# Patient Record
Sex: Female | Born: 1965
Health system: Southern US, Community
[De-identification: ages and names within clinical notes are randomized; demographics above are authoritative.]

## PROBLEM LIST (undated history)

## (undated) DIAGNOSIS — K589 Irritable bowel syndrome without diarrhea: Secondary | ICD-10-CM

## (undated) DIAGNOSIS — Z8639 Personal history of other endocrine, nutritional and metabolic disease: Secondary | ICD-10-CM

## (undated) DIAGNOSIS — D649 Anemia, unspecified: Secondary | ICD-10-CM

## (undated) DIAGNOSIS — E669 Obesity, unspecified: Secondary | ICD-10-CM

## (undated) DIAGNOSIS — E042 Nontoxic multinodular goiter: Secondary | ICD-10-CM

## (undated) DIAGNOSIS — M1990 Unspecified osteoarthritis, unspecified site: Secondary | ICD-10-CM

## (undated) DIAGNOSIS — E039 Hypothyroidism, unspecified: Secondary | ICD-10-CM

## (undated) DIAGNOSIS — R442 Other hallucinations: Secondary | ICD-10-CM

## (undated) HISTORY — DX: Other hallucinations: R44.2

## (undated) HISTORY — DX: Unspecified osteoarthritis, unspecified site: M19.90

## (undated) HISTORY — PX: BREAST SURGERY: SHX581

## (undated) HISTORY — DX: Obesity, unspecified: E66.9

## (undated) HISTORY — DX: Personal history of other endocrine, nutritional and metabolic disease: Z86.39

## (undated) HISTORY — DX: Anemia, unspecified: D64.9

## (undated) HISTORY — PX: WISDOM TOOTH EXTRACTION: SHX21

## (undated) HISTORY — PX: COLONOSCOPY: SHX174

## (undated) HISTORY — DX: Hypothyroidism, unspecified: E03.9

## (undated) HISTORY — DX: Irritable bowel syndrome, unspecified: K58.9

## (undated) HISTORY — DX: Nontoxic multinodular goiter: E04.2

---

## 1998-01-08 ENCOUNTER — Emergency Department (HOSPITAL_COMMUNITY): Admission: EM | Admit: 1998-01-08 | Discharge: 1998-01-08 | Payer: Self-pay | Admitting: Emergency Medicine

## 1998-08-19 ENCOUNTER — Emergency Department (HOSPITAL_COMMUNITY): Admission: EM | Admit: 1998-08-19 | Discharge: 1998-08-19 | Payer: Self-pay | Admitting: Emergency Medicine

## 1998-12-23 ENCOUNTER — Other Ambulatory Visit: Admission: RE | Admit: 1998-12-23 | Discharge: 1998-12-23 | Payer: Self-pay | Admitting: Obstetrics and Gynecology

## 1999-06-14 ENCOUNTER — Emergency Department (HOSPITAL_COMMUNITY): Admission: EM | Admit: 1999-06-14 | Discharge: 1999-06-14 | Payer: Self-pay

## 2000-01-15 ENCOUNTER — Encounter (HOSPITAL_BASED_OUTPATIENT_CLINIC_OR_DEPARTMENT_OTHER): Payer: Self-pay | Admitting: General Surgery

## 2000-01-16 ENCOUNTER — Ambulatory Visit (HOSPITAL_COMMUNITY): Admission: RE | Admit: 2000-01-16 | Discharge: 2000-01-16 | Payer: Self-pay | Admitting: General Surgery

## 2000-01-16 ENCOUNTER — Encounter (INDEPENDENT_AMBULATORY_CARE_PROVIDER_SITE_OTHER): Payer: Self-pay

## 2000-02-19 ENCOUNTER — Other Ambulatory Visit: Admission: RE | Admit: 2000-02-19 | Discharge: 2000-02-19 | Payer: Self-pay | Admitting: Obstetrics and Gynecology

## 2001-07-28 ENCOUNTER — Encounter (HOSPITAL_BASED_OUTPATIENT_CLINIC_OR_DEPARTMENT_OTHER): Payer: Self-pay | Admitting: General Surgery

## 2001-07-28 ENCOUNTER — Ambulatory Visit (HOSPITAL_COMMUNITY): Admission: RE | Admit: 2001-07-28 | Discharge: 2001-07-28 | Payer: Self-pay | Admitting: General Surgery

## 2002-01-23 ENCOUNTER — Ambulatory Visit (HOSPITAL_COMMUNITY): Admission: RE | Admit: 2002-01-23 | Discharge: 2002-01-23 | Payer: Self-pay | Admitting: Internal Medicine

## 2002-01-23 ENCOUNTER — Encounter: Payer: Self-pay | Admitting: Internal Medicine

## 2002-12-21 ENCOUNTER — Emergency Department (HOSPITAL_COMMUNITY): Admission: EM | Admit: 2002-12-21 | Discharge: 2002-12-22 | Payer: Self-pay | Admitting: Emergency Medicine

## 2003-10-27 ENCOUNTER — Emergency Department (HOSPITAL_COMMUNITY): Admission: EM | Admit: 2003-10-27 | Discharge: 2003-10-27 | Payer: Self-pay | Admitting: Emergency Medicine

## 2004-07-17 ENCOUNTER — Emergency Department (HOSPITAL_COMMUNITY): Admission: EM | Admit: 2004-07-17 | Discharge: 2004-07-17 | Payer: Self-pay | Admitting: Emergency Medicine

## 2005-05-16 ENCOUNTER — Emergency Department (HOSPITAL_COMMUNITY): Admission: EM | Admit: 2005-05-16 | Discharge: 2005-05-16 | Payer: Self-pay | Admitting: Emergency Medicine

## 2005-07-28 ENCOUNTER — Ambulatory Visit: Payer: Self-pay | Admitting: Internal Medicine

## 2005-08-27 ENCOUNTER — Ambulatory Visit: Payer: Self-pay | Admitting: Internal Medicine

## 2005-12-08 ENCOUNTER — Ambulatory Visit: Payer: Self-pay | Admitting: Internal Medicine

## 2006-02-08 ENCOUNTER — Ambulatory Visit: Payer: Self-pay | Admitting: Internal Medicine

## 2006-09-23 ENCOUNTER — Ambulatory Visit: Payer: Self-pay | Admitting: Internal Medicine

## 2006-11-02 ENCOUNTER — Encounter: Payer: Self-pay | Admitting: *Deleted

## 2006-11-02 DIAGNOSIS — Z9189 Other specified personal risk factors, not elsewhere classified: Secondary | ICD-10-CM | POA: Insufficient documentation

## 2006-11-02 DIAGNOSIS — E032 Hypothyroidism due to medicaments and other exogenous substances: Secondary | ICD-10-CM | POA: Insufficient documentation

## 2006-11-02 DIAGNOSIS — K589 Irritable bowel syndrome without diarrhea: Secondary | ICD-10-CM | POA: Insufficient documentation

## 2006-11-02 DIAGNOSIS — J45909 Unspecified asthma, uncomplicated: Secondary | ICD-10-CM | POA: Insufficient documentation

## 2006-12-09 ENCOUNTER — Ambulatory Visit: Payer: Self-pay | Admitting: Internal Medicine

## 2007-04-07 ENCOUNTER — Encounter: Payer: Self-pay | Admitting: Internal Medicine

## 2007-12-05 ENCOUNTER — Ambulatory Visit: Payer: Self-pay | Admitting: Internal Medicine

## 2007-12-07 ENCOUNTER — Ambulatory Visit (HOSPITAL_BASED_OUTPATIENT_CLINIC_OR_DEPARTMENT_OTHER): Admission: RE | Admit: 2007-12-07 | Discharge: 2007-12-07 | Payer: Self-pay | Admitting: Internal Medicine

## 2007-12-07 ENCOUNTER — Ambulatory Visit: Payer: Self-pay | Admitting: Internal Medicine

## 2008-03-13 ENCOUNTER — Ambulatory Visit: Payer: Self-pay | Admitting: Internal Medicine

## 2008-03-13 DIAGNOSIS — M546 Pain in thoracic spine: Secondary | ICD-10-CM | POA: Insufficient documentation

## 2008-03-13 DIAGNOSIS — M12549 Traumatic arthropathy, unspecified hand: Secondary | ICD-10-CM | POA: Insufficient documentation

## 2008-03-13 LAB — CONVERTED CEMR LAB
BUN: 11 mg/dL (ref 6–23)
Chloride: 110 meq/L (ref 96–112)
Creatinine, Ser: 0.8 mg/dL (ref 0.4–1.2)
Free T4: 0.7 ng/dL (ref 0.6–1.6)
GFR calc Af Amer: 102 mL/min
GFR calc non Af Amer: 84 mL/min
Potassium: 3.3 meq/L — ABNORMAL LOW (ref 3.5–5.1)
Sodium: 142 meq/L (ref 135–145)
TSH: 30.92 microintl units/mL — ABNORMAL HIGH (ref 0.35–5.50)

## 2008-03-14 ENCOUNTER — Telehealth: Payer: Self-pay | Admitting: Internal Medicine

## 2008-10-29 ENCOUNTER — Ambulatory Visit: Payer: Self-pay | Admitting: Internal Medicine

## 2008-10-29 LAB — CONVERTED CEMR LAB
BUN: 9 mg/dL (ref 6–23)
Calcium: 8.8 mg/dL (ref 8.4–10.5)
Glucose, Bld: 84 mg/dL (ref 70–99)

## 2008-10-30 ENCOUNTER — Telehealth: Payer: Self-pay | Admitting: Internal Medicine

## 2008-11-30 ENCOUNTER — Ambulatory Visit: Payer: Self-pay | Admitting: Internal Medicine

## 2008-12-01 ENCOUNTER — Telehealth: Payer: Self-pay | Admitting: Internal Medicine

## 2008-12-25 ENCOUNTER — Ambulatory Visit (HOSPITAL_BASED_OUTPATIENT_CLINIC_OR_DEPARTMENT_OTHER): Admission: RE | Admit: 2008-12-25 | Discharge: 2008-12-25 | Payer: Self-pay | Admitting: Internal Medicine

## 2008-12-25 ENCOUNTER — Ambulatory Visit: Payer: Self-pay | Admitting: Diagnostic Radiology

## 2009-03-28 ENCOUNTER — Ambulatory Visit: Payer: Self-pay | Admitting: Internal Medicine

## 2009-04-05 ENCOUNTER — Telehealth: Payer: Self-pay | Admitting: Internal Medicine

## 2009-05-06 ENCOUNTER — Telehealth: Payer: Self-pay | Admitting: Internal Medicine

## 2009-05-14 ENCOUNTER — Ambulatory Visit: Payer: Self-pay | Admitting: Internal Medicine

## 2009-05-14 LAB — CONVERTED CEMR LAB: TSH: 0.999 microintl units/mL (ref 0.350–4.500)

## 2009-05-17 ENCOUNTER — Telehealth: Payer: Self-pay | Admitting: Internal Medicine

## 2009-07-05 ENCOUNTER — Ambulatory Visit: Payer: Self-pay | Admitting: Internal Medicine

## 2009-12-09 ENCOUNTER — Encounter: Payer: Self-pay | Admitting: Internal Medicine

## 2009-12-09 ENCOUNTER — Ambulatory Visit: Payer: Self-pay | Admitting: Family

## 2009-12-10 ENCOUNTER — Encounter: Payer: Self-pay | Admitting: Internal Medicine

## 2009-12-10 ENCOUNTER — Telehealth: Payer: Self-pay | Admitting: Internal Medicine

## 2009-12-10 LAB — CONVERTED CEMR LAB: Gardnerella vaginalis: NEGATIVE

## 2009-12-17 ENCOUNTER — Ambulatory Visit: Payer: Self-pay | Admitting: Internal Medicine

## 2010-01-29 ENCOUNTER — Ambulatory Visit
Admission: RE | Admit: 2010-01-29 | Discharge: 2010-01-29 | Payer: Self-pay | Source: Home / Self Care | Attending: Family | Admitting: Family

## 2010-01-29 ENCOUNTER — Telehealth: Payer: Self-pay | Admitting: Family

## 2010-01-29 ENCOUNTER — Ambulatory Visit (HOSPITAL_BASED_OUTPATIENT_CLINIC_OR_DEPARTMENT_OTHER)
Admission: RE | Admit: 2010-01-29 | Discharge: 2010-01-29 | Payer: Self-pay | Source: Home / Self Care | Attending: Internal Medicine | Admitting: Internal Medicine

## 2010-02-02 ENCOUNTER — Emergency Department (HOSPITAL_COMMUNITY)
Admission: EM | Admit: 2010-02-02 | Discharge: 2010-02-03 | Payer: Self-pay | Source: Home / Self Care | Admitting: Emergency Medicine

## 2010-02-02 HISTORY — PX: BREAST CYST EXCISION: SHX579

## 2010-02-23 ENCOUNTER — Encounter: Payer: Self-pay | Admitting: Internal Medicine

## 2010-02-25 ENCOUNTER — Ambulatory Visit (INDEPENDENT_AMBULATORY_CARE_PROVIDER_SITE_OTHER)
Admission: RE | Admit: 2010-02-25 | Discharge: 2010-02-25 | Payer: 59 | Source: Home / Self Care | Attending: Internal Medicine | Admitting: Internal Medicine

## 2010-02-25 DIAGNOSIS — Z1231 Encounter for screening mammogram for malignant neoplasm of breast: Secondary | ICD-10-CM

## 2010-03-06 NOTE — Progress Notes (Signed)
Summary: lab results  Phone Note Outgoing Call   Summary of Call: call pt - thyroid function is normal.  continue same dose of thyroid medication.  return in 6 months for repeat TSH Initial call taken by: D. Thomos Lemons DO,  May 17, 2009 5:11 PM  Follow-up for Phone Call        [facility] Follow-up by: Lannette Donath,  May 20, 2009 11:58 AM  Additional Follow-up for Phone Call Additional follow up Details #1::        [facility] Diane Tomerlin  May 21, 2009 12:47 PM    Additional Follow-up for Phone Call Additional follow up Details #2::    spoke with patient and advised result Follow-up by: Roselle Locus,  May 22, 2009 8:08 AM  New/Updated Medications: SYNTHROID 125 MCG TABS (LEVOTHYROXINE SODIUM) 2 tabs by mouth once daily [BMN] Prescriptions: SYNTHROID 125 MCG TABS (LEVOTHYROXINE SODIUM) 2 tabs by mouth once daily Brand medically necessary #60 x 5   Entered and Authorized by:   D. Thomos Lemons DO   Signed by:   D. Thomos Lemons DO on 05/17/2009   Method used:   Electronically to        [facility] [phone]* (retail)       [address]       [place], [place]  [address]       Ph: [phone]       Fax: [phone]   RxID:   [id number]

## 2010-03-06 NOTE — Progress Notes (Signed)
Summary: xray result  Phone Note Outgoing Call   Summary of Call: Please call patient and let her know that her back x-ray shows mild degenerative disc in her thoracic spine.  This is what I expected.  No changes in medical plan at this point.   Initial call taken by: Lemont Fillers FNP,  January 29, 2010 4:46 PM  Follow-up for Phone Call        Left message on machine to return my call. Nicki Guadalajara Fergerson CMA Duncan Dull)  January 29, 2010 5:00 PM   Additional Follow-up for Phone Call Additional follow up Details #1::        Pt notified. Nicki Guadalajara Fergerson CMA Duncan Dull)  January 30, 2010 8:41 AM

## 2010-03-06 NOTE — Progress Notes (Signed)
Summary: Medication Problems   Phone Note Call from Patient Call back at [phone]   Caller: Patient Call For: D. Thomos Lemons DO Reason for Call: Talk to Nurse Summary of Call: Pls call  [facility] IS AT [phone] Initial call taken by: Lannette Donath,  May 06, 2009 11:23 AM  Follow-up for Phone Call        call returned to patient at [phone], no answer, voice message left informing patient call was being returned Follow-up by: Glendell Docker CMA,  May 06, 2009 1:53 PM  Additional Follow-up for Phone Call Additional follow up Details #1::        returned your call pls call [phone]  Roselle Locus  May 07, 2009 11:02 AM     Additional Follow-up for Phone Call Additional follow up Details #2::    call return to patient she states that she is currently taking 2 thyroids pills at night and she has started to feel very sluggish and would like to know if there is anything she should do different Follow-up by: Glendell Docker CMA,  May 08, 2009 9:18 AM  Additional Follow-up for Phone Call Additional follow up Details #3:: Details for Additional Follow-up Action Taken: I suggest OV Additional Follow-up by: D. Thomos Lemons DO,  May 08, 2009 11:06 AM  patient was informed per Dr Artist Pais instructions. She states she already had a follow up with Dr Artist Pais and she wanted to know if there were any changes in her medication. She was informed no changes were made, Dr Artist Pais you advises office visit. Patient verbalized understanding and agrees Glendell Docker CMA  May 08, 2009 1:41 PM

## 2010-03-06 NOTE — Assessment & Plan Note (Signed)
Summary: 6 month follow up/mhf rsc with pt/mhf   Vital Signs:  Patient profile:   45 year old female Height:      67 inches Weight:      272 pounds BMI:     42.76 O2 Sat:      99 % on Room air Temp:     98.2 degrees F oral Pulse rate:   108 / minute Pulse rhythm:   irregular Resp:     18 per minute BP sitting:   130 / 90  (right arm) Cuff size:   regular  Vitals Entered By: Glendell Docker CMA (May 14, 2009 3:24 PM)  O2 Flow:  Room air CC: Rm 3- 6 Month follow up disease  management   Primary Care Provider:  DThomos Lemons DO  CC:  Rm 3- 6 Month follow up disease  management.  History of Present Illness:  45 y/o AA female for hypothyroidism for f/u prev TSH elevated pt notes good med compliance she denies missing doses currently taking 125 micrograms 2 in the am  ,  150 two times a day made her fell sluggish, so she stopped taking them and went back to 2 tablest of 125 in the am    Allergies: 1)  ! Sulfa  Past History:  Past Medical History: History of Multinodular Goiter S/P radioactive I131 Ablation Iatogenic HYPOTHYROIDISM (ICD-244.9) IRRITABLE BOWEL SYNDROME (ICD-564.1) Childhood ASTHMA (ICD-493.90)   BREAST BIOPSY, HX OF (ICD-V15.9)      Family History: Twin brother has sarcoidosis Family History Diabetes 1st degree relative        Social History: Single Never Smoked Alcohol use-no   Occupation: Works @ [employer]  General:  alert, well-developed, and well-nourished.   Neck:  supple and no masses.   Lungs:  normal respiratory effort and normal breath sounds.   Heart:  normal rate, regular rhythm, and no gallop.   Neurologic:  cranial nerves II-XII intact and gait normal.   Psych:  normally interactive and good eye contact.     Impression & Recommendations:  Problem # 1:  HYPOTHYROIDISM (ICD-244.9) Unclear whether poor absortion or compliance issue causing elevated TSH.   I stressed compliance. take at bedtime on empty  stomach repeat TFTs in 2 months  Complete Medication List: 1)  Synthroid 125 Mcg Tabs (Levothyroxine sodium) .... 2 tabs by mouth once daily 2)  Cyclobenzaprine Hcl 5 Mg Tabs (Cyclobenzaprine hcl) .... One by mouth at bedtime prn  Other Orders: T-TSH ([id number]) T-T4, Free [phone])  Patient Instructions: 1)  Please schedule a follow-up appointment in 6 months.  Current Allergies (reviewed today): ! SULFA

## 2010-03-06 NOTE — Assessment & Plan Note (Signed)
Summary: pap/mhf rsch per pt/dt--Rm 5   Vital Signs:  Patient profile:   45 year old female Menstrual status:  regular LMP:     12/08/2009 Height:      67 inches Weight:      272.75 pounds BMI:     42.87 Temp:     99.1 degrees F oral Pulse rate:   106 / minute Pulse rhythm:   regular Resp:     18 per minute BP sitting:   130 / 90  (right arm) Cuff size:   large  Vitals Entered By: Mervin Kung CMA Duncan Dull) (December 09, 2009 10:03 AM) CC: Rm 5  Pt here for pap smear and thyroid  check. Is Patient Diabetic? No Pain Assessment Patient in pain? no      Comments Pt states she had more menstrual cramping than usual last week. Nicki Guadalajara Fergerson CMA Duncan Dull)  December 09, 2009 10:09 AM  LMP (date): 12/08/2009     Menstrual Status regular Enter LMP: 12/08/2009   Primary Care Winta Barcelo:  D. Thomos Lemons DO  CC:  Rm 5  Pt here for pap smear and thyroid  check..  History of Present Illness: Ms.  Ploch is a 45 year old female patient of Dr. Olegario Messier who presents today for her pap smear.   1.  GYN-  Last cycle lasted 3 days, more cramping than usual.  Passed a "large" clot which is unusual for her.  Has never been sexually active.  Last pap was 4 years ago.  She does c/o vaginal odor, some brownish discharge noted last week prior to her period.  She is also due for a mammogram.    2. Hypothyroid-  Last lab draw TSH was within normal limits.  (4/11)  Allergies: 1)  ! Sulfa  Past History:  Past Medical History: Last updated: 05/14/2009 History of Multinodular Goiter S/P radioactive I131 Ablation Iatogenic HYPOTHYROIDISM (ICD-244.9) IRRITABLE BOWEL SYNDROME (ICD-564.1) Childhood ASTHMA (ICD-493.90)   BREAST BIOPSY, HX OF (ICD-V15.9)      Past Surgical History: Last updated: 10/29/2008 Right Breast Biopsy History of surgery for Uterine Fibroids    Review of Systems       see HPI  Physical Exam  General:  Obese AA female, awake, alert and in NAD Breasts:  No mass,  nodules, thickening, tenderness, bulging, retraction, inflamation, nipple discharge or skin changes noted.  + scar on right breast at 10 oclock Lungs:  Normal respiratory effort, chest expands symmetrically. Lungs are clear to auscultation, no crackles or wheezes. Heart:  Normal rate and regular rhythm. S1 and S2 normal without gallop, murmur, click, rub or other extra sounds. Abdomen:  Bowel sounds positive,abdomen soft and non-tender without masses, organomegaly or hernias noted. Genitalia:  Pelvic Exam:        External: normal female genitalia without lesions or masses        Vagina:narrow introitus without lesions or masses, blood tinged mucous noted        Cervix: not visualized        Adnexa: normal bimanual exam without masses or fullness        Uterus: normal by palpation        Pap smear: not performed   Impression & Recommendations:  Problem # 1:  ROUTINE GYNECOLOGICAL EXAMINATION (ICD-V72.31) Assessment Comment Only  Attempted Pap smear on this virginal female without success due to extreme patient discomfort- speculum was passed, however unable to open speculum enough to bring cervix into view as patient was  very tense and was extremely uncomfomfortable during exam.  Discussed with patient.  Will refer to GYN- they may be able to perform successfully with an extra small speculum.  A wet prep was performed today to evaluate patients complaint of vaginal odor.  Patient was given order for Mammogram to complete downstairs.  Orders: Specimen Handling ([id number]) T-Wet Prep by [facility] [phone]) Gynecologic Referral (Gyn)  Problem # 2:  HYPOTHYROIDISM (ICD-244.9) Assessment: Comment Only Will send for TSH. Her updated medication list for this problem includes:    Synthroid 125 Mcg Tabs (Levothyroxine sodium) .Marland Kitchen... 2 tabs by mouth once daily  Complete Medication List: 1)  Synthroid 125 Mcg Tabs (Levothyroxine sodium) .... 2 tabs by mouth once daily 2)  Cyclobenzaprine  Hcl 5 Mg Tabs (Cyclobenzaprine hcl) .... One by mouth at bedtime prn  Other Orders: Mammogram (Screening) (Mammo) T- * Misc. Laboratory test [phone])  Patient Instructions: 1)  You will be contacted about your apt with GYN. 2)  Please complete schedule your mammogram downstairs. 3)  Please complete your lab work downstairs today. 4)  Follow up with Dr. Artist Pais in 3 months.   Orders Added: 1)  Mammogram (Screening) [Mammo] 2)  Specimen Handling [99000] 3)  T-Wet Prep by [facility] [[id number]] 4)  T- * Misc. Laboratory test [99999] 5)  Est. Patient Level III [29518] 6)  Gynecologic Referral [Gyn]    Current Allergies (reviewed today): ! SULFA

## 2010-03-06 NOTE — Assessment & Plan Note (Signed)
Summary: BACK PAIN/MHF--Rm 5   Vital Signs:  Patient profile:   45 year old female Menstrual status:  regular Height:      67 inches Weight:      274.75 pounds BMI:     43.19 O2 Sat:      100 % on Room air Temp:     97.9 degrees F oral Pulse rate:   84 / minute Pulse rhythm:   regular Resp:     18 per minute BP sitting:   130 / 80  (right arm) Cuff size:   large  Vitals Entered By: Mervin Kung CMA Duncan Dull) (January 29, 2010 2:31 PM)  O2 Flow:  Room air CC: Pt states she has had mid back pain x 1 week. Some relief  with otc Ibuprofen. Pain resumes when medication wears off., Back Pain Is Patient Diabetic? No Pain Assessment Patient in pain? yes     Location: mid back pain Intensity: 9 Type: cramping Onset of pain  1 week Comments Pt has been taking Ibuprofen 200mg  1 four times a day. Nicki Guadalajara Fergerson CMA Duncan Dull)  January 29, 2010 2:40 PM    CC:  Pt states she has had mid back pain x 1 week. Some relief  with otc Ibuprofen. Pain resumes when medication wears off. and Back Pain.  Back Pain History:      The patient's back pain started approximately 01/22/2010.  The pain is located in the lower back region and does not radiate below the knees.  On a scale of 1-10, she describes the pain as a 4.  She states that she has had a prior history of back pain.  The patient has not had any recent physical therapy for her back pain.  The following makes the back pain better: Ibuprofen.  The following makes the back pain worse: Twisting movement.        Description of injury in patient's own words:  Denies know injury.  Works in lab- always moves.   .    Critical Exclusionary Diagnosis Criteria (CEDC) for Back Pain:      The patient denies a history of previous trauma.     Allergies: 1)  ! Sulfa  Review of Systems       see history of present illness  Physical Exam  General:  overweight African American female pleasant, awake, alert, and in no acute distress Neck:  No  deformities, masses, or tenderness noted. Lungs:  Normal respiratory effort, chest expands symmetrically. Lungs are clear to auscultation, no crackles or wheezes. Heart:  Normal rate and regular rhythm. S1 and S2 normal without gallop, murmur, click, rub or other extra sounds.   Detailed Back/Spine Exam  Thoracic Exam:  Inspection-deformity:    Normal Palpation-spinal tenderness:  Abnormal    Location:  T10-T11   Impression & Recommendations:  Problem # 1:  BACK PAIN, THORACIC REGION, RIGHT (ICD-724.1) Assessment Deteriorated Will order plain film to further evaluate. And give patient a trial of Mobic. Patient to followup with Dr. Artist Pais in one month. Her updated medication list for this problem includes:    Meloxicam 7.5 Mg Tabs (Meloxicam) ..... One tablet by mouth once daily as needed for back pain  Orders: T-DG Thoracic Spine (86578)  Complete Medication List: 1)  Levothyroxine Sodium 100 Mcg Tabs (Levothyroxine sodium) .Marland Kitchen.. 1 tab as directed 2)  Meloxicam 7.5 Mg Tabs (Meloxicam) .... One tablet by mouth once daily as needed for back pain  Other Orders: Urine Pregnancy Test  [phone])  Patient Instructions: 1)  Please complete your x-ray downstairs. 2)  Stop motrin/ibuprofen. Start Mobic. 3)  Most patients (90%) with low back pain will improve with time (2-6 weeks). Keep active but avoid activities that are painful. Apply moist heat and/or ice to lower back several times a day. 4)  Follow up with Dr. Artist Pais in 1 month. Prescriptions: MELOXICAM 7.5 MG TABS (MELOXICAM) one tablet by mouth once daily as needed for back pain  #15 x 0   Entered and Authorized by:   Lemont Fillers FNP   Signed by:   Lemont Fillers FNP on 01/29/2010   Method used:   Electronically to        [facility] [phone]* (retail)       [address]       [place], [place]  [address]       Ph: [phone]       Fax: [phone]   RxID:   [id number]    Orders Added: 1)  T-DG  Thoracic Spine [72070] 2)  Urine Pregnancy Test  [81025] 3)  Est. Patient Level III [24401]    Current Allergies (reviewed today): ! SULFA   Laboratory Results   Urine Tests   Date/Time Reported: Mervin Kung CMA Duncan Dull)  January 29, 2010 3:21 PM     Urine HCG: negative

## 2010-03-06 NOTE — Assessment & Plan Note (Signed)
Summary: THYROID CHECK/DK   Vital Signs:  Patient profile:   45 year old female Menstrual status:  regular Height:      67 inches Weight:      275 pounds BMI:     43.23 O2 Sat:      97 % on Room air Temp:     98.1 degrees F oral Pulse rate:   91 / minute Resp:     18 per minute BP sitting:   130 / 80  (right arm) Cuff size:   large  Vitals Entered By: Glendell Docker CMA (December 17, 2009 2:32 PM)  O2 Flow:  Room air CC: follow-up visit Is Patient Diabetic? No Pain Assessment Patient in pain? no      Comments discuss thyroid condition   Primary Care Provider:  Dondra Spry DO  CC:  follow-up visit.  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 Sulfa  Past History:  Past Medical History: History of Multinodular Goiter S/P radioactive I131 Ablation  Iatogenic HYPOTHYROIDISM (ICD-244.9) IRRITABLE BOWEL SYNDROME (ICD-564.1) Childhood ASTHMA (ICD-493.90)   BREAST BIOPSY, HX OF (ICD-V15.9)       Past Surgical History: Right Breast Biopsy  History of surgery for Uterine Fibroids     Family History: Twin brother has sarcoidosis Family History Diabetes 1st degree relative         Social History: Single Never Smoked  Alcohol use-no   Occupation: Works @ [employer]  General:  alert, well-developed, and well-nourished.   Lungs:  normal respiratory effort and normal breath sounds.   Heart:  normal rate, regular rhythm, and no gallop.   Extremities:  No lower extremity edema   Impression & Recommendations:  Problem # 1:  HYPOTHYROIDISM (ICD-244.9) most recent TSH elevated >50 pt with sporadic compliance medication cost is an issue change to generic levothyroxine arrange TSH in 2 months  Her  updated medication list for this problem includes:    Levothyroxine Sodium 100 Mcg Tabs (Levothyroxine sodium) .Marland Kitchen... 1 tab as directed  Labs Reviewed: TSH: 0.999 (05/14/2009)     Complete Medication List: 1)  Levothyroxine Sodium 100 Mcg Tabs (Levothyroxine sodium) .Marland Kitchen.. 1 tab as directed  Patient Instructions: 1)  Please schedule a follow-up appointment in 4 months. 2)  TSH prior to visit, ICD-9:  244.90 3)  Please return for lab work in 2 months Prescriptions: LEVOTHYROXINE SODIUM 100 MCG TABS (LEVOTHYROXINE SODIUM) 1 tab as directed  #90 x 1   Entered and Authorized by:   D. Thomos Lemons DO   Signed by:   D. Thomos Lemons DO on 12/17/2009   Method used:   Electronically to        [facility] [phone]* (retail)       [address]       [place], [place]  [address]       Ph: [phone]       Fax: [phone]   RxID:   [id number]    Orders Added: 1)  Est. Patient Level II [56387]    Current Allergies (reviewed today): ! SULFA     Appended Document: Orders Update    Clinical Lists Changes  Orders: Added new Service order of Flu Vaccine 2yrs + (56433) -  Signed Added new Service order of Admin 1st Vaccine (60454) - Signed Observations: Added new observation of FLU VAX#1VIS: 08/27/09 version given December 17, 2009. (12/17/2009 14:58) Added new observation of FLU VAXLOT: [id number] (12/17/2009 14:58) Added new observation of FLU VAX EXP: 08/02/2010 (12/17/2009 14:58) Added new observation of FLU VAXBY: Darlene Knight CMA (12/17/2009 14:58) Added new observation of FLU VAXRTE: IM (12/17/2009 14:58) Added new observation of FLU VAX DSE: 0.5 ml (12/17/2009 14:58) Added new observation of FLU VAXMFR: GlaxoSmithKline (12/17/2009 14:58) Added new observation of FLU VAX SITE: left deltoid (12/17/2009 14:58) Added new observation of FLU VAX: Fluvax 3+ (12/17/2009 14:58)       Immunizations Administered:  Influenza Vaccine # 1:    Vaccine Type: Fluvax 3+     Site: left deltoid    Mfr: GlaxoSmithKline    Dose: 0.5 ml    Route: IM    Given by: Glendell Docker CMA    Exp. Date: 08/02/2010    Lot #: [id number]    VIS given: 08/27/09 version given December 17, 2009.  Flu Vaccine Consent Questions:    Do you have a history of severe allergic reactions to this vaccine? no    Any prior history of allergic reactions to egg and/or gelatin? no    Do you have a sensitivity to the preservative Thimersol? no    Do you have a past history of Guillan-Barre Syndrome? no    Do you currently have an acute febrile illness? no    Have you ever had a severe reaction to latex? no    Vaccine information given and explained to patient? yes    Are you currently pregnant? no

## 2010-03-06 NOTE — Progress Notes (Signed)
Summary: Thyroid Status pt states she is taking her meds as directed   Phone Note Outgoing Call   Call placed by: Glendell Docker CMA,  April 05, 2009 5:15 PM Call placed to: Patient Summary of Call: attempted to contact patient at [phone], patients daughter stated patient was not home. Message left for patient to return phone call first thing Monday morning.  Patients TSH is elevated  42.200 and we need to find out if she has been taking her medication on a regular basis. Initial call taken by: Glendell Docker CMA,  April 05, 2009 5:18 PM  Follow-up for Phone Call        patient called she states she is taking her meds as directed. [phone] is a number you can reach her today Roselle Locus  April 08, 2009 8:23 AM  Additional Follow-up for Phone Call Additional follow up Details #1::        see rx for higher dose.  take thyroid medication at night.  she needs TSH in 1 month  244.9 Additional Follow-up by: D. Thomos Lemons DO,  April 08, 2009 1:48 PM    Additional Follow-up for Phone Call Additional follow up Details #2::    attempted to contact patient at [phone], no answer, detailed voice message left informing patient oer Dr Artist Pais instructions. Lab order has been enetered for 4/4/ 2011 at the [facility]. Follow-up by: Glendell Docker CMA,  April 08, 2009 2:01 PM  New/Updated Medications: LEVOTHROID 150 MCG TABS (LEVOTHYROXINE SODIUM) 2 tabs by mouth qpm Prescriptions: LEVOTHROID 150 MCG TABS (LEVOTHYROXINE SODIUM) 2 tabs by mouth qpm  #60 x 2   Entered and Authorized by:   D. Thomos Lemons DO   Signed by:   D. Thomos Lemons DO on 04/08/2009   Method used:   Electronically to        [facility] [phone]* (retail)       [address]       [place], [place]  [address]       Ph: [phone]       Fax: [phone]   RxID:   [id number]

## 2010-03-06 NOTE — Progress Notes (Signed)
Summary: Thyroid  ---- Converted from flag ---- ---- 12/10/2009 4:39 PM, D. Thomos Lemons DO wrote: what other pharm is she using?  ---- 12/10/2009 4:00 PM, Glendell Docker CMA wrote: Sherron Monday with the pharmacy tech at [facility], she states last refill was in July and patient transferred the medication in September.  ---- 12/10/2009 2:41 PM, D. Thomos Lemons DO wrote: please call her pharm and find out whether pt has been refilling her thyroid medication regularly ------------------------------  Phone Note Outgoing Call   Call placed by: Glendell Docker CMA,  December 10, 2009 5:03 PM Call placed to: Patient Summary of Call: call placed to she states that she has been taking her medication on a regular basis. She was informed that Dr Artist Pais would like to see her back in the office to discuss her thyroid and review medication with her. Patient has scheduled a follow up appointment for Tuesday 11/15 @ 2:30pm with Dr Artist Pais Initial call taken by: Glendell Docker CMA,  December 10, 2009 5:04 PM

## 2010-03-13 ENCOUNTER — Ambulatory Visit (INDEPENDENT_AMBULATORY_CARE_PROVIDER_SITE_OTHER): Payer: 59 | Admitting: Internal Medicine

## 2010-03-13 ENCOUNTER — Encounter: Payer: Self-pay | Admitting: Internal Medicine

## 2010-03-13 ENCOUNTER — Ambulatory Visit (INDEPENDENT_AMBULATORY_CARE_PROVIDER_SITE_OTHER)
Admission: RE | Admit: 2010-03-13 | Discharge: 2010-03-13 | Disposition: A | Discharge status: Home / Self Care | Payer: 59 | Source: Ambulatory Visit | Attending: Internal Medicine | Admitting: Internal Medicine

## 2010-03-13 ENCOUNTER — Other Ambulatory Visit: Payer: Self-pay | Admitting: Internal Medicine

## 2010-03-13 ENCOUNTER — Ambulatory Visit (HOSPITAL_BASED_OUTPATIENT_CLINIC_OR_DEPARTMENT_OTHER)
Admission: RE | Admit: 2010-03-13 | Discharge: 2010-03-13 | Disposition: A | Discharge status: Home / Self Care | Payer: 59 | Source: Ambulatory Visit | Attending: Internal Medicine | Admitting: Internal Medicine

## 2010-03-13 DIAGNOSIS — M79609 Pain in unspecified limb: Secondary | ICD-10-CM | POA: Insufficient documentation

## 2010-03-13 DIAGNOSIS — IMO0002 Reserved for concepts with insufficient information to code with codable children: Secondary | ICD-10-CM | POA: Insufficient documentation

## 2010-03-13 DIAGNOSIS — M25469 Effusion, unspecified knee: Secondary | ICD-10-CM | POA: Insufficient documentation

## 2010-03-13 DIAGNOSIS — M79604 Pain in right leg: Secondary | ICD-10-CM

## 2010-03-13 DIAGNOSIS — M1710 Unilateral primary osteoarthritis, unspecified knee: Secondary | ICD-10-CM | POA: Insufficient documentation

## 2010-03-13 DIAGNOSIS — E039 Hypothyroidism, unspecified: Secondary | ICD-10-CM

## 2010-03-14 ENCOUNTER — Telehealth: Payer: Self-pay | Admitting: Internal Medicine

## 2010-03-14 ENCOUNTER — Encounter: Payer: Self-pay | Admitting: Internal Medicine

## 2010-03-17 ENCOUNTER — Encounter (INDEPENDENT_AMBULATORY_CARE_PROVIDER_SITE_OTHER): Payer: Self-pay | Admitting: *Deleted

## 2010-03-20 ENCOUNTER — Telehealth: Payer: Self-pay | Admitting: Internal Medicine

## 2010-03-22 ENCOUNTER — Encounter: Payer: Self-pay | Admitting: Internal Medicine

## 2010-03-26 NOTE — Letter (Signed)
Summary: Primary Care Consult Scheduled Letter  [place] [facility]  [address]   [place], [place] [address]   Phone: [phone]  Fax: [phone]      03/17/2010 MRN: [id number]  [facility] [address] [place], [place]  [address]  [place]    Dear Ms. Murdaugh,      We have scheduled an appointment for you.  At the recommendation of Dr.YOO, we have scheduled you a consult with DR GIOFFRE,[facility]  on SATURDAY FEBRUARY 18,2012 at 10:30AM.  Their address is_[address], [place] . The office phone number is [phone].  If this appointment day and time is not convenient for you, please feel free to call the office of the doctor you are being referred to at the number listed above and reschedule the appointment.     It is important for you to keep your scheduled appointments. We are here to make sure you are given good patient care.    Thank you,  Darral Dash Patient Care Coordinator [place] at [facility]

## 2010-03-26 NOTE — Progress Notes (Signed)
Summary: Test Results  Phone Note Outgoing Call   Summary of Call: call pt - leg u/s negative for blood clot.  it showed bakers cyst.  (benign synovial cyst) knee xray showed arthritis. If pt having persistent leg pain, I suggest referral to ortho  I doubt symptoms from restless leg.  ok to stop gabapentin Initial call taken by: D. Thomos Lemons DO,  March 14, 2010 1:36 PM  Follow-up for Phone Call        call placed to patient at [phone], no answer. A detailed voice message was left informing patient per Dr Artist Pais instructions. Message was left for patient to return call if any questions Follow-up by: Glendell Docker CMA,  March 14, 2010 2:04 PM

## 2010-03-26 NOTE — Progress Notes (Signed)
Summary: Lab Results  Phone Note Outgoing Call   Call placed by: Glendell Docker CMA,  March 20, 2010 8:21 AM Call placed to: Patient Summary of Call: call placed to patient at [phone], no answer, A detailed voice message was left for patient to return call regarding lab results. Need to verify if patient is taking her Thyroid medication on a regaular basis. TSH is elevated at 67.520 Initial call taken by: Glendell Docker CMA,  March 20, 2010 8:23 AM  Follow-up for Phone Call        call placed to patient at [phone], no answer. A detailed voice message was left for patient informing patient TSH was elevated. Message left for patient to return call to inform if she is taking her thyroid medication, and if so; is she taking it on a regular basis. Follow-up by: Glendell Docker CMA,  March 21, 2010 9:12 AM  Additional Follow-up for Phone Call Additional follow up Details #1::        patient returned phone call and stated that she has been taking her medication faithfully once in the morning with the Glucasamine. She denies missing any doses. Additional Follow-up by: Glendell Docker CMA,  March 21, 2010 4:04 PM    Additional Follow-up for Phone Call Additional follow up Details #2::    I suggest pt take thyroid medication on empty stomach with water only wait 2 hrs before eating breakfast If this is not possible,  take thyroid medication at bedtime as long as she does not eat within 2 hrs of bedtime. see higher dose of thyroid medication plz schedule repeat TSH in 2 months  Follow-up by: D. Thomos Lemons DO,  March 21, 2010 4:28 PM  Additional Follow-up for Phone Call Additional follow up Details #3:: Details for Additional Follow-up Action Taken: call placed to patient at [phone], no answer. A detailed voice message was left informing patient per Dr Artist Pais instructions. She was advised to return in April for repeat TSH. Message was left for patient to return phone call if any  questions. Additional Follow-up by: Glendell Docker CMA,  March 21, 2010 4:41 PM  New/Updated Medications: LEVOTHYROXINE SODIUM 150 MCG TABS (LEVOTHYROXINE SODIUM) one by mouth once daily as directed Prescriptions: LEVOTHYROXINE SODIUM 150 MCG TABS (LEVOTHYROXINE SODIUM) one by mouth once daily as directed  #30 x 3   Entered and Authorized by:   D. Thomos Lemons DO   Signed by:   D. Thomos Lemons DO on 03/21/2010   Method used:   Electronically to        [facility] [phone]* (retail)       [address]       [place], [place]  [address]       Ph: [phone]       Fax: [phone]   RxID:   [id number]

## 2010-04-01 NOTE — Assessment & Plan Note (Signed)
Summary: 2 month follow up/mhf   Vital Signs:  Patient profile:   45 year old female Menstrual status:  regular Height:      67 inches Weight:      272.50 pounds BMI:     42.83 O2 Sat:      100 % on Room air Temp:     99.1 degrees F oral Pulse rate:   85 / minute Resp:     18 per minute BP sitting:   122 / 80  (right arm) Cuff size:   large  Vitals Entered By: Glendell Docker CMA (March 13, 2010 3:38 PM)  O2 Flow:  Room air CC: 2 Monrh follow up  Is Patient Diabetic? No Pain Assessment Patient in pain? no        Primary Care Provider:  Dondra Spry DO  CC:  2 Monrh follow up .  History of Present Illness: 46 y/o female c/o bilateral leg pain, throbbing sensation worse at night when she is sleeping, has started wearing support hose with some relief, also has a popping sensation in her knees.  hypothyroidism - pt denies poor compliance  Preventive Screening-Counseling & Management  Alcohol-Tobacco     Smoking Status: never  Allergies: 1)  ! Sulfa  Past History:  Past Medical History: History of Multinodular Goiter S/P radioactive I131 Ablation  Iatogenic HYPOTHYROIDISM (ICD-244.9) IRRITABLE BOWEL SYNDROME (ICD-564.1)  Childhood ASTHMA (ICD-493.90)   BREAST BIOPSY, HX OF (ICD-V15.9)       Past Surgical History: Right Breast Biopsy  History of surgery for Uterine Fibroids       Physical Exam  General:  alert and overweight-appearing.   Lungs:  Normal respiratory effort, chest expands symmetrically. Lungs are clear to auscultation, no crackles or wheezes. Heart:  Normal rate and regular rhythm. S1 and S2 normal without gallop, murmur, click, rub or other extra sounds. Msk:  prominent popliteal fossa Extremities:  trace left pedal edema and trace right pedal edema.       Impression & Recommendations:  Problem # 1:  LEG PAIN, RIGHT (ICD-729.5) rule out DVT consider RLS  -  trial of gabapentin  Orders: LE Venous Duplex (DVT) (DVT) T-CBC No  Diff (04540-98119) T-Iron Binding Capacity (TIBC) ([address]) Augusto Gamble ([address]86-57846) T-Ferritin (96295-28413) T-Knee Right 2 view (73560TC)  Problem # 2:  HYPOTHYROIDISM (ICD-244.9)  Her updated medication list for this problem includes:    Levothyroxine Sodium 150 Mcg Tabs (Levothyroxine sodium) ..... One by mouth once daily as directed  Orders: T-TSH (24401-02725)  Complete Medication List: 1)  Levothyroxine Sodium 150 Mcg Tabs (Levothyroxine sodium) .... One by mouth once daily as directed  Patient Instructions: 1)  Please schedule a follow-up appointment in 1 month. Prescriptions: GABAPENTIN 100 MG CAPS (GABAPENTIN) one to two caps at bedtime for leg pains  #60 x 0   Entered and Authorized by:   D. Thomos Lemons DO   Signed by:   D. Thomos Lemons DO on 03/13/2010   Method used:   Electronically to        [facility] [phone]* (retail)       [address]       [place], [place]  [address]       Ph: [phone]       Fax: [phone]   RxID:   [id number]    Orders Added: 1)  LE Venous Duplex (DVT) [DVT] 2)  T-CBC No Diff [[address]27[address]10000] 3)  T-Iron Binding Capacity (TIBC) [[address]] 4)  Augusto Gamble [54270-62376]  5)  T-Ferritin [82728-23350] 6)  T-TSH [16109-60454] 7)  T-Knee Right 2 view [73560TC] 8)  Est. Patient Level III [[address]11]    Current Allergies (reviewed today): ! SULFA

## 2010-04-10 NOTE — Consult Note (Signed)
Summary: [facility]   Imported By: Maryln Gottron 04/02/2010 15:41:43  _____________________________________________________________________  External Attachment:    Type:   Image     Comment:   External Document

## 2010-04-14 ENCOUNTER — Ambulatory Visit: Payer: 59 | Admitting: Internal Medicine

## 2010-04-17 ENCOUNTER — Ambulatory Visit: Payer: Self-pay | Admitting: Internal Medicine

## 2010-05-06 ENCOUNTER — Encounter: Payer: Self-pay | Admitting: Family

## 2010-05-07 ENCOUNTER — Ambulatory Visit (INDEPENDENT_AMBULATORY_CARE_PROVIDER_SITE_OTHER): Payer: 59 | Admitting: Family

## 2010-05-07 ENCOUNTER — Encounter: Payer: Self-pay | Admitting: Family

## 2010-05-07 DIAGNOSIS — K5900 Constipation, unspecified: Secondary | ICD-10-CM | POA: Insufficient documentation

## 2010-05-07 NOTE — Progress Notes (Signed)
  Subjective:    Patient ID: [name], female    DOB: 1965-07-20, 45 y.o.   MRN: [id number]  HPI  [name] is a 45 yr old female who presents today with chief complaint of rectal fullness.  Symptoms started on Friday 3/30 after working out on an exercise bike.  Denies pmhx of hemorroids.  Notes + rectal tenderness with squeezing.  Has used preparation H on Sunday and Monday without improvement in her symptoms.    Review of Systems  Gastrointestinal: Positive for abdominal distention and rectal pain. Negative for blood in stool.       Objective:   Physical Exam  Constitutional: She appears well-developed and well-nourished.  Cardiovascular: Normal rate and regular rhythm.   Pulmonary/Chest: Effort normal and breath sounds normal.  Abdominal: Soft. Bowel sounds are normal.       Normal rectal exam- no visible external hemorrhoids.  No palpable internal hemorrhoids.  + hard stool noted in rectal vault.           Assessment & Plan:

## 2010-05-07 NOTE — Patient Instructions (Signed)
Eat plenty of fresh fruits and veggies and make sure that you are drinking 6-8 glasses of water a day. Use miralax once daily as needed to achieve one soft BM a day. Call if symptoms worsen or do not improve.

## 2010-05-07 NOTE — Assessment & Plan Note (Signed)
Symptoms most consistent with constipation.  Recommended that she start Miralax once daily and then taper to an as needed regimen to maintain one soft BM/day.  Increase fiber and H20 intake.  Pt verbalizes understanding.

## 2010-05-29 ENCOUNTER — Encounter: Payer: Self-pay | Admitting: Internal Medicine

## 2010-05-29 ENCOUNTER — Ambulatory Visit (INDEPENDENT_AMBULATORY_CARE_PROVIDER_SITE_OTHER): Payer: 59 | Admitting: Internal Medicine

## 2010-05-29 DIAGNOSIS — E039 Hypothyroidism, unspecified: Secondary | ICD-10-CM

## 2010-05-29 DIAGNOSIS — R209 Unspecified disturbances of skin sensation: Secondary | ICD-10-CM

## 2010-05-29 DIAGNOSIS — R202 Paresthesia of skin: Secondary | ICD-10-CM

## 2010-05-29 LAB — T4, FREE: Free T4: 0.93 ng/dL (ref 0.80–1.80)

## 2010-05-29 LAB — TSH: TSH: 23.279 u[IU]/mL — ABNORMAL HIGH (ref 0.350–4.500)

## 2010-05-30 ENCOUNTER — Telehealth: Payer: Self-pay | Admitting: Internal Medicine

## 2010-05-30 LAB — HEMOGLOBIN A1C: Mean Plasma Glucose: 117 mg/dL — ABNORMAL HIGH (ref <117)

## 2010-05-30 LAB — RPR

## 2010-05-30 MED ORDER — LEVOTHYROXINE SODIUM 200 MCG PO TABS: 200.0000 ug | ORAL_TABLET | Freq: Every day | ORAL | Status: DC

## 2010-05-30 NOTE — Telephone Encounter (Signed)
Call placed to patient at [phone], no answer.  A detailed voice message was left informing patient per Dr Artist Pais instructions

## 2010-05-30 NOTE — Telephone Encounter (Signed)
Call pt - TSH improving.  I suggest we increase dose of thyroid medication.  See new rx Other blood tests - b12 , A1c and RPR are normal

## 2010-06-20 NOTE — Op Note (Signed)
[place]. [facility]  Patient:    [name], [name]                     MRN: [id number] Proc. Date: 01/16/00 Adm. Date:  [id number] Attending:  Sonda Primes                           Operative Report  PREOPERATIVE DIAGNOSIS:  Fibroadenoma, right breast.  POSTOPERATIVE DIAGNOSIS:  Fibroadenoma, right breast.  OPERATION PERFORMED:  Excisional biopsy, giant fibroadenoma of right breast.  SURGEON:  Mardene Celeste. Lurene Shadow, M.D.  ASSISTANT:  Nurse.  ANESTHESIA:  General.  INDICATIONS FOR PROCEDURE:  The patient is a 45 year old female presenting with a right-sided breast mass which on mammogram and ultrasound showed a solid multilobulated tumor which was suspicious.  On physical evaluation, this mass is large but freely mobile.  She was brought to the operating room now for excision.  DESCRIPTION OF PROCEDURE:  Following induction of anesthesia, the patient was positioned supinely and the right breast prepped and draped to be included in a sterile operative field.  A curvilinear incision in the upper outer quadrant was deepened through the skin and subcutaneous tissues, taken down to the capsule of the mass.  The mass was very large and multilobulated, dissected free on all sides, removed in its entirety and forwarded for pathologic evaluation.  Hemostasis was obtained with electrocautery.  Sponge, instrument and sharp counts were verified.  Subcutaneous tissues were reapproximated with 3-0 Vicryl and the skin closed with Dermabond.  The patient was removed from the operating room to the recovery room in stable condition having tolerated the procedure well. DD:  01/16/00 TD:  01/16/00 Job: [id number]

## 2010-06-23 DIAGNOSIS — R202 Paresthesia of skin: Secondary | ICD-10-CM | POA: Insufficient documentation

## 2010-06-23 NOTE — Assessment & Plan Note (Signed)
Pt c/o numbness of bottoms of her feet.  Question early peripheral neuropathy.   It may be consequence of hypothyroidism. Rule out other causes  Check a1c and B12

## 2010-06-23 NOTE — Assessment & Plan Note (Signed)
TSH slowly improving.  Question poor absorption of thyroid medication. Pt advised to not cook AM meal in iron cast skillet.  Increase levothyroxine dose to 200 mcg  Lab Results  Component Value Date   TSH 23.279* 05/29/2010

## 2010-06-23 NOTE — Progress Notes (Signed)
  Subjective:    Patient ID: [name], female    DOB: [birth year], 45 y.o.   MRN: [id number]  HPI  45 y/o AA female with hx of hypothyroidism for f/u.  Pt reports taking her meds regularly but TSH has been persistently elevated.  She denies taking thyroid med with antiacids or vitamins  She also c/o numb sensation bottoms of her feet.   Review of Systems No weight change.  She denies fatigue    Past Medical History  Diagnosis Date  . Multinodular goiter     s/p radioactive I131 ablation, Iatogenic Hypothyroidism  . IBS (irritable bowel syndrome)   . Asthma     childhood    History   Social History  . Marital Status: Single    Spouse Name: N/A    Number of Children: N/A  . Years of Education: N/A   Occupational History  . Not on file.   Social History Main Topics  . Smoking status: Never Smoker   . Smokeless tobacco: Never Used  . Alcohol Use: No  . Drug Use: Not on file  . Sexually Active: Not on file   Other Topics Concern  . Not on file   Social History Narrative  . No narrative on file    Past Surgical History  Procedure Date  . Breast surgery     biopsy, right  . Uterine fibroid surgery     Family History  Problem Relation Age of Onset  . Sarcoidosis Brother     Allergies  Allergen Reactions  . Sulfonamide Derivatives     Current Outpatient Prescriptions on File Prior to Visit  Medication Sig Dispense Refill  . meloxicam (MOBIC) 7.5 MG tablet Take 15 mg by mouth daily as needed.      . polyethylene glycol powder (GLYCOLAX/MIRALAX) powder Take 17 g by mouth daily as needed.          BP 110/80  Pulse 68  Temp(Src) 98.9 F (37.2 C) (Oral)  Resp 18  Ht 5\' 7"  (1.702 m)  Wt 270 lb (122.471 kg)  BMI 42.29 kg/m2    Objective:   Physical Exam     Constitutional: Appears well-developed and well-nourished. No distress.  Eyes: Conjunctivae are normal. Pupils are equal, round, and reactive to light.  Cardiovascular: Normal rate,  regular rhythm and normal heart sounds.  Exam reveals no gallop and no friction rub.   No murmur heard. Pulmonary/Chest: Effort normal and breath sounds normal.  No wheezes. No rales.  Neurological: Alert. No cranial nerve deficit.  Skin: Skin is warm and dry. mild lower ext edema Psychiatric: Normal mood and affect. Behavior is normal.    Assessment & Plan:

## 2010-08-29 ENCOUNTER — Ambulatory Visit: Payer: 59 | Admitting: Internal Medicine

## 2010-09-22 ENCOUNTER — Ambulatory Visit (INDEPENDENT_AMBULATORY_CARE_PROVIDER_SITE_OTHER): Payer: 59 | Admitting: Internal Medicine

## 2010-09-22 ENCOUNTER — Encounter: Payer: Self-pay | Admitting: Internal Medicine

## 2010-09-22 ENCOUNTER — Ambulatory Visit: Payer: 59 | Admitting: Internal Medicine

## 2010-09-22 VITALS — BP 124/80 | HR 76 | Temp 98.2°F | Resp 18 | Ht 67.0 in | Wt 265.0 lb

## 2010-09-22 DIAGNOSIS — E039 Hypothyroidism, unspecified: Secondary | ICD-10-CM

## 2010-09-22 LAB — T4, FREE: Free T4: 0.93 ng/dL (ref 0.80–1.80)

## 2010-09-22 NOTE — Patient Instructions (Signed)
Please schedule cbc, chem7 (v58.69) and tsh/free t4 (hypothyroidism) prior to next visit

## 2010-09-22 NOTE — Assessment & Plan Note (Signed)
Stable and asx s/p dose increase. Obtain tsh and free t4.

## 2010-09-22 NOTE — Progress Notes (Signed)
  Subjective:    Patient ID: [name], female    DOB: 04/30/1965, 45 y.o.   MRN: [id number]  HPI Pt presents to clinic for followup of multiple medical problems. H/o hypothyroidism after radioactive ablation. Compliant with medication with only rare missed doses. Dose increased 4/12 with elevated tsh. Tolerating without sx's of hypo or hyperthyroidism. Wt down 5lbs since last visit. No active complaints.  Past Medical History  Diagnosis Date  . Multinodular goiter     s/p radioactive I131 ablation, Iatogenic Hypothyroidism  . IBS (irritable bowel syndrome)   . Asthma     childhood   Past Surgical History  Procedure Date  . Breast surgery     biopsy, right  . Uterine fibroid surgery     reports that she has never smoked. She has never used smokeless tobacco. She reports that she does not drink alcohol. Her drug history not on file. family history includes Sarcoidosis in her brother. Allergies  Allergen Reactions  . Sulfonamide Derivatives      Review of Systems see hpi     Objective:   Physical Exam  Physical Exam  Nursing note and vitals reviewed. Constitutional: Appears well-developed and well-nourished. No distress.  HENT:  Head: Normocephalic and atraumatic.  Right Ear: External ear normal.  Left Ear: External ear normal.  Eyes: Conjunctivae are normal. No scleral icterus.  Neck: Neck supple. Carotid bruit is not present. no thyroid tenderness, nodularity or enlargment noted. Cardiovascular: Normal rate, regular rhythm and normal heart sounds.  Exam reveals no gallop and no friction rub.   No murmur heard. Pulmonary/Chest: Effort normal and breath sounds normal. No respiratory distress. He has no wheezes. no rales.  Lymphadenopathy:    He has no cervical adenopathy.  Neurological:Alert.  Skin: Skin is warm and dry. Not diaphoretic.  Psychiatric: Has a normal mood and affect.        Assessment & Plan:

## 2010-10-03 ENCOUNTER — Other Ambulatory Visit: Payer: Self-pay | Admitting: Internal Medicine

## 2010-10-03 DIAGNOSIS — Z79899 Other long term (current) drug therapy: Secondary | ICD-10-CM

## 2010-10-03 DIAGNOSIS — E039 Hypothyroidism, unspecified: Secondary | ICD-10-CM

## 2010-12-22 ENCOUNTER — Telehealth: Payer: Self-pay | Admitting: Internal Medicine

## 2010-12-22 MED ORDER — LEVOTHYROXINE SODIUM 200 MCG PO TABS: 200.0000 ug | ORAL_TABLET | Freq: Every day | ORAL | Status: DC

## 2010-12-22 NOTE — Telephone Encounter (Signed)
rx sent in electronically 

## 2010-12-22 NOTE — Telephone Encounter (Signed)
Refill-levothyroxin tab. Take one tablet by mouth every day. Qty 30. Last fill 10.18.12

## 2011-03-12 ENCOUNTER — Other Ambulatory Visit: Payer: Self-pay | Admitting: Internal Medicine

## 2011-03-12 DIAGNOSIS — Z1231 Encounter for screening mammogram for malignant neoplasm of breast: Secondary | ICD-10-CM

## 2011-03-18 ENCOUNTER — Ambulatory Visit (HOSPITAL_BASED_OUTPATIENT_CLINIC_OR_DEPARTMENT_OTHER)
Admission: RE | Admit: 2011-03-18 | Discharge: 2011-03-18 | Disposition: A | Discharge status: Home / Self Care | Payer: 59 | Source: Ambulatory Visit | Attending: Internal Medicine | Admitting: Internal Medicine

## 2011-03-18 DIAGNOSIS — Z1231 Encounter for screening mammogram for malignant neoplasm of breast: Secondary | ICD-10-CM

## 2011-03-25 ENCOUNTER — Ambulatory Visit: Payer: 59 | Admitting: Internal Medicine

## 2011-03-25 DIAGNOSIS — Z0289 Encounter for other administrative examinations: Secondary | ICD-10-CM

## 2011-05-20 ENCOUNTER — Encounter: Payer: Self-pay | Admitting: Internal Medicine

## 2011-05-20 ENCOUNTER — Ambulatory Visit (INDEPENDENT_AMBULATORY_CARE_PROVIDER_SITE_OTHER): Payer: 59 | Admitting: Internal Medicine

## 2011-05-20 VITALS — BP 142/98 | Temp 98.7°F | Wt 265.0 lb

## 2011-05-20 DIAGNOSIS — E039 Hypothyroidism, unspecified: Secondary | ICD-10-CM

## 2011-05-20 DIAGNOSIS — R5383 Other fatigue: Secondary | ICD-10-CM

## 2011-05-20 DIAGNOSIS — R5381 Other malaise: Secondary | ICD-10-CM

## 2011-05-20 DIAGNOSIS — R531 Weakness: Secondary | ICD-10-CM

## 2011-05-20 DIAGNOSIS — H6990 Unspecified Eustachian tube disorder, unspecified ear: Secondary | ICD-10-CM

## 2011-05-20 DIAGNOSIS — H6980 Other specified disorders of Eustachian tube, unspecified ear: Secondary | ICD-10-CM

## 2011-05-20 MED ORDER — FLUTICASONE PROPIONATE 50 MCG/ACT NA SUSP: NASAL | Status: DC

## 2011-05-20 NOTE — Assessment & Plan Note (Signed)
Patient complains of weakness. She has associated heavy menstrual cycles. I suspect iron deficiency anemia. Check CBC. Patient advised to start over-the-counter iron supplement.

## 2011-05-20 NOTE — Patient Instructions (Addendum)
Use Allegra 180 mg OTC once daily Our office will contact you re: lab results

## 2011-05-20 NOTE — Progress Notes (Signed)
  Subjective:    Patient ID: [name], female    DOB: 1966/01/14, 46 y.o.   MRN: [id number]  HPI  46 year old [place] American female with history of hypothyroidism for routine follow up. Patient reports over the last several weeks she has been craving ice and has noticed weakness. She has associated heavy menstrual periods.  Patient also complains of clogged sensation in both her ears. She has mild dizziness. She denies hearing loss. No fever or chills.   Review of Systems Weakness and fatigue,  No ear pain  Past Medical History  Diagnosis Date  . Multinodular goiter     s/p radioactive I131 ablation, Iatogenic Hypothyroidism  . IBS (irritable bowel syndrome)   . Asthma     childhood    History   Social History  . Marital Status: Single    Spouse Name: N/A    Number of Children: N/A  . Years of Education: N/A   Occupational History  . Not on file.   Social History Main Topics  . Smoking status: Never Smoker   . Smokeless tobacco: Never Used  . Alcohol Use: No  . Drug Use: Not on file  . Sexually Active: Not on file   Other Topics Concern  . Not on file   Social History Narrative  . No narrative on file    Past Surgical History  Procedure Date  . Breast surgery     biopsy, right  . Uterine fibroid surgery     Family History  Problem Relation Age of Onset  . Sarcoidosis Brother     Allergies  Allergen Reactions  . Sulfonamide Derivatives     Current Outpatient Prescriptions on File Prior to Visit  Medication Sig Dispense Refill  . levothyroxine (SYNTHROID, LEVOTHROID) 200 MCG tablet Take 1 tablet (200 mcg total) by mouth daily.  30 tablet  3  . meloxicam (MOBIC) 7.5 MG tablet Take 15 mg by mouth daily as needed.      . fexofenadine (ALLEGRA) 180 MG tablet Take 1 tablet (180 mg total) by mouth daily.      . fluticasone (FLONASE) 50 MCG/ACT nasal spray 2 sprays into each nostril once daily  16 g  6    BP 142/98  Temp(Src) 98.7 F (37.1 C)  (Oral)  Wt 265 lb (120.203 kg)       Objective:   Physical Exam  Constitutional: She is oriented to person, place, and time. She appears well-developed and well-nourished.  HENT:  Head: Normocephalic and atraumatic.       Bilateral tympanic membranes are retracted but otherwise normal in appearance  Eyes: Pupils are equal, round, and reactive to light.       Slightly pale conjunctiva  Cardiovascular: Normal rate, regular rhythm and normal heart sounds.   Pulmonary/Chest: Effort normal and breath sounds normal. She has no wheezes. She has no rales.  Musculoskeletal: She exhibits no edema.  Neurological: She is alert and oriented to person, place, and time.  Skin: Skin is warm and dry.  Psychiatric: She has a normal mood and affect. Her behavior is normal.          Assessment & Plan:

## 2011-05-20 NOTE — Assessment & Plan Note (Signed)
Monitor thyroid function. Adjust levothyroxine dose accordingly.

## 2011-05-20 NOTE — Assessment & Plan Note (Signed)
Treat with intranasal steroids and Allegra.  Patient advised to call office if symptoms persist or worsen.

## 2011-05-21 LAB — CBC WITH DIFFERENTIAL/PLATELET
Basophils Absolute: 0 10*3/uL (ref 0.0–0.1)
Basophils Relative: 0.8 % (ref 0.0–3.0)
Hemoglobin: 11.1 g/dL — ABNORMAL LOW (ref 12.0–15.0)
MCHC: 32.8 g/dL (ref 30.0–36.0)
Monocytes Absolute: 0.3 10*3/uL (ref 0.1–1.0)
Monocytes Relative: 7.5 % (ref 3.0–12.0)
Platelets: 264 10*3/uL (ref 150.0–400.0)
RDW: 14.7 % — ABNORMAL HIGH (ref 11.5–14.6)

## 2011-05-21 LAB — BASIC METABOLIC PANEL
BUN: 7 mg/dL (ref 6–23)
Calcium: 8.9 mg/dL (ref 8.4–10.5)
Creatinine, Ser: 0.8 mg/dL (ref 0.4–1.2)
GFR: 101.44 mL/min (ref >60.00)
Glucose, Bld: 82 mg/dL (ref 70–99)
Sodium: 136 mEq/L (ref 135–145)

## 2011-05-26 ENCOUNTER — Other Ambulatory Visit: Payer: Self-pay | Admitting: *Deleted

## 2011-05-26 MED ORDER — FERROUS SULFATE 325 (65 FE) MG PO TABS: 325.0000 mg | ORAL_TABLET | Freq: Every day | ORAL | Status: DC

## 2011-05-26 MED ORDER — LEVOTHYROXINE SODIUM 200 MCG PO TABS: 200.0000 ug | ORAL_TABLET | Freq: Every day | ORAL | Status: DC

## 2011-06-04 ENCOUNTER — Telehealth: Payer: Self-pay | Admitting: Internal Medicine

## 2011-06-04 NOTE — Telephone Encounter (Signed)
Pt still having clogged ears. [facility] [address]

## 2011-06-04 NOTE — Telephone Encounter (Signed)
Ear is still blocked no matter what pt does and meds she takes.  Will see Dr. Artist Pais next week to look at it again.

## 2011-06-08 ENCOUNTER — Ambulatory Visit (INDEPENDENT_AMBULATORY_CARE_PROVIDER_SITE_OTHER): Payer: 59 | Admitting: Internal Medicine

## 2011-06-08 ENCOUNTER — Encounter: Payer: Self-pay | Admitting: Internal Medicine

## 2011-06-08 VITALS — BP 124/90 | Temp 98.8°F | Wt 266.0 lb

## 2011-06-08 DIAGNOSIS — H6980 Other specified disorders of Eustachian tube, unspecified ear: Secondary | ICD-10-CM

## 2011-06-08 MED ORDER — CEFUROXIME AXETIL 500 MG PO TABS: 500.0000 mg | ORAL_TABLET | Freq: Two times a day (BID) | ORAL | Status: AC

## 2011-06-08 NOTE — Progress Notes (Signed)
  Subjective:    Patient ID: [name], female    DOB: [birth year], 46 y.o.   MRN: [id number]  HPI  46 year old [place] American female previously seen for hypothyroidism and possible eustachian tube dysfunction for followup. Despite taking intranasal steroids and antihistamine patient having persistent clogged sensation in her left ear. She denies fever or chills. She denies ear pain.  No hearing loss.  Review of Systems Negative for fever or chills    Past Medical History  Diagnosis Date  . Multinodular goiter     s/p radioactive I131 ablation, Iatogenic Hypothyroidism  . IBS (irritable bowel syndrome)   . Asthma     childhood    History   Social History  . Marital Status: Single    Spouse Name: N/A    Number of Children: N/A  . Years of Education: N/A   Occupational History  . Not on file.   Social History Main Topics  . Smoking status: Never Smoker   . Smokeless tobacco: Never Used  . Alcohol Use: No  . Drug Use: Not on file  . Sexually Active: Not on file   Other Topics Concern  . Not on file   Social History Narrative  . No narrative on file    Past Surgical History  Procedure Date  . Breast surgery     biopsy, right  . Uterine fibroid surgery     Family History  Problem Relation Age of Onset  . Sarcoidosis Brother     Allergies  Allergen Reactions  . Sulfonamide Derivatives     Current Outpatient Prescriptions on File Prior to Visit  Medication Sig Dispense Refill  . ferrous sulfate 325 (65 FE) MG tablet Take 1 tablet (325 mg total) by mouth daily with breakfast.  30 tablet  3  . levothyroxine (SYNTHROID, LEVOTHROID) 200 MCG tablet Take 1 tablet (200 mcg total) by mouth daily.  30 tablet  3  . meloxicam (MOBIC) 7.5 MG tablet Take 15 mg by mouth daily as needed.        BP 124/90  Temp(Src) 98.8 F (37.1 C) (Oral)  Wt 266 lb (120.657 kg)    Objective:   Physical Exam  Constitutional: She appears well-developed and well-nourished.   HENT:  Head: Normocephalic and atraumatic.  Right Ear: External ear normal.       Left TM retracted,  no erythema, question air-fluid level  Cardiovascular: Normal rate, regular rhythm and normal heart sounds.   Pulmonary/Chest: Effort normal and breath sounds normal. She has no wheezes.       Assessment & Plan:

## 2011-06-08 NOTE — Assessment & Plan Note (Signed)
Patient having persistent left ear symptoms. She has tried using Flonase as directed. Question serous otitis media. Trial of cefuroxime 500 mg twice a day x10 days. Use decongestant for one week. If no improvement, we discussed referral to ENT.

## 2011-06-08 NOTE — Patient Instructions (Signed)
Please call our office if your symptoms do not improve or gets worse. Use allegra D 12 hr over the counter once daily for 1 week

## 2011-12-07 ENCOUNTER — Other Ambulatory Visit: Payer: Self-pay | Admitting: Internal Medicine

## 2011-12-08 ENCOUNTER — Telehealth: Payer: Self-pay | Admitting: Internal Medicine

## 2011-12-08 NOTE — Telephone Encounter (Signed)
Samples upfront, pt aware 

## 2011-12-08 NOTE — Telephone Encounter (Signed)
Pt would like samples of Synthroid to get her through til she pick up her RX from pharmacy. Pt is at work, [phone]

## 2012-01-19 ENCOUNTER — Telehealth: Payer: Self-pay | Admitting: Internal Medicine

## 2012-01-19 NOTE — Telephone Encounter (Signed)
She can come in at 1045

## 2012-01-19 NOTE — Telephone Encounter (Signed)
Done. Thanks.

## 2012-01-19 NOTE — Telephone Encounter (Signed)
Pt would like to come in sooner tomorrow (in the am) for that flu shot. Could you work in sooner?

## 2012-01-20 ENCOUNTER — Ambulatory Visit (INDEPENDENT_AMBULATORY_CARE_PROVIDER_SITE_OTHER): Payer: 59 | Admitting: Internal Medicine

## 2012-01-20 DIAGNOSIS — Z23 Encounter for immunization: Secondary | ICD-10-CM

## 2012-02-01 ENCOUNTER — Encounter (HOSPITAL_COMMUNITY): Payer: Self-pay | Admitting: *Deleted

## 2012-02-01 ENCOUNTER — Emergency Department (HOSPITAL_COMMUNITY)
Admission: EM | Admit: 2012-02-01 | Discharge: 2012-02-02 | Disposition: A | Discharge status: Home / Self Care | Payer: 59 | Attending: Emergency Medicine | Admitting: Emergency Medicine

## 2012-02-01 DIAGNOSIS — E032 Hypothyroidism due to medicaments and other exogenous substances: Secondary | ICD-10-CM | POA: Insufficient documentation

## 2012-02-01 DIAGNOSIS — K5900 Constipation, unspecified: Secondary | ICD-10-CM | POA: Insufficient documentation

## 2012-02-01 DIAGNOSIS — E042 Nontoxic multinodular goiter: Secondary | ICD-10-CM | POA: Insufficient documentation

## 2012-02-01 DIAGNOSIS — Z8709 Personal history of other diseases of the respiratory system: Secondary | ICD-10-CM | POA: Insufficient documentation

## 2012-02-01 DIAGNOSIS — Z8719 Personal history of other diseases of the digestive system: Secondary | ICD-10-CM | POA: Insufficient documentation

## 2012-02-01 DIAGNOSIS — R6889 Other general symptoms and signs: Secondary | ICD-10-CM | POA: Insufficient documentation

## 2012-02-01 DIAGNOSIS — R209 Unspecified disturbances of skin sensation: Secondary | ICD-10-CM

## 2012-02-01 NOTE — ED Notes (Signed)
Pt states after being constipated and having large frequent bowel movements this weekend she has had a heaviness feeling in her rectum that is gradually getting better; Sun her feet started feeling heavy like she was walking around with bricks

## 2012-02-02 MED ORDER — POLYETHYLENE GLYCOL 3350 17 GM/SCOOP PO POWD: 17.0000 g | Freq: Two times a day (BID) | ORAL | Status: DC

## 2012-02-02 NOTE — ED Provider Notes (Signed)
History     CSN: [id number]  Arrival date & time 02/01/12  2246   First MD Initiated Contact with Patient 02/01/12 2350      No chief complaint on file.   (Consider location/radiation/quality/duration/timing/severity/associated sxs/prior treatment) HPI Comments: This is a 46 year old female, who presents emergency department with subjective coldness to bilateral feet. Patient states that she first noticed this today, and states that she feels like her feet are heavy. She denies weakness, numbness or tingling of the feet. Additionally she states that she has been constipated and feels like she has a full bowel. She denies rectal bleeding, any blood in stools, denies nausea, vomiting, diarrhea. She denies chest pain, shortness of breath. Denies any recent travel or recent surgery. She denies pain in the calves. She states that her symptoms are mild, but that she wanted to have them evaluated. She recently changed her Synthroid.  The history is provided by the patient. No language interpreter was used.    Past Medical History  Diagnosis Date  . Multinodular goiter     s/p radioactive I131 ablation, Iatogenic Hypothyroidism  . IBS (irritable bowel syndrome)   . Asthma     childhood    Past Surgical History  Procedure Date  . Breast surgery     biopsy, right  . Uterine fibroid surgery     Family History  Problem Relation Age of Onset  . Sarcoidosis Brother     History  Substance Use Topics  . Smoking status: Never Smoker   . Smokeless tobacco: Never Used  . Alcohol Use: No    OB History    Grav Para Term Preterm Abortions TAB SAB Ect Mult Living                  Review of Systems  All other systems reviewed and are negative.    Allergies  Sulfonamide derivatives  Home Medications   Current Outpatient Rx  Name  Route  Sig  Dispense  Refill  . FERROUS SULFATE 325 (65 FE) MG PO TABS   Oral   Take 1 tablet (325 mg total) by mouth daily with breakfast.   30  tablet   3   . LEVOTHYROXINE SODIUM 200 MCG PO TABS      TAKE ONE TABLET BY MOUTH EVERY DAY   30 tablet   0   . MELOXICAM 7.5 MG PO TABS   Oral   Take 15 mg by mouth daily as needed.           Prescribed by Dr Ranee Gosselin (ortho)     BP 162/87  Pulse 91  Temp 98.1 F (36.7 C) (Oral)  Resp 20  Ht 5\' 7"  (1.702 m)  Wt 260 lb (117.935 kg)  BMI 40.72 kg/m2  SpO2 99%  LMP 01/22/2012  Physical Exam  Nursing note and vitals reviewed. Constitutional: She is oriented to person, place, and time. She appears well-developed and well-nourished.  HENT:  Head: Normocephalic and atraumatic.  Eyes: Conjunctivae normal and EOM are normal. Pupils are equal, round, and reactive to light.  Neck: Normal range of motion. Neck supple.  Cardiovascular: Normal rate and regular rhythm.  Exam reveals no gallop and no friction rub.   No murmur heard.      Distal pulses intact, with strong capillary refill.  Pulmonary/Chest: Effort normal and breath sounds normal. No respiratory distress. She has no wheezes. She has no rales. She exhibits no tenderness.  Abdominal: Soft. Bowel sounds are normal. She  exhibits no distension and no mass. There is no tenderness. There is no rebound and no guarding.  Musculoskeletal: Normal range of motion. She exhibits no edema and no tenderness.       Full range of motion and strength in bilateral lower extremities.  Neurological: She is alert and oriented to person, place, and time.       Sensation and strength are intact in bilateral lower extremities.  Skin: Skin is warm and dry.       Feet feel slightly cool to the touch but no more than expected for being barefoot in a cold room.  Psychiatric: She has a normal mood and affect. Her behavior is normal. Judgment and thought content normal.    ED Course  Procedures (including critical care time)  Labs Reviewed - No data to display No results found.   1. Cold feet   2. Constipation       MDM    46 year old female with constipation and subjective bilateral cold feet. Patient is neurovascularly intact, do not believe any acute process is at play in the patient's cold feet. She has no recent travel, pain in her legs, shortness of breath, non-tachycardic. Doubt DVT. Will discharge the patient with instructions to followup with her primary care provider for thyroid evaluation. Will also discharge with MiraLax for constipation. Give the patient specific return precautions. Patient understands and agrees with the plan. She is stable and ready for discharge.        Roxy Horseman, PA-C 02/02/12 [phone]

## 2012-02-02 NOTE — ED Provider Notes (Signed)
Medical screening examination/treatment/procedure(s) were performed by non-physician practitioner and as supervising physician I was immediately available for consultation/collaboration.  Gerhard Munch, MD 02/02/12 [phone]

## 2012-02-03 ENCOUNTER — Emergency Department (HOSPITAL_COMMUNITY)
Admission: EM | Admit: 2012-02-03 | Discharge: 2012-02-03 | Disposition: A | Discharge status: Home / Self Care | Payer: 59 | Attending: Emergency Medicine | Admitting: Emergency Medicine

## 2012-02-03 ENCOUNTER — Encounter (HOSPITAL_COMMUNITY): Payer: Self-pay | Admitting: Emergency Medicine

## 2012-02-03 DIAGNOSIS — Z862 Personal history of diseases of the blood and blood-forming organs and certain disorders involving the immune mechanism: Secondary | ICD-10-CM | POA: Insufficient documentation

## 2012-02-03 DIAGNOSIS — R209 Unspecified disturbances of skin sensation: Secondary | ICD-10-CM | POA: Insufficient documentation

## 2012-02-03 DIAGNOSIS — R202 Paresthesia of skin: Secondary | ICD-10-CM

## 2012-02-03 DIAGNOSIS — J45909 Unspecified asthma, uncomplicated: Secondary | ICD-10-CM | POA: Insufficient documentation

## 2012-02-03 DIAGNOSIS — Z8719 Personal history of other diseases of the digestive system: Secondary | ICD-10-CM | POA: Insufficient documentation

## 2012-02-03 DIAGNOSIS — Z79899 Other long term (current) drug therapy: Secondary | ICD-10-CM | POA: Insufficient documentation

## 2012-02-03 DIAGNOSIS — Z8639 Personal history of other endocrine, nutritional and metabolic disease: Secondary | ICD-10-CM | POA: Insufficient documentation

## 2012-02-03 LAB — BASIC METABOLIC PANEL
BUN: 10 mg/dL (ref 6–23)
CO2: 29 mEq/L (ref 19–32)
Chloride: 103 mEq/L (ref 96–112)
Creatinine, Ser: 0.76 mg/dL (ref 0.50–1.10)

## 2012-02-03 LAB — CK: Total CK: 125 U/L (ref 7–177)

## 2012-02-03 MED ORDER — OXYCODONE-ACETAMINOPHEN 5-325 MG PO TABS: 1.0000 | ORAL_TABLET | ORAL | Status: DC | PRN

## 2012-02-03 NOTE — ED Notes (Addendum)
Pt reports numbness and tingling in tingling in feet since Sunday. States "my feet feel heavy when I walk. Like I have shoes on even when I don't". Denies any other areas of numbness. Pt spends several hours a day on her feet at work. Denies N/V. Denies SOB. Denies vision changes. Denies any other pain. Denies any previous episode.

## 2012-02-03 NOTE — ED Notes (Signed)
Pt for discharge.Vital signs stable and GCS 15 

## 2012-02-03 NOTE — ED Provider Notes (Signed)
History     CSN: [id number]  Arrival date & time 02/03/12  0111   First MD Initiated Contact with Patient 02/03/12 [phone]      Chief Complaint  Patient presents with  . Numbness     Patient is a 47 y.o. female presenting with neurologic complaint. The history is provided by the patient.  Neurologic Problem The primary symptoms include paresthesias. Primary symptoms do not include headaches, syncope, loss of consciousness, seizures, dizziness, focal weakness, fever, nausea or vomiting. The symptoms began 3 to 5 days ago. The symptoms are unchanged. The neurological symptoms are diffuse.  Additional symptoms do not include weakness.  pt reports that both of her feet feel "heavy" and numb.  No weakness is reported.  No back pain.  No neck pain.  No focal weakness.  No injuries to her feet.  No cp/sob.  No abd pain.  No fever.  She can ambulate.  She reports that it feels like her feet "are being squeezed" She reports she has had this before and has been seen by podiatry before and received "injections"  Past Medical History  Diagnosis Date  . Multinodular goiter     s/p radioactive I131 ablation, Iatogenic Hypothyroidism  . IBS (irritable bowel syndrome)   . Asthma     childhood    Past Surgical History  Procedure Date  . Breast surgery     biopsy, right  . Uterine fibroid surgery     Family History  Problem Relation Age of Onset  . Sarcoidosis Brother     History  Substance Use Topics  . Smoking status: Never Smoker   . Smokeless tobacco: Never Used  . Alcohol Use: No    OB History    Grav Para Term Preterm Abortions TAB SAB Ect Mult Living                  Review of Systems  Constitutional: Negative for fever and fatigue.  Respiratory: Negative for shortness of breath.   Cardiovascular: Negative for chest pain and syncope.  Gastrointestinal: Negative for nausea and vomiting.  Musculoskeletal: Negative for back pain.  Skin: Negative for color change.    Neurological: Positive for numbness and paresthesias. Negative for dizziness, focal weakness, seizures, loss of consciousness, weakness and headaches.  All other systems reviewed and are negative.    Allergies  Sulfonamide derivatives  Home Medications   Current Outpatient Rx  Name  Route  Sig  Dispense  Refill  . FERROUS SULFATE 325 (65 FE) MG PO TABS   Oral   Take 1 tablet (325 mg total) by mouth daily with breakfast.   30 tablet   3   . LEVOTHYROXINE SODIUM 200 MCG PO TABS      TAKE ONE TABLET BY MOUTH EVERY DAY   30 tablet   0   . MELOXICAM 7.5 MG PO TABS   Oral   Take 15 mg by mouth daily as needed.           Prescribed by Dr Ranee Gosselin (ortho)   . POLYETHYLENE GLYCOL 3350 PO POWD   Oral   Take 17 g by mouth 2 (two) times daily. Until daily soft stools  OTC   255 g   0   . OXYCODONE-ACETAMINOPHEN 5-325 MG PO TABS   Oral   Take 1 tablet by mouth every 4 (four) hours as needed for pain.   5 tablet   0     BP 131/78  Pulse 78  Temp 98.7 F (37.1 C) (Oral)  Resp 18  SpO2 98%  LMP 01/22/2012  Physical Exam CONSTITUTIONAL: Well developed/well nourished HEAD AND FACE: Normocephalic/atraumatic EYES: EOMI/PERRL ENMT: Mucous membranes moist NECK: supple no meningeal signs SPINE:entire spine nontender CV: S1/S2 noted, no murmurs/rubs/gallops noted LUNGS: Lungs are clear to auscultation bilaterally, no apparent distress ABDOMEN: soft, nontender, no rebound or guarding GU:no cva tenderness NEURO: Pt is awake/alert, moves all extremitiesx4. equal distal motor: hip flexion/knee flexion/extension, ankle dorsi/plantar flexion, great toe extension intact bilaterally, no clonus bilaterally.  Equal patellar/achilles reflex noted.  Pt is able to ambulate. No ataxia.  She reports numbness to both of her feet on the dorsal surface of each foot She does not appear to have lost proprioception in either foot EXTREMITIES: pulses normal, full ROM. No bruising, no  edema noted.  No discoloration or cyanosis noted SKIN: warm, color normal PSYCH: no abnormalities of mood noted  ED Course  Procedures   Labs Reviewed  BASIC METABOLIC PANEL  CK     1. Paresthesias    Pt well appearing.  I don't see any focal motor deficits.  Labs reassuring.  She does report recent flu shot, but has no significant findings at this time to suggest guillain barre syndrome.  I discussed at length signs/symptoms of when to return including worsened numbness/pain or difficulty walking or any new weakness in her legs.  She already has followup arranged   MDM  Nursing notes including past medical history and social history reviewed and considered in documentation Labs/vital reviewed and considered Previous records reviewed and considered - previous ED visit reviewed         Joya Gaskins, MD 02/03/12 [phone]

## 2012-02-03 NOTE — ED Notes (Signed)
PT. REPORTS FEET NUMBNESS ONSET LAST Sunday SEEN AT [place] THE SAME DAY DISCHARGED HOME , PT. STATES " FEELS HEAVY/TIGHT" , DENIES INURY /AMBULATORY.

## 2012-02-21 ENCOUNTER — Other Ambulatory Visit: Payer: Self-pay | Admitting: Internal Medicine

## 2012-02-23 ENCOUNTER — Ambulatory Visit (INDEPENDENT_AMBULATORY_CARE_PROVIDER_SITE_OTHER): Payer: 59 | Admitting: Internal Medicine

## 2012-02-23 ENCOUNTER — Encounter: Payer: Self-pay | Admitting: Internal Medicine

## 2012-02-23 VITALS — BP 124/80 | Temp 97.8°F | Wt 270.0 lb

## 2012-02-23 DIAGNOSIS — G629 Polyneuropathy, unspecified: Secondary | ICD-10-CM | POA: Insufficient documentation

## 2012-02-23 DIAGNOSIS — G589 Mononeuropathy, unspecified: Secondary | ICD-10-CM

## 2012-02-23 DIAGNOSIS — E039 Hypothyroidism, unspecified: Secondary | ICD-10-CM

## 2012-02-23 DIAGNOSIS — R2681 Unsteadiness on feet: Secondary | ICD-10-CM

## 2012-02-23 DIAGNOSIS — G609 Hereditary and idiopathic neuropathy, unspecified: Secondary | ICD-10-CM

## 2012-02-23 DIAGNOSIS — D649 Anemia, unspecified: Secondary | ICD-10-CM | POA: Insufficient documentation

## 2012-02-23 DIAGNOSIS — R269 Unspecified abnormalities of gait and mobility: Secondary | ICD-10-CM

## 2012-02-23 NOTE — Assessment & Plan Note (Signed)
Patient complains of abnormal sensation in both feet. She has decreased sensation to temperature and vibration. It is somewhat greater on the right than left. I suspect symptoms secondary to polyneuropathy. Check A1c, TSH, RPR and B12. Refer to neurology for further testing. Defer nerve conduction testing to neurology.  I suspect her unsteady gait may be related to her peripheral neuropathy. She is nonfocal on exam.  Defer further work up to neurology.

## 2012-02-23 NOTE — Assessment & Plan Note (Signed)
Monitor TFTs

## 2012-02-23 NOTE — Assessment & Plan Note (Addendum)
She has history of anemia. Monitor CBC

## 2012-02-23 NOTE — Progress Notes (Signed)
Subjective:    Patient ID: [name], female    DOB: October 16, 1965, 47 y.o.   MRN: [id number]  HPI  47 year old African American female with history of hypothyroidism and anemia complains of feeling off balance for last several weeks. Patient was seen in ER in the last several weeks secondary to abnormal sensation in both of her feet. Patient reports her feet feel tight and abnormal. She feels like she is walking on heels. Patient was seen by podiatrists. X-ray of the feet were performed. Patient reports it showed arthritis and she was treated with cortisone injections bilaterally. Patient reports no change in symptoms.  Patient also feels unbalanced. She is staggering at work. She denies any vertigo symptoms. She denies any changes in vision or double vision.  Her father passed away in 04-Nov-2011. He died of complications of end-stage renal disease.  Review of Systems Negative for orthostatic symptoms, negative for nausea  Past Medical History  Diagnosis Date  . Multinodular goiter     s/p radioactive I131 ablation, Iatogenic Hypothyroidism  . IBS (irritable bowel syndrome)   . Asthma     childhood    History   Social History  . Marital Status: Single    Spouse Name: N/A    Number of Children: N/A  . Years of Education: N/A   Occupational History  . Not on file.   Social History Main Topics  . Smoking status: Never Smoker   . Smokeless tobacco: Never Used  . Alcohol Use: No  . Drug Use: Not on file  . Sexually Active: Not on file   Other Topics Concern  . Not on file   Social History Narrative  . No narrative on file    Past Surgical History  Procedure Date  . Breast surgery     biopsy, right  . Uterine fibroid surgery     Family History  Problem Relation Age of Onset  . Sarcoidosis Brother   . Kidney failure Father     Allergies  Allergen Reactions  . Sulfonamide Derivatives Hives    Current Outpatient Prescriptions on File Prior to  Visit  Medication Sig Dispense Refill  . ferrous sulfate 325 (65 FE) MG tablet Take 1 tablet (325 mg total) by mouth daily with breakfast.  30 tablet  3  . levothyroxine (SYNTHROID, LEVOTHROID) 200 MCG tablet TAKE ONE TABLET BY MOUTH EVERY DAY  30 tablet  2  . meloxicam (MOBIC) 7.5 MG tablet Take 15 mg by mouth daily as needed.      Marland Kitchen oxyCODONE-acetaminophen (PERCOCET/ROXICET) 5-325 MG per tablet Take 1 tablet by mouth every 4 (four) hours as needed for pain.  5 tablet  0  . polyethylene glycol powder (GLYCOLAX/MIRALAX) powder Take 17 g by mouth 2 (two) times daily. Until daily soft stools  OTC  255 g  0    BP 124/80  Temp 97.8 F (36.6 C) (Oral)  Wt 270 lb (122.471 kg)  LMP 01/22/2012       Objective:   Physical Exam  Constitutional: She is oriented to person, place, and time. She appears well-developed and well-nourished.  HENT:  Head: Normocephalic and atraumatic.  Right Ear: External ear normal.  Left Ear: External ear normal.  Mouth/Throat: Oropharynx is clear and moist.  Eyes: Conjunctivae normal and EOM are normal. Pupils are equal, round, and reactive to light.       No defect in peripheral vision  Neck: Neck supple.  Cardiovascular: Normal rate, regular rhythm and normal  heart sounds.   No murmur heard. Pulmonary/Chest: Effort normal and breath sounds normal. She has no wheezes.  Abdominal: Soft. Bowel sounds are normal. There is no tenderness.  Lymphadenopathy:    She has no cervical adenopathy.  Neurological: She is alert and oriented to person, place, and time. She has normal strength. She displays no tremor. No cranial nerve deficit. She exhibits normal muscle tone. She displays a negative Romberg sign.  Reflex Scores:      Bicep reflexes are 1+ on the right side and 1+ on the left side.      Patellar reflexes are 2+ on the right side and 2+ on the left side.      Achilles reflexes are 2+ on the right side and 2+ on the left side.      Decreased sensation to  temperature and vibration bilaterally (slightly more on the right foot versus left) Negative cerebellar signs Slight unsteady gait  Skin: Skin is warm and dry.  Psychiatric: She has a normal mood and affect. Her behavior is normal.          Assessment & Plan:

## 2012-02-24 LAB — VITAMIN B12: Vitamin B-12: 272 pg/mL (ref 211–911)

## 2012-02-24 LAB — BASIC METABOLIC PANEL
BUN: 11 mg/dL (ref 6–23)
CO2: 19 mEq/L (ref 19–32)
Calcium: 8.9 mg/dL (ref 8.4–10.5)
Creatinine, Ser: 0.9 mg/dL (ref 0.4–1.2)
Glucose, Bld: 80 mg/dL (ref 70–99)

## 2012-02-24 LAB — CBC WITH DIFFERENTIAL/PLATELET
Basophils Absolute: 0.1 10*3/uL (ref 0.0–0.1)
Eosinophils Absolute: 0.1 10*3/uL (ref 0.0–0.7)
Hemoglobin: 11.1 g/dL — ABNORMAL LOW (ref 12.0–15.0)
Lymphocytes Relative: 23.2 % (ref 12.0–46.0)
MCHC: 32.5 g/dL (ref 30.0–36.0)
Neutro Abs: 4.2 10*3/uL (ref 1.4–7.7)
Neutrophils Relative %: 68.1 % (ref 43.0–77.0)
Platelets: 242 10*3/uL (ref 150.0–400.0)
RDW: 15.3 % — ABNORMAL HIGH (ref 11.5–14.6)

## 2012-02-24 LAB — HEMOGLOBIN A1C: Hgb A1c MFr Bld: 5.8 % (ref 4.6–6.5)

## 2012-03-01 ENCOUNTER — Other Ambulatory Visit: Payer: Self-pay | Admitting: *Deleted

## 2012-03-01 MED ORDER — LEVOTHYROXINE SODIUM 112 MCG PO TABS: 224.0000 ug | ORAL_TABLET | Freq: Every day | ORAL | Status: DC

## 2012-03-09 ENCOUNTER — Other Ambulatory Visit: Payer: Self-pay | Admitting: Internal Medicine

## 2012-03-09 DIAGNOSIS — Z1231 Encounter for screening mammogram for malignant neoplasm of breast: Secondary | ICD-10-CM

## 2012-03-23 DIAGNOSIS — R209 Unspecified disturbances of skin sensation: Secondary | ICD-10-CM | POA: Insufficient documentation

## 2012-03-30 ENCOUNTER — Ambulatory Visit (HOSPITAL_BASED_OUTPATIENT_CLINIC_OR_DEPARTMENT_OTHER): Payer: 59

## 2012-05-10 ENCOUNTER — Inpatient Hospital Stay (HOSPITAL_BASED_OUTPATIENT_CLINIC_OR_DEPARTMENT_OTHER): Admission: RE | Admit: 2012-05-10 | Payer: 59 | Source: Ambulatory Visit

## 2012-05-11 ENCOUNTER — Ambulatory Visit (HOSPITAL_BASED_OUTPATIENT_CLINIC_OR_DEPARTMENT_OTHER)
Admission: RE | Admit: 2012-05-11 | Discharge: 2012-05-11 | Disposition: A | Discharge status: Home / Self Care | Payer: 59 | Source: Ambulatory Visit | Attending: Internal Medicine | Admitting: Internal Medicine

## 2012-05-11 DIAGNOSIS — Z1231 Encounter for screening mammogram for malignant neoplasm of breast: Secondary | ICD-10-CM

## 2012-07-13 ENCOUNTER — Other Ambulatory Visit: Payer: Self-pay | Admitting: *Deleted

## 2012-07-13 MED ORDER — MELOXICAM 7.5 MG PO TABS: 15.0000 mg | ORAL_TABLET | Freq: Every day | ORAL | Status: DC | PRN

## 2012-08-03 ENCOUNTER — Encounter: Payer: Self-pay | Admitting: Neurology

## 2012-08-03 ENCOUNTER — Ambulatory Visit: Payer: Self-pay | Admitting: Neurology

## 2012-08-03 DIAGNOSIS — R209 Unspecified disturbances of skin sensation: Secondary | ICD-10-CM

## 2013-04-05 ENCOUNTER — Telehealth: Payer: Self-pay | Admitting: Internal Medicine

## 2013-04-05 NOTE — Telephone Encounter (Signed)
Pt request refill of levothyroxine (SYNTHROID, LEVOTHROID) 112 MCG tablet [facility]/ [place] Pt aware she needs appt. Will get her schedule and can make April appt.

## 2013-04-06 MED ORDER — LEVOTHYROXINE SODIUM 112 MCG PO TABS: 224.0000 ug | ORAL_TABLET | Freq: Every day | ORAL | Status: DC

## 2013-04-06 NOTE — Telephone Encounter (Signed)
rx sent in for 1 month supply, no more refills till she has an appt

## 2013-04-08 ENCOUNTER — Other Ambulatory Visit: Payer: Self-pay | Admitting: Internal Medicine

## 2013-04-14 ENCOUNTER — Other Ambulatory Visit: Payer: Self-pay | Admitting: Internal Medicine

## 2013-04-14 MED ORDER — LEVOTHYROXINE SODIUM 112 MCG PO TABS: 224.0000 ug | ORAL_TABLET | Freq: Every day | ORAL | Status: DC

## 2013-04-14 NOTE — Telephone Encounter (Signed)
Pt said pharm did not receive rx for levothyroxine

## 2013-04-14 NOTE — Telephone Encounter (Signed)
rx resent to pharmacy

## 2013-05-08 ENCOUNTER — Telehealth: Payer: Self-pay | Admitting: Internal Medicine

## 2013-05-08 MED ORDER — LEVOTHYROXINE SODIUM 112 MCG PO TABS: ORAL_TABLET | ORAL | Status: DC

## 2013-05-08 NOTE — Telephone Encounter (Signed)
Pt has 2 pills left. Pt has appt sch for 06-02-13. Pt needs refill on levothyroxine 112 mcg #60

## 2013-05-08 NOTE — Telephone Encounter (Signed)
Rx sent to pharmacy   

## 2013-06-02 ENCOUNTER — Ambulatory Visit (INDEPENDENT_AMBULATORY_CARE_PROVIDER_SITE_OTHER): Payer: 59 | Admitting: Internal Medicine

## 2013-06-02 ENCOUNTER — Encounter: Payer: Self-pay | Admitting: Internal Medicine

## 2013-06-02 VITALS — BP 140/84 | HR 84 | Temp 99.2°F | Ht 67.25 in | Wt 272.0 lb

## 2013-06-02 DIAGNOSIS — D649 Anemia, unspecified: Secondary | ICD-10-CM

## 2013-06-02 DIAGNOSIS — R0609 Other forms of dyspnea: Secondary | ICD-10-CM

## 2013-06-02 DIAGNOSIS — R400 Somnolence: Secondary | ICD-10-CM | POA: Insufficient documentation

## 2013-06-02 DIAGNOSIS — G629 Polyneuropathy, unspecified: Secondary | ICD-10-CM

## 2013-06-02 DIAGNOSIS — G609 Hereditary and idiopathic neuropathy, unspecified: Secondary | ICD-10-CM

## 2013-06-02 DIAGNOSIS — R0989 Other specified symptoms and signs involving the circulatory and respiratory systems: Secondary | ICD-10-CM

## 2013-06-02 DIAGNOSIS — R404 Transient alteration of awareness: Secondary | ICD-10-CM

## 2013-06-02 DIAGNOSIS — R0683 Snoring: Secondary | ICD-10-CM

## 2013-06-02 DIAGNOSIS — E039 Hypothyroidism, unspecified: Secondary | ICD-10-CM

## 2013-06-02 LAB — CBC AND DIFFERENTIAL
HEMATOCRIT: 33 % — AB (ref 36–46)
HEMOGLOBIN: 10.6 g/dL — AB (ref 12.0–16.0)
Neutrophils Absolute: 3 /uL
WBC: 4.4 10^3/mL

## 2013-06-02 LAB — BASIC METABOLIC PANEL
BUN: 11 mg/dL (ref 4–21)
Creatinine: 0.8 mg/dL (ref <=1.1)
Glucose: 93 mg/dL
Potassium: 3.6 mmol/L (ref 3.4–5.3)
SODIUM: 140 mmol/L (ref 137–147)

## 2013-06-02 MED ORDER — MELOXICAM 15 MG PO TABS: 15.0000 mg | ORAL_TABLET | Freq: Every day | ORAL | Status: DC | PRN

## 2013-06-02 NOTE — Progress Notes (Signed)
Subjective:    Patient ID: [name], female    DOB: Jan 28, 1966, 48 y.o.   MRN: [id number]  HPI  48 year old [place] with history of iatrogenic hypothyroidism, morbid obesity and anemia for routine followup. It has been over a year since her previous visit. Patient previously seen for bilateral leg pain (question neuropathy). Patient reports she was seen by neurologist-Dr. Anne HahnWillis. Her workup was unremarkable which included EMG/nerve conduction study. He recommended MRI of lumbar spine but she never completed. Patient reports her symptoms resolved on its own.  Hypothyroidism-patient currently taking levothyroxine 112 mcg 2 tablets once daily. Patient reports good medication compliance. Her weight is fairly stable. She is following a fairly healthy diet.  Patient complains of daytime somnolence. She gets at least 8-9 hours of sleep per night but still feels sleepy during the day.   Her father passed away last year.  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   Review of Systems Fatigue, no significant weight change, negative for chest pain     Past Medical History  Diagnosis Date  . Multinodular goiter     s/p radioactive I131 ablation, Iatogenic Hypothyroidism  . IBS (irritable bowel syndrome)   . Asthma     childhood  . Obesity   . Hypothyroidism   . Personal history of goiter     with radioactive iodine ablation  . 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 Marital Status: Single    Spouse Name: N/A    Number of Children: 0  . Years of Education: 16   Occupational History  . 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 Smoking status: Never Smoker   . Smokeless tobacco: Never Used  . Alcohol Use: No  . Drug Use: No  . Sexual Activity: Not on file   Other Topics Concern  .  Not on file   Social History Narrative  . No narrative on file    Past Surgical History  Procedure Laterality Date  . Breast surgery      biopsy, right  . Uterine fibroid surgery      Family History  Problem Relation Age of Onset  . Sarcoidosis Brother   . Kidney failure Father   . Hypertension Mother   . Diabetes Mother   . Heart disease Mother     Allergies  Allergen Reactions  . Sulfonamide Derivatives Hives    Current Outpatient Prescriptions on File Prior to Visit  Medication Sig Dispense Refill  . ferrous sulfate 325 (65 FE) MG tablet Take 1 tablet (325 mg total) by mouth daily with breakfast.  30 tablet  3  . levothyroxine (SYNTHROID, LEVOTHROID) 112 MCG tablet TAKE TWO TABLETS BY MOUTH EVERY DAY  60 tablet  0   No current facility-administered medications on file prior to visit.    BP 140/84  Pulse 84  Temp(Src) 99.2 F (37.3 C) (Oral)  Ht 5' 7.25" (1.708 m)  Wt 272 lb (123.378 kg)  BMI 42.29 kg/m2    Objective:   Physical Exam  Constitutional: She is oriented to person, place, and time. She appears well-developed and well-nourished. No distress.  HENT:  Head: Normocephalic and atraumatic.  Right Ear: External ear normal.  Left Ear: External ear normal.  Cardiovascular: Normal rate, regular rhythm and normal  heart sounds.   No murmur heard. Pulmonary/Chest: Effort normal and breath sounds normal. She has no wheezes.  Musculoskeletal: She exhibits no edema.  Neurological: She is alert and oriented to person, place, and time. No cranial nerve deficit.  Skin: Skin is warm and dry.  Psychiatric: She has a normal mood and affect. Her behavior is normal.       Assessment & Plan:

## 2013-06-02 NOTE — Assessment & Plan Note (Signed)
Patient has history of iron deficiency anemia. She complains of daytime somnolence. Repeat CBC.

## 2013-06-02 NOTE — Progress Notes (Signed)
Pre visit review using our clinic review tool, if applicable. No additional management support is needed unless otherwise documented below in the visit note. 

## 2013-06-02 NOTE — Assessment & Plan Note (Signed)
Patient complains of daytime somnolence despite 8-9 hours of sleep. Patient multiple risk factors for obstructive sleep apnea. Arrange screening in-home sleep study.

## 2013-06-02 NOTE — Assessment & Plan Note (Signed)
Patient reports workup by neurologist-Dr. Anne HahnWillis was negative. Patient's symptoms resolved on its own. He discussed possibly obtaining MRI of lumbar spine if symptoms recur.

## 2013-06-02 NOTE — Assessment & Plan Note (Signed)
Monitor thyroid function studies. Adjust levothyroxine dose accordingly.

## 2013-06-02 NOTE — Assessment & Plan Note (Signed)
Patient encouraged to join [employer] and exercise a regular basis.

## 2013-06-05 LAB — TSH: TSH: 10.04

## 2013-06-12 ENCOUNTER — Other Ambulatory Visit: Payer: Self-pay | Admitting: Internal Medicine

## 2013-06-12 MED ORDER — LEVOTHYROXINE SODIUM 125 MCG PO TABS: 250.0000 ug | ORAL_TABLET | Freq: Every day | ORAL | Status: DC

## 2013-06-23 ENCOUNTER — Encounter: Payer: Self-pay | Admitting: Internal Medicine

## 2013-07-24 ENCOUNTER — Other Ambulatory Visit: Payer: Self-pay | Admitting: Internal Medicine

## 2013-07-24 DIAGNOSIS — Z1231 Encounter for screening mammogram for malignant neoplasm of breast: Secondary | ICD-10-CM

## 2013-08-02 ENCOUNTER — Ambulatory Visit (HOSPITAL_BASED_OUTPATIENT_CLINIC_OR_DEPARTMENT_OTHER)
Admission: RE | Admit: 2013-08-02 | Discharge: 2013-08-02 | Disposition: A | Discharge status: Home / Self Care | Payer: 59 | Source: Ambulatory Visit | Attending: Internal Medicine | Admitting: Internal Medicine

## 2013-08-02 DIAGNOSIS — R928 Other abnormal and inconclusive findings on diagnostic imaging of breast: Secondary | ICD-10-CM | POA: Insufficient documentation

## 2013-08-02 DIAGNOSIS — Z1231 Encounter for screening mammogram for malignant neoplasm of breast: Secondary | ICD-10-CM | POA: Insufficient documentation

## 2013-08-07 ENCOUNTER — Other Ambulatory Visit: Payer: Self-pay | Admitting: Internal Medicine

## 2013-08-07 DIAGNOSIS — R928 Other abnormal and inconclusive findings on diagnostic imaging of breast: Secondary | ICD-10-CM

## 2013-08-08 ENCOUNTER — Other Ambulatory Visit: Payer: 59

## 2013-08-15 ENCOUNTER — Other Ambulatory Visit: Payer: 59

## 2013-08-18 ENCOUNTER — Ambulatory Visit
Admission: RE | Admit: 2013-08-18 | Discharge: 2013-08-18 | Disposition: A | Discharge status: Home / Self Care | Payer: 59 | Source: Ambulatory Visit | Attending: Internal Medicine | Admitting: Internal Medicine

## 2013-08-18 ENCOUNTER — Other Ambulatory Visit: Payer: 59

## 2013-08-18 ENCOUNTER — Telehealth: Payer: Self-pay | Admitting: Internal Medicine

## 2013-08-18 ENCOUNTER — Encounter (INDEPENDENT_AMBULATORY_CARE_PROVIDER_SITE_OTHER): Payer: Self-pay

## 2013-08-18 ENCOUNTER — Other Ambulatory Visit (INDEPENDENT_AMBULATORY_CARE_PROVIDER_SITE_OTHER): Payer: 59

## 2013-08-18 DIAGNOSIS — E039 Hypothyroidism, unspecified: Secondary | ICD-10-CM

## 2013-08-18 DIAGNOSIS — R928 Other abnormal and inconclusive findings on diagnostic imaging of breast: Secondary | ICD-10-CM

## 2013-08-18 NOTE — Telephone Encounter (Signed)
[facility]

## 2013-08-23 LAB — LIPID PANEL
CHOLESTEROL: 169 mg/dL (ref 0–200)
HDL: 58 mg/dL (ref 35–70)
LDL Cholesterol: 100 mg/dL
LDl/HDL Ratio: 2.9
Triglycerides: 53 mg/dL (ref 40–160)

## 2013-08-23 LAB — BASIC METABOLIC PANEL
CREATININE: 0.9 mg/dL (ref <=1.1)
Glucose: 85 mg/dL

## 2013-08-23 LAB — HEMOGLOBIN A1C: Hgb A1c MFr Bld: 5.7 % (ref 4.0–6.0)

## 2013-08-23 LAB — TSH: TSH: 0.44 u[IU]/mL (ref 0.41–5.90)

## 2013-08-25 NOTE — Telephone Encounter (Signed)
Labs were sent will call and get results

## 2013-08-29 ENCOUNTER — Encounter: Payer: Self-pay | Admitting: Internal Medicine

## 2013-08-29 LAB — TSH
A1c: 5.7
Cholesterol, Total: 169
Creat: 0.87
GLUCOSE: 85
HDL: 58 mg/dL (ref 35–70)
LDL CALC: 100
TSH: 0.443
Triglycerides: 53
VLDL: 11 mg/dL

## 2013-09-06 ENCOUNTER — Telehealth: Payer: Self-pay | Admitting: *Deleted

## 2013-09-06 NOTE — Telephone Encounter (Signed)
Left message for pt to call back  °

## 2013-09-06 NOTE — Telephone Encounter (Signed)
Message copied by Jacqualyn PoseyWYRICK, CINDY D on Wed Sep 06, 2013  9:37 AM ------      Message from: Meda CoffeeYOO, DOE-HYUN R      Created: Tue Aug 29, 2013  9:40 AM       Call pt - recent blood work shows TSH essentially normal (TSH slightly suppressed at 0.443).  I recommend she continue same dose of thyroid medication.  Call in 3 month supply with 1 RF.  She will need repeat TSH in 6 months.  Other labs are normal.  I am assuming she has a copy from outside lab - [facility]. ------

## 2013-09-07 ENCOUNTER — Encounter: Payer: Self-pay | Admitting: *Deleted

## 2013-09-07 NOTE — Telephone Encounter (Signed)
Mailed letter °

## 2014-04-09 ENCOUNTER — Ambulatory Visit (INDEPENDENT_AMBULATORY_CARE_PROVIDER_SITE_OTHER): Payer: 59 | Admitting: Internal Medicine

## 2014-04-09 ENCOUNTER — Encounter: Payer: Self-pay | Admitting: Internal Medicine

## 2014-04-09 VITALS — BP 132/90 | HR 77 | Temp 98.1°F | Ht 67.25 in | Wt 261.0 lb

## 2014-04-09 DIAGNOSIS — D509 Iron deficiency anemia, unspecified: Secondary | ICD-10-CM

## 2014-04-09 DIAGNOSIS — R400 Somnolence: Secondary | ICD-10-CM

## 2014-04-09 DIAGNOSIS — E038 Other specified hypothyroidism: Secondary | ICD-10-CM

## 2014-04-09 DIAGNOSIS — Z8249 Family history of ischemic heart disease and other diseases of the circulatory system: Secondary | ICD-10-CM

## 2014-04-09 DIAGNOSIS — E032 Hypothyroidism due to medicaments and other exogenous substances: Secondary | ICD-10-CM

## 2014-04-09 NOTE — Progress Notes (Signed)
Pre visit review using our clinic review tool, if applicable. No additional management support is needed unless otherwise documented below in the visit note. 

## 2014-04-09 NOTE — Assessment & Plan Note (Signed)
Monitor TFTs Lab Results  Component Value Date   TSH 0.44 08/23/2013

## 2014-04-09 NOTE — Progress Notes (Signed)
Subjective:    Patient ID: [name], female    DOB: March 23, 1965, 49 y.o.   MRN: [id number]  HPI  49 year old African-American female with a genetic hypothyroidism  for routine follow-up.  Patient reports dealing with significant life stressors since previous visit.She has become primary caregiver for mother with multiple health issues. Her mother suffered from heart attack in November 2015. She also has poorly controlled diabetes and required vascular surgery for peripheral artery disease threatening lower extremity.    Her brother also had stroke.  Both mother and brother are smokers.  She has managed to lose some weight.  She is coping and helping her family members.  She attributes her strength to her faith.  She never complete home sleep study.  Review of Systems  negative for chest pain.    Past Medical History  Diagnosis Date  . Multinodular goiter     s/p radioactive I131 ablation, Iatogenic Hypothyroidism  . IBS (irritable bowel syndrome)   . Asthma     childhood  . Obesity   . Hypothyroidism   . Personal history of goiter     with radioactive iodine ablation  . 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 Marital Status: Single    Spouse Name: N/A  . Number of Children: 0  . Years of Education: 16   Occupational History  . 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 Smoking status: Never Smoker   . Smokeless tobacco: Never Used  . Alcohol Use: No  . Drug Use: No  . Sexual Activity: Not on file   Other Topics Concern  . Not on file   Social History Narrative    Past Surgical History  Procedure Laterality Date  . Breast surgery      biopsy, right  . Uterine fibroid surgery      Family History  Problem Relation Age of Onset  . Sarcoidosis Brother   . Kidney failure Father   . Hypertension Mother   . Diabetes Mother   . Heart disease Mother      Allergies  Allergen Reactions  . Sulfonamide Derivatives Hives    Current Outpatient Prescriptions on File Prior to Visit  Medication Sig Dispense Refill  . ferrous sulfate 325 (65 FE) MG tablet Take 1 tablet (325 mg total) by mouth daily with breakfast. 30 tablet 3  . levothyroxine (SYNTHROID, LEVOTHROID) 125 MCG tablet Take 2 tablets (250 mcg total) by mouth daily before breakfast. TAKE TWO TABLETS BY MOUTH EVERY DAY 60 tablet 5  . meloxicam (MOBIC) 15 MG tablet Take 1 tablet (15 mg total) by mouth daily as needed. 30 tablet 2   No current facility-administered medications on file prior to visit.    BP 132/90 mmHg  Pulse 77  Temp(Src) 98.1 F (36.7 C) (Oral)  Ht 5' 7.25" (1.708 m)  Wt 261 lb (118.389 kg)  BMI 40.58 kg/m2    Objective:   Physical Exam  Constitutional: She is oriented to person, place, and time. She appears well-developed and well-nourished.  Cardiovascular: Normal rate, regular rhythm and normal heart sounds.   No murmur heard. Pulmonary/Chest: Effort normal and breath sounds normal. She has no wheezes.  Neurological: She is alert and oriented to person, place, and time.  Psychiatric: She has a normal mood and affect. Her behavior is normal.  Assessment & Plan:

## 2014-04-09 NOTE — Assessment & Plan Note (Signed)
Repeat FLP and check CRP

## 2014-04-09 NOTE — Assessment & Plan Note (Signed)
Monitor CBCD. 

## 2014-04-11 ENCOUNTER — Telehealth: Payer: Self-pay | Admitting: Internal Medicine

## 2014-04-11 MED ORDER — LEVOTHYROXINE SODIUM 137 MCG PO TABS: 274.0000 ug | ORAL_TABLET | Freq: Every day | ORAL | Status: DC

## 2014-04-11 NOTE — Telephone Encounter (Signed)
Please call pt - blood work pt needs higher dose of thyroid replacement.  Confirm she has not missed any doses.  I called in higher dose 137 mcg 2 tabs once daily to her pharmacy.  Repeat TSH in 2 months.  She gets her blood work done at [facility].  She will needs paper script

## 2014-04-25 ENCOUNTER — Encounter: Payer: Self-pay | Admitting: Internal Medicine

## 2014-04-30 ENCOUNTER — Other Ambulatory Visit: Payer: Self-pay | Admitting: Internal Medicine

## 2014-06-06 ENCOUNTER — Other Ambulatory Visit: Payer: Self-pay | Admitting: Internal Medicine

## 2014-07-22 ENCOUNTER — Other Ambulatory Visit: Payer: Self-pay | Admitting: Internal Medicine

## 2014-09-14 ENCOUNTER — Other Ambulatory Visit: Payer: Self-pay | Admitting: Internal Medicine

## 2014-09-17 ENCOUNTER — Other Ambulatory Visit: Payer: Self-pay | Admitting: Internal Medicine

## 2014-09-17 DIAGNOSIS — Z1231 Encounter for screening mammogram for malignant neoplasm of breast: Secondary | ICD-10-CM

## 2014-09-21 ENCOUNTER — Ambulatory Visit (HOSPITAL_BASED_OUTPATIENT_CLINIC_OR_DEPARTMENT_OTHER)
Admission: RE | Admit: 2014-09-21 | Discharge: 2014-09-21 | Disposition: A | Discharge status: Home / Self Care | Payer: 59 | Source: Ambulatory Visit | Attending: Internal Medicine | Admitting: Internal Medicine

## 2014-09-21 DIAGNOSIS — Z1231 Encounter for screening mammogram for malignant neoplasm of breast: Secondary | ICD-10-CM | POA: Diagnosis present

## 2014-11-08 ENCOUNTER — Other Ambulatory Visit: Payer: Self-pay | Admitting: Internal Medicine

## 2014-11-09 ENCOUNTER — Other Ambulatory Visit: Payer: Self-pay | Admitting: Internal Medicine

## 2015-05-07 ENCOUNTER — Encounter: Payer: Self-pay | Admitting: Podiatry

## 2015-05-07 ENCOUNTER — Ambulatory Visit (INDEPENDENT_AMBULATORY_CARE_PROVIDER_SITE_OTHER): Payer: 59 | Admitting: Podiatry

## 2015-05-07 ENCOUNTER — Ambulatory Visit (INDEPENDENT_AMBULATORY_CARE_PROVIDER_SITE_OTHER): Payer: 59

## 2015-05-07 ENCOUNTER — Other Ambulatory Visit: Payer: Self-pay | Admitting: Podiatry

## 2015-05-07 VITALS — BP 160/97 | HR 80 | Resp 18

## 2015-05-07 DIAGNOSIS — M7662 Achilles tendinitis, left leg: Secondary | ICD-10-CM | POA: Insufficient documentation

## 2015-05-07 DIAGNOSIS — R52 Pain, unspecified: Secondary | ICD-10-CM

## 2015-05-07 DIAGNOSIS — M7732 Calcaneal spur, left foot: Secondary | ICD-10-CM | POA: Diagnosis not present

## 2015-05-07 DIAGNOSIS — M7730 Calcaneal spur, unspecified foot: Secondary | ICD-10-CM | POA: Insufficient documentation

## 2015-05-07 NOTE — Progress Notes (Signed)
   Subjective:    Patient ID: [name], female    DOB: 1965/04/07, 50 y.o.   MRN: [id number]  HPI I HAVE SOME HEEL PAIN ON THE BACK OF MY LEFT FOOT AND IT HAS BEEN GOING ON FOR ABOUT A MONTH AND HURTS WHEN I GET UP AND IF I AM ON IT TOO LONG    Review of Systems  All other systems reviewed and are negative.      Objective:   Physical Exam        Assessment & Plan:

## 2015-05-07 NOTE — Patient Instructions (Signed)

## 2015-05-07 NOTE — Progress Notes (Signed)
Subjective:     Patient ID: Liberty HandyChristina Chiaramonte, female   DOB: December 10, 1965, 50 y.o.   MRN: [id number]  HPI 50 year old female presents to the office of heel pin to the left foot which has been ongoing for about 1 month. She believes this started after buying "cheap shoes". She states it is worse in the morning when she first gets up but after she gets moving, it does get better. No history of injury or trauma. No swelling or redness. No tingling or numbness. Describes it as a throbbing pain. She has purchased new balance shoes but did not help. She did wear an ankle brace which helped some. She wears this during the day as she works at [employer] and is up and down all day.   Review of Systems  All other systems reviewed and are negative.      Objective:   Physical Exam General: AAO x3, NAD  Dermatological: Skin is warm, dry and supple bilateral. Nails x 10 are well manicured; remaining integument appears unremarkable at this time. There are no open sores, no preulcerative lesions, no rash or signs of infection present.  Vascular: Dorsalis Pedis artery and Posterior Tibial artery pedal pulses are 2/4 bilateral with immedate capillary fill time. Pedal hair growth present. No varicosities and no lower extremity edema present bilateral. There is no pain with calf compression, swelling, warmth, erythema.   Neruologic: Grossly intact via light touch bilateral. Vibratory intact via tuning fork bilateral. Protective threshold with Semmes Wienstein monofilament intact to all pedal sites bilateral. Patellar and Achilles deep tendon reflexes 2+ bilateral. No Babinski or clonus noted bilateral.   Musculoskeletal: There is tenderness palpation of the posterior aspect of left calcaneus on a prominent retrocalcaneal exostosis. This tenderness on the distal portion the Achilles tendon on the insertion of the calcaneus. Thompson test is negative. There is no defect noted within the Achilles tendon. No tenderness of  plantar aspect of the calcaneus. No pain with medial to lateral compression of calcaneus. No pain vibratory sensation. No other areas of tenderness. Equinus is present. MMT 5/5.  Gait: Unassisted, Nonantalgic.      Assessment:     Achilles tendinitis, retrocalcaneal exostosis left     Plan:     -Treatment options discussed including all alternatives, risks, and complications -Etiology of symptoms were discussed -X-rays were obtained and reviewed with the patient. Posterior and inferior Calcaneal spurring is present. There is a sclerotic area in the tubercle on the calcaneal axial view however there is no overlying swelling, pain vibratory sensation pain with lateral compression of the calcaneus. Stress fracture unlikely. -Discussed stretching exercises. -Night splint -Continue meloxicam for which she has her taking -Order compound cream -Shoe gear changes -Follow-up in 4 weeks or sooner if any problems arise. In the meantime, encouraged to call the office with any questions, concerns, change in symptoms.   Ovid CurdMatthew Wagoner, DPM

## 2015-05-09 DIAGNOSIS — E039 Hypothyroidism, unspecified: Secondary | ICD-10-CM | POA: Insufficient documentation

## 2015-06-04 ENCOUNTER — Ambulatory Visit (INDEPENDENT_AMBULATORY_CARE_PROVIDER_SITE_OTHER): Payer: 59 | Admitting: Podiatry

## 2015-06-04 DIAGNOSIS — M7662 Achilles tendinitis, left leg: Secondary | ICD-10-CM

## 2015-06-04 DIAGNOSIS — M722 Plantar fascial fibromatosis: Secondary | ICD-10-CM | POA: Diagnosis not present

## 2015-06-04 MED ORDER — METHYLPREDNISOLONE 4 MG PO TBPK: ORAL_TABLET | ORAL | Status: DC

## 2015-06-04 NOTE — Patient Instructions (Signed)
Start medrol dose pack. Once complete can restart meloxicam but do not take together

## 2015-06-08 NOTE — Progress Notes (Signed)
Patient ID: [name], female   DOB: 1965-08-19, 50 y.o.   MRN: [id number]  Subjective: 50 year old female presents the office today for follow-up evaluation of continued pain to left Achilles tendon as well as pain to the bottom of her heel. She has been stretching icing as well as wearing better shoes. She continues to get pain to her heel. This is mostly the end of the day to the back of the heel in the morning the bottom of the heel. Denies any systemic complaints such as fevers, chills, nausea, vomiting. No acute changes since last appointment, and no other complaints at this time.   Objective: AAO x3, NAD DP/PT pulses palpable bilaterally, CRT less than 3 seconds Protective sensation intact with Simms Weinstein monofilament There is tenderness patient in the plantar medial tubercle of the calcaneus at the insertion the plantar fashion the left heel. There is no pain along the course of plantar fascial in the arch the foot. Plantar fashion. The be intact. No pain with medial to lateral compression of calcaneus. There is continuation of discomfort on the posterior aspect of the Achilles tendon on the insertion the calcaneus. Thompson test is negative. Tendon appears to be intact. No areas of pinpoint bony tenderness or pain with vibratory sensation. MMT 5/5, ROM WNL. No edema, erythema, increase in warmth to bilateral lower extremities.  No open lesions or pre-ulcerative lesions.  No pain with calf compression, swelling, warmth, erythema  Assessment: Left Achilles tendinitis, plantar fasciitis  Plan: -All treatment options discussed with the patient including all alternatives, risks, complications.  -Patient elects to proceed with steroid injection into the left plantar heel. Under sterile skin preparation, a total of 2.5cc of kenalog 10, 0.5% Marcaine plain, and 2% lidocaine plain were infiltrated into the symptomatic area without complication. A band-aid was applied. Patient  tolerated the injection well without complication. Post-injection care with discussed with the patient. Discussed with the patient to ice the area over the next couple of days to help prevent a steroid flare.  -Prescribed Medrol Dosepak. Discussed side effects the medication. She understands. -Continue stretching, icing, night splint. Discussed physical therapy however she wishes to hold off. Also discussed custom orthotics. -Follow-up 3 weeks -Patient encouraged to call the office with any questions, concerns, change in symptoms.   Ovid CurdMatthew Sisto Granillo, DPM

## 2015-08-13 ENCOUNTER — Ambulatory Visit (INDEPENDENT_AMBULATORY_CARE_PROVIDER_SITE_OTHER): Payer: 59 | Admitting: Family Medicine

## 2015-08-13 ENCOUNTER — Encounter: Payer: Self-pay | Admitting: Family Medicine

## 2015-08-13 VITALS — BP 140/80 | HR 79 | Temp 98.2°F | Resp 12 | Ht 67.25 in | Wt 283.0 lb

## 2015-08-13 DIAGNOSIS — Z6841 Body Mass Index (BMI) 40.0 and over, adult: Secondary | ICD-10-CM | POA: Diagnosis not present

## 2015-08-13 DIAGNOSIS — M2550 Pain in unspecified joint: Secondary | ICD-10-CM

## 2015-08-13 DIAGNOSIS — D509 Iron deficiency anemia, unspecified: Secondary | ICD-10-CM | POA: Insufficient documentation

## 2015-08-13 DIAGNOSIS — E032 Hypothyroidism due to medicaments and other exogenous substances: Secondary | ICD-10-CM | POA: Diagnosis not present

## 2015-08-13 DIAGNOSIS — Z1211 Encounter for screening for malignant neoplasm of colon: Secondary | ICD-10-CM

## 2015-08-13 MED ORDER — LEVOTHYROXINE SODIUM 125 MCG PO TABS: ORAL_TABLET | ORAL | Status: DC

## 2015-08-13 NOTE — Progress Notes (Signed)
Pre visit review using our clinic review tool, if applicable. No additional management support is needed unless otherwise documented below in the visit note. 

## 2015-08-13 NOTE — Patient Instructions (Signed)
A few things to remember from today's visit:   Iatrogenic hypothyroidism - Plan: TSH, T4, free  BMI 40.0-44.9, adult (HCC)  Colon cancer screening - Plan: Ambulatory referral to Gastroenterology  Arthralgia of multiple sites, bilateral  Anemia, iron deficiency - Plan: CBC with Differential/Platelet, Ferritin  What are some tips for weight loss? People become overweight for many reasons. Weight issues can run in families. They can be caused by unhealthy behaviors and a person's environment. Certain health problems and medicines can also lead to weight gain. There are some simple things you can do to reach and maintain a healthy weight:  Eat 500 fewer calories per day than your body needs to maintain your weight. Women should aim for no more than 1,200 to 1,500 calories per day. Men should aim for 1,500 to 1,800 calories per day. Avoid sweet drinks. These include regular soft drinks, fruit juices, fruit drinks, energy drinks, sweetened iced tea, and flavored milk. Avoid fast foods. Fast foods such as french fries, hamburgers, chicken nuggets, and pizza are high in calories and can cause weight gain. Eat a healthy breakfast. People who skip breakfast tend to weigh more. Don't watch more than two hours of television per day. Chew sugar-free gum between meals to cut down on snacking. Avoid grocery shopping when you're hungry. Pack a healthy lunch instead of eating out to control what and how much you eat. Eat a lot of fruits and vegetables. Aim for about 2 cups of fruit and 2 to 3 cups of vegetables per day. Aim for 150 minutes per week of moderate-intensity exercise (such as brisk walking), or 75 minutes per week of vigorous exercise (such as jogging or running). Be more active. Small changes in physical activity can easily be added to your daily routine. For example, take the stairs instead of the elevator. Take a walk with your family. A daily walk is a great way to get exercise and to catch  up on the day's events.    Please be sure medication list is accurate. If a new problem present, please set up appointment sooner than planned today.

## 2015-08-13 NOTE — Progress Notes (Signed)
HPI:   [name] is a 50 y.o. female, who is here today to establish care with me.  Former PCP: Dr Shawna Orleans Last preventive routine visit: 2016, not sure. Last gyn examination 2-3 years. LMP 2 weeks ago.   Concerns today:  Thyroid lab.  Hypothyroidism:  S/P radioactive iodine treatment for hyperthyroidism, "goiter."  Currently she is on Levothyroxine 125 mg 2 tabs daily. Tolerating medication well, no side effects reported. She has not noted dysphagia, palpitations, abdominal pain, changes in bowel habits, tremor, cold/heat intolerance, or abnormal weight loss. + Fatigue for about 4-6 weeks. No problems with sleep and denies any loud snoring or apnea.   Lab Results  Component Value Date   TSH 0.44 08/23/2013    Hx of anemia, she had pica until a month ago, craving for ice. She stopped Fe Sulfate months ago because constipation. She has not had a colonoscopy done.  Denies abdominal pain, nausea, vomiting, changes in bowel habits, blood in stool or melena. Bother with ? of metastasic colon cancer.  -Arthralgias, for years stiffness and lower extremity joint pain, which is exacerbated by movement after prolonged rest. Usually when she gets up in the middle of the night to use the bathroom and in the morning.  Pain and stiffness is alleviated by movement. She has not noted joint edema or erythema.    She denies back pain ,numbness, or tingling. Currently she is on Mobic 15 mg daily, which she takes as needed and recently prescribed by podiatry to treat Achilles tendinitis. She used Mobic before, it was prescribed by her former PCP for joint pain and medication helps with pain. She is tolerating medication well and denies any side effect. No limitation on her daily activities due to pain.  -Right ankle edema she noted a few weeks ago, mainly at the end of the day after prolonged standing. She denies calf pain or erythema, history of varicose veins, she has an  appointment to have measures done for compression stockings.  Trying to eat healthy, change her diet recently, and not exercising.   Lab Results  Component Value Date   WBC 4.4 06/02/2013   HGB 10.6* 06/02/2013   HCT 33* 06/02/2013   MCV 82.2 02/23/2012   PLT 242.0 02/23/2012      Review of Systems  Constitutional: Positive for appetite change (Decreased) and fatigue. Negative for fever, activity change and unexpected weight change.  HENT: Negative for mouth sores, nosebleeds, trouble swallowing and voice change.   Eyes: Negative for redness and visual disturbance.  Respiratory: Negative for apnea, cough, shortness of breath and wheezing.   Cardiovascular: Negative for chest pain, palpitations and leg swelling.  Gastrointestinal: Negative for nausea, vomiting and abdominal pain.       Negative for changes in bowel habits.  Endocrine: Negative for cold intolerance and heat intolerance.  Genitourinary: Negative for dysuria, hematuria, decreased urine volume and difficulty urinating.  Musculoskeletal: Positive for arthralgias. Negative for myalgias, back pain and joint swelling.  Skin: Negative for color change and rash.  Neurological: Negative for seizures, syncope, weakness, numbness and headaches.  Psychiatric/Behavioral: Negative for confusion. The patient is not nervous/anxious.       Current Outpatient Prescriptions on File Prior to Visit  Medication Sig Dispense Refill  . meloxicam (MOBIC) 15 MG tablet TAKE ONE TABLET BY MOUTH ONCE DAILY AS NEEDED 30 tablet 5   No current facility-administered medications on file prior to visit.     Past Medical History  Diagnosis Date  . Multinodular goiter     s/p radioactive I131 ablation, Iatogenic Hypothyroidism  . IBS (irritable bowel syndrome)   . Asthma     childhood  . Obesity   . Hypothyroidism   . Personal history of goiter     with radioactive iodine ablation  . Alteration in sensory perception as evidenced by  illusions     Lower extremities   Allergies  Allergen Reactions  . Sulfonamide Derivatives Hives    Family History  Problem Relation Age of Onset  . Sarcoidosis Brother   . Kidney failure Father   . Hypertension Mother   . Diabetes Mother   . Heart disease Mother     Social History   Social History  . Marital Status: Single    Spouse Name: N/A  . Number of Children: 0  . Years of Education: 16   Occupational History  . [employer]   Social History Main Topics  . Smoking status: Never Smoker   . Smokeless tobacco: Never Used  . Alcohol Use: No  . Drug Use: No  . Sexual Activity: Not Asked   Other Topics Concern  . None   Social History Narrative    Filed Vitals:   08/13/15 1050  BP: 140/80  Pulse: 79  Temp: 98.2 F (36.8 C)  Resp: 12    Body mass index is 44 kg/(m^2).   SpO2 Readings from Last 3 Encounters:  08/13/15 98%  02/03/12 98%  02/01/12 99%      Physical Exam  Nursing note and vitals reviewed. Constitutional: She is oriented to person, place, and time. She appears well-developed. No distress.  HENT:  Head: Atraumatic.  Mouth/Throat: Oropharynx is clear and moist and mucous membranes are normal.  Eyes: Conjunctivae and EOM are normal. Pupils are equal, round, and reactive to light.  Neck: No tracheal deviation present. Thyromegaly present.  Cardiovascular: Normal rate and regular rhythm.   No murmur heard. Pulses:      Dorsalis pedis pulses are 2+ on the right side, and 2+ on the left side.  Respiratory: Effort normal and breath sounds normal. No respiratory distress.  GI: Soft. She exhibits no mass. There is no tenderness.  Musculoskeletal: She exhibits edema (trace LE pitting edema bilateral, peri-ankle R>L. Varicose veins bilateral).  Hips: Decreased flexion, pain elicited with motion.  Knees: Mild decreased flexion, moderate crepitus bilateral with ROM, no effusion or erythema appreciated.  Lymphadenopathy:    She  has no cervical adenopathy.  Neurological: She is alert and oriented to person, place, and time. She has normal strength. Coordination normal.  Skin: Skin is warm. No erythema.  Psychiatric: She has a normal mood and affect.  Well groomed, good eye contact.      ASSESSMENT AND PLAN:     [name] was seen today for [facility].  Diagnoses and all orders for this visit:  Arthralgia of multiple sites, bilateral  Most likely osteoarthritis. She can continue Mobic daily as needed, some side effects discussed. Weight loss might help. Low impact exercise, Tai Chi is a good option.  -     CBC with Differential/Platelet  Iatrogenic hypothyroidism  No changes in current management, will follow labs done today and will give further recommendations accordingly. Follow-up in 6-12 months.  -     TSH   -   T4, free -     levothyroxine (SYNTHROID, LEVOTHROID) 125 MCG tablet; TAKE TWO TABLETS BY MOUTH DAILY BEFORE  BREAKFAST  Anemia, iron deficiency  For the recommendations would be given according to lab results. For now she can continue with iron rich diet.     -    CBC with Differential/Platelet -     Ferritin  BMI 40.0-44.9, adult (HCC)  We discussed benefits of wt loss as well as adverse effects of obesity Consistency with healthy diet and physical activity recommended. Weight Watchers is a good option as well as daily brisk walking for 15-30 min as tolerated.   Colon cancer screening  Because AA screening is recommended at 34,  history of-deficiency anemia, and possible family history of colon cancer(? brother recently diagnosed).  -     Ambulatory referral to Gastroenterology        She would like to schedule her routine physical, so appointment scheduled in 3 months.  She also reports having lab done at work, she has not had received results yet, she was screened for diabetes and lipid disorder.        Tenae Graziosi G. [place], MD  [facility]. [place] office.

## 2015-08-14 LAB — CBC AND DIFFERENTIAL
HEMATOCRIT: 35 % — AB (ref 36–46)
Hemoglobin: 11.2 g/dL — AB (ref 12.0–16.0)
Neutrophils Absolute: 3 /uL
Platelets: 276 10*3/uL (ref 150–399)
WBC: 4.4 10*3/mL

## 2015-08-15 ENCOUNTER — Encounter: Payer: Self-pay | Admitting: Internal Medicine

## 2015-09-05 ENCOUNTER — Ambulatory Visit (INDEPENDENT_AMBULATORY_CARE_PROVIDER_SITE_OTHER): Payer: 59

## 2015-09-05 ENCOUNTER — Encounter: Payer: Self-pay | Admitting: Podiatry

## 2015-09-05 ENCOUNTER — Ambulatory Visit (INDEPENDENT_AMBULATORY_CARE_PROVIDER_SITE_OTHER): Payer: 59 | Admitting: Podiatry

## 2015-09-05 DIAGNOSIS — M7662 Achilles tendinitis, left leg: Secondary | ICD-10-CM | POA: Diagnosis not present

## 2015-09-05 DIAGNOSIS — R52 Pain, unspecified: Secondary | ICD-10-CM

## 2015-09-05 DIAGNOSIS — M84374A Stress fracture, right foot, initial encounter for fracture: Secondary | ICD-10-CM | POA: Diagnosis not present

## 2015-09-09 NOTE — Progress Notes (Signed)
Subjective: 50 year old female presents the office today for follow-up with vibration a left Achilles tendinitis. She states that pain is much better than what it was. She also present safer new concerns of pain to the right foot which is been ongoing the last couple weeks. She has pain when she tries to put pressure to her right foot and she has noticed swelling to the top of her right foot as well. She denies any recent injury or trauma to the area. No numbness or tingling. No previous treatment for the right foot. Denies any systemic complaints such as fevers, chills, nausea, vomiting. No acute changes since last appointment, and no other complaints at this time.   Objective: AAO x3, NAD DP/PT pulses palpable bilaterally, CRT less than 3 seconds There is decreased tenderness to the left Achilles tendon there is decreased tenderness to palpation on the posterior aspect of the toes tendon on the insertion into the calcaneus. Is no pain on the plantar medial tubercle of the calcaneus at the insertion of the plantar fascia. Thompson test negative. No defect noted. On the right foot there is areas of pinpoint bony tenderness on the third and fourth metatarsal heads. There is mild pain with vibratory sensation along this area as well. There is trace edema to the area without any erythema or increase in warmth. No areas of tenderness bilaterally. There is no pain in the interspaces no palpable neuroma identified today. No edema, erythema, increase in warmth to bilateral lower extremities.  No open lesions or pre-ulcerative lesions.  No pain with calf compression, swelling, warmth, erythema  Assessment: Resolving left Achilles tendinitis; right foot pain, possible stress fracture  Plan: -All treatment options discussed with the patient including all alternatives, risks, complications.  -X-ray is obtained and reviewed of the right foot. There is no definitive evidence of acute fracture. This question  wearing the fourth metatarsal but not definitive. -Given her clinical symptoms localized to the surgical center right foot to rule out stress fracture. Continue compression sock as well as was dispensed today. Ice to the area. -Continue range of motion, rehabilitation exercises for the left Achilles tendinitis. Continue supportive shoe gear and left side. -Follow-up as scheduled or sooner if needed  -Patient encouraged to call the office with any questions, concerns, change in symptoms.  *x-ray right foot next appointment   Ovid CurdMatthew Wagoner, DPM

## 2015-09-12 ENCOUNTER — Other Ambulatory Visit: Payer: Self-pay | Admitting: Internal Medicine

## 2015-09-13 ENCOUNTER — Other Ambulatory Visit: Payer: Self-pay | Admitting: *Deleted

## 2015-09-13 ENCOUNTER — Encounter: Payer: Self-pay | Admitting: Gastroenterology

## 2015-09-13 MED ORDER — MELOXICAM 15 MG PO TABS: ORAL_TABLET | ORAL | 1 refills | Status: DC

## 2015-09-26 ENCOUNTER — Encounter: Payer: Self-pay | Admitting: Podiatry

## 2015-09-26 ENCOUNTER — Ambulatory Visit (INDEPENDENT_AMBULATORY_CARE_PROVIDER_SITE_OTHER): Payer: 59 | Admitting: Podiatry

## 2015-09-26 DIAGNOSIS — M7662 Achilles tendinitis, left leg: Secondary | ICD-10-CM | POA: Diagnosis not present

## 2015-09-26 DIAGNOSIS — M79671 Pain in right foot: Secondary | ICD-10-CM | POA: Diagnosis not present

## 2015-09-26 MED ORDER — MELOXICAM 15 MG PO TABS: 15.0000 mg | ORAL_TABLET | Freq: Every day | ORAL | 2 refills | Status: DC

## 2015-09-27 NOTE — Progress Notes (Signed)
Subjective: 50 year old female presents the office they for follow-up evaluation of right foot pain. She's of the right foot is substantially improved compared to last appointment and to the table to wear regular shoe without any problems. She's tried to get some recurrence of pain to the left foot along the back which she points the Achilles tendon. She states that she has not been stretching or icing and she is asking for refill of meloxicam. Denies any recent injury or trauma. She stands all day at work and is on majority of her symptoms occur. No recent injury or trauma. Denies any systemic complaints such as fevers, chills, nausea, vomiting. No acute changes since last appointment, and no other complaints at this time.   Objective: AAO x3, NAD DP/PT pulses palpable bilaterally, CRT less than 3 seconds At this time there is no tender to palpation on the right foot on the third and fourth metatarsals or any areas of foot. There is no pain the vibratory sensation. There is no overlying edema, erythema, increase in warmth. On the left side there is tenderness along the posterior aspect the Achilles tendon on insertion into the calcaneus. Equinus is present. There is decrease in medial arch upon weight bearing. Thompson test is negative.   no other areas of tenderness bilateral.No edema, erythema, increase in warmth to bilateral lower extremities.  No open lesions or pre-ulcerative lesions.  No pain with calf compression, swelling, warmth, erythema  Assessment:  Resolving right foot pain with recurrence of left tendinitis  Plan: -All treatment options discussed with the patient including all alternatives, risks, complications.  -Hold off on further x-rays today on the right foot as her symptoms are much improved. If any symptoms continue to follow-up. Continue supportive shoe gear and orthotics. -On the left side and return to stretching, icing exercises daily. Refilled meloxicam and discussed side  effects and to take this only as needed. -Follow up in symptoms continue the next 6 weeks or sooner if needed. *Discussed steroid injection left foot with cam boot if needed next appointment. -Patient encouraged to call the office with any questions, concerns, change in symptoms.   Ovid CurdMatthew Nathaniel Wakeley, DPM

## 2015-10-15 ENCOUNTER — Other Ambulatory Visit: Payer: Self-pay | Admitting: Family Medicine

## 2015-10-15 DIAGNOSIS — Z1231 Encounter for screening mammogram for malignant neoplasm of breast: Secondary | ICD-10-CM

## 2015-10-17 ENCOUNTER — Ambulatory Visit (HOSPITAL_BASED_OUTPATIENT_CLINIC_OR_DEPARTMENT_OTHER)
Admission: RE | Admit: 2015-10-17 | Discharge: 2015-10-17 | Disposition: A | Discharge status: Home / Self Care | Payer: 59 | Source: Ambulatory Visit | Attending: Family Medicine | Admitting: Family Medicine

## 2015-10-17 DIAGNOSIS — Z1231 Encounter for screening mammogram for malignant neoplasm of breast: Secondary | ICD-10-CM | POA: Insufficient documentation

## 2015-10-31 ENCOUNTER — Ambulatory Visit: Payer: 59 | Admitting: Podiatry

## 2015-11-19 ENCOUNTER — Ambulatory Visit (INDEPENDENT_AMBULATORY_CARE_PROVIDER_SITE_OTHER): Payer: 59 | Admitting: Family Medicine

## 2015-11-19 ENCOUNTER — Encounter: Payer: Self-pay | Admitting: Family Medicine

## 2015-11-19 VITALS — BP 130/78 | HR 71 | Resp 12 | Ht 67.25 in | Wt 280.2 lb

## 2015-11-19 DIAGNOSIS — M2550 Pain in unspecified joint: Secondary | ICD-10-CM | POA: Diagnosis not present

## 2015-11-19 DIAGNOSIS — E032 Hypothyroidism due to medicaments and other exogenous substances: Secondary | ICD-10-CM

## 2015-11-19 DIAGNOSIS — D509 Iron deficiency anemia, unspecified: Secondary | ICD-10-CM

## 2015-11-19 DIAGNOSIS — Z6841 Body Mass Index (BMI) 40.0 and over, adult: Secondary | ICD-10-CM

## 2015-11-19 DIAGNOSIS — Z23 Encounter for immunization: Secondary | ICD-10-CM

## 2015-11-19 NOTE — Patient Instructions (Addendum)
A few things to remember from today's visit:   Need for immunization against influenza - Plan: Flu Vaccine QUAD 36+ mos IM  Iron deficiency anemia, unspecified iron deficiency anemia type - Plan: CBC, Ferritin  Arthralgia of multiple sites, bilateral  Iatrogenic hypothyroidism - Plan: Lipid panel, Basic metabolic panel, TSH, T4, free  BMI 40.0-44.9, adult (HCC) - Plan: Lipid panel, Basic metabolic panel  Continue working on a healthy diet and regular exercise.  Keep appointment with gastroenterologist for colonoscopy.  15-30 minutes of brisk walking daily. Take Mobic daily as needed.  Please be sure medication list is accurate. If a new problem present, please set up appointment sooner than planned today.

## 2015-11-19 NOTE — Progress Notes (Signed)
HPI:   [name] is a 50 y.o. female, who is here today to follow on some of her chronic medical problems and last OV, 08/13/15.    Arthralgias: Tries not to take Meloxicam daily but with cold weather she is having more symptoms. Pain exacerbated by movement after prolonged sitting and first thing in the morning when she first gets up, stiffness. Alleviated by movement. Foot pain, right achilles tendinitis, has improved. Fatigue has improved with exercise and healthy diet.  Since her last OV she has changed some dietary habits: Portion control, cooking at home; also she is exercising 3 times per week, gave up sodas and juices. IBS: Increased fiber intake and constipation has improved.   Hx of iron deficiency anemia, she is taking Fe Sulfate and has tolerated better. She has not noted blood in stool or gross hematuria. Not longer having pica. Colonoscopy has been already arranged.  LMP 3 days ago  Hypothyroidism: She is currently on Levothyroxine 125 mcg 2 tabs daily. She missed medication sometimes. She has not noted tremor, diarrhea, or cold/heat intolerance.   Review of Systems  Constitutional: Negative for activity change, appetite change, fever and unexpected weight change.  HENT: Negative for mouth sores, nosebleeds and trouble swallowing.   Eyes: Negative for redness and visual disturbance.  Respiratory: Negative for cough, shortness of breath and wheezing.   Cardiovascular: Negative for chest pain, palpitations and leg swelling.  Gastrointestinal: Negative for abdominal pain, blood in stool, nausea and vomiting.       Constipation improved.  Endocrine: Negative for cold intolerance and heat intolerance.  Genitourinary: Negative for decreased urine volume, difficulty urinating and hematuria.  Musculoskeletal: Positive for arthralgias. Negative for back pain and gait problem.  Neurological: Negative for syncope, weakness and headaches.    Psychiatric/Behavioral: Negative for confusion and sleep disturbance. The patient is not nervous/anxious.       Current Outpatient Prescriptions on File Prior to Visit  Medication Sig Dispense Refill  . levothyroxine (SYNTHROID, LEVOTHROID) 125 MCG tablet TAKE TWO TABLETS BY MOUTH DAILY BEFORE  BREAKFAST 180 tablet 1  . meloxicam (MOBIC) 15 MG tablet TAKE ONE TABLET BY MOUTH ONCE DAILY AS NEEDED 30 tablet 5   No current facility-administered medications on file prior to visit.      Past Medical History:  Diagnosis Date  . 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 Asthma    childhood  . Hypothyroidism   . IBS (irritable bowel syndrome)   . Multinodular goiter    s/p radioactive I131 ablation, Iatogenic Hypothyroidism  . Obesity   . Personal history of goiter    with radioactive iodine ablation   Allergies  Allergen Reactions  . Sulfonamide Derivatives Hives    Social History   Social History  . Marital status: Single    Spouse name: N/A  . Number of children: 0  . Years of education: 7116   Occupational History  . 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 Smoking status: Never Smoker  . Smokeless tobacco: Never Used  . Alcohol use No  . Drug use: No  . Sexual activity: Not Asked   Other Topics Concern  . None   Social History Narrative  . None    Vitals:   11/19/15 0841  BP: 130/78  Pulse: 71  Resp: 12   Body mass index is 43.57 kg/m.   Wt Readings from Last 3  Encounters:  11/19/15 280 lb 4 oz (127.1 kg)  08/13/15 283 lb (128.4 kg)  04/09/14 261 lb (118.4 kg)      Physical Exam  Nursing note and vitals reviewed. Constitutional: She is oriented to person, place, and time. She appears well-developed. No distress.  HENT:  Head: Atraumatic.  Mouth/Throat: Oropharynx is clear and moist and mucous membranes are normal.  Eyes: Conjunctivae and EOM are normal.  Neck: No tracheal  deviation present. Thyromegaly present. No thyroid mass present.  Cardiovascular: Normal rate and regular rhythm.   No murmur heard. Respiratory: Effort normal and breath sounds normal. No respiratory distress.  GI: Soft. She exhibits no mass. There is no hepatomegaly. There is no tenderness.  Musculoskeletal: She exhibits no edema.  Knee crepitus bilateral, mild limitation flexion. No signs of synovitis.  Neurological: She is alert and oriented to person, place, and time. She has normal strength. Coordination normal.  Skin: Skin is warm. No erythema.  Psychiatric: She has a normal mood and affect.  Well groomed, good eye contact.      ASSESSMENT AND PLAN:     Aviela was seen today for follow-up.  Diagnoses and all orders for this visit:  Arthralgia of multiple sites, bilateral  We discussed some side effects of NSAID's, she can take it daily if she has moderate pain. Wt loss cold also help. Low impact exercise.  Iron deficiency anemia, unspecified iron deficiency anemia type  No changes in current management, will follow labs done today and will give further recommendations accordingly. Keep appt with GI.  -     Ferritin  Iatrogenic hypothyroidism  No changes in current management, will follow labs done today and will give further recommendations accordingly.   -     TSH -     T4, free  BMI 40.0-44.9, adult (HCC)  She has lost about 3 pounds since her last OV. We discussed benefits of wt loss as well as adverse effects of obesity. Consistency with healthful diet and physical activity recommended. Continue working on working on a healthier life style.  -     Basic metabolic panel -     Lipid panel  Need for immunization against influenza -     Flu Vaccine QUAD 36+ mos IM    -[name] was advised to return sooner than planned today if new concerns arise.       Betty G. [place], MD  [facility]. [place]  office.

## 2015-11-20 ENCOUNTER — Ambulatory Visit (INDEPENDENT_AMBULATORY_CARE_PROVIDER_SITE_OTHER): Payer: 59 | Admitting: Gastroenterology

## 2015-11-20 ENCOUNTER — Encounter: Payer: Self-pay | Admitting: Gastroenterology

## 2015-11-20 VITALS — BP 120/90 | HR 71 | Ht 67.0 in | Wt 280.0 lb

## 2015-11-20 DIAGNOSIS — Z1211 Encounter for screening for malignant neoplasm of colon: Secondary | ICD-10-CM | POA: Diagnosis not present

## 2015-11-20 LAB — TSH: TSH: 19.83 u[IU]/mL — AB (ref 0.41–5.90)

## 2015-11-20 LAB — LIPID PANEL
CHOLESTEROL: 184 mg/dL (ref 0–200)
HDL: 56 mg/dL (ref 35–70)
LDL Cholesterol: 115 mg/dL
TRIGLYCERIDES: 65 mg/dL (ref 40–160)

## 2015-11-20 LAB — BASIC METABOLIC PANEL
BUN: 8 mg/dL (ref 4–21)
CREATININE: 0.8 mg/dL (ref 0.5–1.1)
Glucose: 79 mg/dL
POTASSIUM: 3.9 mmol/L (ref 3.4–5.3)
SODIUM: 140 mmol/L (ref 137–147)

## 2015-11-20 LAB — CBC AND DIFFERENTIAL
HEMATOCRIT: 37 % (ref 36–46)
Hemoglobin: 11.7 g/dL — AB (ref 12.0–16.0)
PLATELETS: 263 10*3/uL (ref 150–399)
WBC: 4.8 10^3/mL

## 2015-11-20 NOTE — Patient Instructions (Addendum)
You will be set up for a colonoscopy for routine risk colon cancer in African American. Ask your brother if Dr. Christella HartiganJacobs can look in his records to see if he has colon cancer, this information will be important for your colon cancer risk profile.   Please call when you are ready to set up your procedure.  [phone].

## 2015-11-20 NOTE — Progress Notes (Signed)
HPI: This is a  very pleasant 50 year old woman   who was referred to me by [place], Betty G, MD  to evaluate  colon cancer screening .    Chief complaint is colon cancer screening, high risk versus routine  Today his her 50th [birth year].  No GI bleeding, rare intermittent constipation (on iron supplemet).  Never CRC testing.  Her brother had colon cancer, stage 4 metastatic to lung.  She is actually not sure if this is colon cancer that has spread to the lung or a lung primary. He was definitely diagnosed with a colon mass about a year prior to his diagnosis of the lung tumor. He is taken care of here at [facility].    Review of systems: Pertinent positive and negative review of systems were noted in the above HPI section. Complete review of systems was performed and was otherwise normal.   Past Medical History:  Diagnosis Date  . 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 Asthma    childhood  . Hypothyroidism   . IBS (irritable bowel syndrome)   . Multinodular goiter    s/p radioactive I131 ablation, Iatogenic Hypothyroidism  . Obesity   . Personal history of goiter    with radioactive iodine ablation    Past Surgical History:  Procedure Laterality Date  . BREAST SURGERY     biopsy, right  . UTERINE FIBROID SURGERY      Current Outpatient Prescriptions  Medication Sig Dispense Refill  . levothyroxine (SYNTHROID, LEVOTHROID) 125 MCG tablet TAKE TWO TABLETS BY MOUTH DAILY BEFORE  BREAKFAST 180 tablet 1  . meloxicam (MOBIC) 15 MG tablet TAKE ONE TABLET BY MOUTH ONCE DAILY AS NEEDED 30 tablet 5   No current facility-administered medications for this visit.     Allergies as of 11/20/2015 - Review Complete 11/20/2015  Allergen Reaction Noted  . Sulfonamide derivatives Hives     Family History  Problem Relation Age of Onset  . Sarcoidosis Brother   . Kidney failure Father   . Hypertension Mother   . Diabetes Mother   . Heart  disease Mother     Social History   Social History  . Marital status: Single    Spouse name: N/A  . Number of children: 0  . Years of education: 316   Occupational History  . 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 Smoking status: Never Smoker  . Smokeless tobacco: Never Used  . Alcohol use No  . Drug use: No  . Sexual activity: Not on file   Other Topics Concern  . Not on file   Social History Narrative  . No narrative on file     Physical Exam: BP 120/90   Pulse 71   Ht 5\' 7"  (1.702 m)   Wt 280 lb (127 kg)   BMI 43.85 kg/m  Constitutional: generally well-appearing Psychiatric: alert and oriented x3 Eyes: extraocular movements intact Mouth: oral pharynx moist, no lesions Neck: supple no lymphadenopathy Cardiovascular: heart regular rate and rhythm Lungs: clear to auscultation bilaterally Abdomen: soft, nontender, nondistended, no obvious ascites, no peritoneal signs, normal bowel sounds Extremities: no lower extremity edema bilaterally Skin: no lesions on visible extremities   Assessment and plan: 50 y.o. female with  Routine (versus high risk) risk for colon cancer  Several guidelines recommend colon cancer screening should start at age 49 4 African Americans. She has never had colon cancer screening recommended we  proceed with colonoscopy at her soonest convenience. Her brother might have metastatic colon cancer. She is going to ask him if it is okay that I check his medical records to determine if that is the case so that we will better understand her personal risk profile, I can advise her better on timing of next colonoscopy based on that information.   Rob Bunting, MD [facility] 11/20/2015, 10:32 AM  Cc: [place], Betty G, MD

## 2015-11-22 ENCOUNTER — Telehealth: Payer: Self-pay

## 2015-11-22 DIAGNOSIS — R7989 Other specified abnormal findings of blood chemistry: Secondary | ICD-10-CM

## 2015-11-22 NOTE — Telephone Encounter (Signed)
So, no changes in dose of Levothyroxine 125 mcg 2 tabs daily. Be sure to take it 45-60 min before breakfast with empty stomach. Another option is to take med at bedtime no food or liquids 3 hours before. Will still re-check TSH in 8 weeks.

## 2015-11-22 NOTE — Telephone Encounter (Signed)
-----   Message from Betty G [place]Jordan, MD sent at 11/22/2015  1:08 PM EDT ----- Regarding: Lab results Lab done recently are back: TSH abnormal, 19.8. She is already on 250 mcg daily, so please verify she is in fact taking medication as instructed and with empty stomach. It needs to be increased, I would like brand name (Synthroid) 2.5 tabs (125 mcg tab) 3 times per week and rest of days continue 2 tabs daily. TSH and free T3  in 6-8 weeks. Rest of labs otherwise normal.  Thanks, BJ

## 2015-11-22 NOTE — Telephone Encounter (Signed)
Called and spoke with patient. She said that she will put her bottle where she will see it when she wakes up in the mornings. Lab order placed & lab appt scheduled.

## 2015-11-22 NOTE — Telephone Encounter (Signed)
Called and spoke with patient. Patient admitted that she has forgotten to take it a couple of days and sometimes she remembers after she has already eaten. I advised her to remember to take it & to make sure she's taking it on an empty stomach. Do you still want to send in a new Rx?

## 2016-01-17 ENCOUNTER — Other Ambulatory Visit: Payer: 59

## 2016-01-28 ENCOUNTER — Other Ambulatory Visit: Payer: Self-pay | Admitting: Internal Medicine

## 2016-02-25 ENCOUNTER — Ambulatory Visit (INDEPENDENT_AMBULATORY_CARE_PROVIDER_SITE_OTHER): Payer: 59 | Admitting: Family Medicine

## 2016-02-25 ENCOUNTER — Encounter: Payer: Self-pay | Admitting: Family Medicine

## 2016-02-25 ENCOUNTER — Other Ambulatory Visit (HOSPITAL_COMMUNITY)
Admission: RE | Admit: 2016-02-25 | Discharge: 2016-02-25 | Disposition: A | Discharge status: Home / Self Care | Payer: 59 | Source: Ambulatory Visit | Attending: Family Medicine | Admitting: Family Medicine

## 2016-02-25 VITALS — BP 124/80 | HR 71 | Resp 12 | Ht 67.0 in | Wt 284.0 lb

## 2016-02-25 DIAGNOSIS — Z1151 Encounter for screening for human papillomavirus (HPV): Secondary | ICD-10-CM | POA: Diagnosis not present

## 2016-02-25 DIAGNOSIS — Z0000 Encounter for general adult medical examination without abnormal findings: Secondary | ICD-10-CM | POA: Diagnosis not present

## 2016-02-25 DIAGNOSIS — D509 Iron deficiency anemia, unspecified: Secondary | ICD-10-CM

## 2016-02-25 DIAGNOSIS — N898 Other specified noninflammatory disorders of vagina: Secondary | ICD-10-CM

## 2016-02-25 DIAGNOSIS — Z01419 Encounter for gynecological examination (general) (routine) without abnormal findings: Secondary | ICD-10-CM | POA: Insufficient documentation

## 2016-02-25 DIAGNOSIS — E032 Hypothyroidism due to medicaments and other exogenous substances: Secondary | ICD-10-CM

## 2016-02-25 DIAGNOSIS — N938 Other specified abnormal uterine and vaginal bleeding: Secondary | ICD-10-CM | POA: Diagnosis not present

## 2016-02-25 DIAGNOSIS — Z124 Encounter for screening for malignant neoplasm of cervix: Secondary | ICD-10-CM | POA: Diagnosis not present

## 2016-02-25 DIAGNOSIS — R946 Abnormal results of thyroid function studies: Secondary | ICD-10-CM | POA: Diagnosis not present

## 2016-02-25 MED ORDER — METRONIDAZOLE 0.75 % VA GEL: 1.0000 | Freq: Every day | VAGINAL | 0 refills | Status: AC

## 2016-02-25 NOTE — Patient Instructions (Signed)
A few things to remember from today's visit:   Cervical cancer screening - Plan: Cytology - PAP ([facility])  Routine general medical examination at a health care facility - Plan: CBC, Cytology - PAP ()  Abnormal TSH - Plan: TSH    At least 150 minutes of moderate exercise per week, daily brisk walking for 15-30 min is a good exercise option. Healthy diet low in saturated (animal) fats and sweets and consisting of fresh fruits and vegetables, lean meats such as fish and white chicken and whole grains.   - Vaccines:  Tdap vaccine every 10 years.  Shingles vaccine recommended at age 50, could be given after 50 years of age but not sure about insurance coverage.  Pneumonia vaccines:  Prevnar 13 at 65 and Pneumovax at 66.  Screening recommendations for low/normal risk women:  Screening for diabetes at age 50-45 and every 3 years.  Cervical cancer prevention:  -HPV vaccination between 609-50 years old. -Pap smear starts at 50 years of age and continues periodically until 50 years old in low risk women. Pap smear every 3 years between 7221 and 50 years old. Pap smear every 3 years between women 30 and older if pap smear negative and HPV screening negative.   -Breast cancer: Mammogram: There is disagreement between experts about when to start screening in low risk asymptomatic female but recent recommendations are to start screening at 4440 and not later than 50 years old , every 1-2 years and after 50 yo q 2 years. Screening is recommended until 50 years old but some women can continue screening depending of healthy issues.   Colon cancer screening: starts at 50 years old until 50 years old.  Cholesterol disorder screening at age 50 and every 3 years.    Please be sure medication list is accurate. If a new problem present, please set up appointment sooner than planned today.

## 2016-02-25 NOTE — Progress Notes (Signed)
HPI:   [name] is a 51 y.o. female, who is here today for her routine physical.   She does not exercise regularly and does not follow a healthy diet consistently.  She lives alone.    Pap smear about 3 year Hx of abnormal pap smears: Denies Hx of STD's : Denies.  She denies sexual activity. G:0 M:9 LMP 01/26/16. For the past 2 months irregular menses with spotting a few days before her menstrual periods. Last time she noted some vaginal spotting was 3 days ago.   Mammogram: 10/22/15 [facility] 1 Colonoscopy :Pending   FHx for gynecologic negative. Brother Dx with colon cancer at 23. She had to cancel appt with GI for her colonoscopy.   -Today she is concerned about "fishy"odor vaginal discharge for the past few weeks. She has not noted pruritus or pain. She does vaginal douches  and frequent baths. She denies abdominal pain, nausea,vomiting,or urinary symptoms.     Review of Systems  Constitutional: Negative for appetite change, fatigue, fever and unexpected weight change.  HENT: Negative for dental problem, hearing loss, mouth sores, nosebleeds, trouble swallowing and voice change.   Eyes: Negative for photophobia, pain and visual disturbance.  Respiratory: Negative for cough, shortness of breath and wheezing.   Cardiovascular: Negative for chest pain, palpitations and leg swelling.  Gastrointestinal: Negative for abdominal pain, blood in stool, nausea and vomiting.       No changes in bowel habits.  Endocrine: Negative for cold intolerance, heat intolerance, polydipsia, polyphagia and polyuria.  Genitourinary: Positive for menstrual problem and vaginal discharge. Negative for decreased urine volume, difficulty urinating, dysuria, frequency, hematuria and vaginal pain.       No breast tenderness or nipple discharge.  Musculoskeletal: Negative for gait problem, joint swelling and myalgias.  Skin: Negative for rash.  Neurological: Negative for  syncope, weakness and headaches.  Hematological: Negative for adenopathy. Does not bruise/bleed easily.  Psychiatric/Behavioral: Negative for confusion and sleep disturbance. The patient is not nervous/anxious.   All other systems reviewed and are negative.     Current Outpatient Prescriptions on File Prior to Visit  Medication Sig Dispense Refill  . meloxicam (MOBIC) 15 MG tablet TAKE ONE TABLET BY MOUTH ONCE DAILY AS NEEDED 30 tablet 5   No current facility-administered medications on file prior to visit.      Past Medical History:  Diagnosis Date  . 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 Asthma    childhood  . Hypothyroidism   . IBS (irritable bowel syndrome)   . Multinodular goiter    s/p radioactive I131 ablation, Iatogenic Hypothyroidism  . Obesity   . Personal history of goiter    with radioactive iodine ablation    Allergies  Allergen Reactions  . Sulfonamide Derivatives Hives    Family History  Problem Relation Age of Onset  . Sarcoidosis Brother   . Kidney failure Father   . Hypertension Mother   . Diabetes Mother   . Heart disease Mother     Social History   Social History  . Marital status: Single    Spouse name: N/A  . Number of children: 0  . Years of education: 5   Occupational History  . 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 Smoking status: Never Smoker  . Smokeless tobacco: Never Used  . Alcohol use No  . Drug use: No  . Sexual activity: Not  Asked   Other Topics Concern  . None   Social History Narrative  . None     Vitals:   02/25/16 0908  BP: 124/80  Pulse: 71  Resp: 12   Body mass index is 44.48 kg/m.  O2 sat 95% at RA.  Wt Readings from Last 3 Encounters:  02/25/16 284 lb (128.8 kg)  11/20/15 280 lb (127 kg)  11/19/15 280 lb 4 oz (127.1 kg)     Physical Exam  Nursing note and vitals reviewed. Constitutional: She is oriented to person, place,  and time. She appears well-developed. No distress.  HENT:  Head: Atraumatic.  Right Ear: Hearing, tympanic membrane, external ear and ear canal normal.  Left Ear: Hearing, tympanic membrane, external ear and ear canal normal.  Mouth/Throat: Uvula is midline, oropharynx is clear and moist and mucous membranes are normal.  Eyes: Conjunctivae and EOM are normal. Pupils are equal, round, and reactive to light.  Neck: No tracheal deviation present. Thyromegaly (Hx of multinodular goiter) present. No thyroid mass present.  Cardiovascular: Normal rate and regular rhythm.   No murmur heard. Pulses:      Dorsalis pedis pulses are 2+ on the right side, and 2+ on the left side.  Varicose veins, LE bilateral.  Respiratory: Effort normal and breath sounds normal. No respiratory distress.  GI: Soft. She exhibits no mass. There is no hepatomegaly. There is no tenderness.  Genitourinary: No breast swelling or tenderness. There is no rash or lesion on the right labia. There is no rash or lesion on the left labia. Uterus is not enlarged and not tender. Cervix exhibits discharge. Cervix exhibits no motion tenderness and no friability. Right adnexum displays no tenderness and no fullness. Left adnexum displays no tenderness and no fullness. No erythema, tenderness or bleeding in the vagina. Vaginal discharge found.  Genitourinary Comments: Breast: No masses or nipple discharge bilateral. Bilateral fibrocystic changes, outer upper quadrants,bilateral. Manual pelvic examination was limited due to discomfort and body habits.  Musculoskeletal: She exhibits no edema or tenderness.  No major deformity or signs of synovitis appreciated.  Lymphadenopathy:    She has no cervical adenopathy.    She has no axillary adenopathy.       Right: No inguinal and no supraclavicular adenopathy present.       Left: No inguinal and no supraclavicular adenopathy present.  Neurological: She is alert and oriented to person, place, and  time. She has normal strength. No cranial nerve deficit. Coordination and gait normal.  Reflex Scores:      Bicep reflexes are 2+ on the right side and 2+ on the left side.      Patellar reflexes are 2+ on the right side and 2+ on the left side. Skin: Skin is warm. No rash noted. No erythema.  Psychiatric: She has a normal mood and affect. Her speech is normal.  Well groomed, good eye contact.      ASSESSMENT AND PLAN:    Jamaiya was seen today for annual exam and gynecologic exam.  Diagnoses and all orders for this visit:  Lab Results  Component Value Date   WBC 4.4 02/25/2016   HGB 11.7 (A) 11/20/2015   HCT 34.5 02/25/2016   MCV 82 02/25/2016   PLT 270 02/25/2016   Lab Results  Component Value Date   TSH 1.810 02/25/2016    Routine general medical examination at a health care facility  We discussed the importance of regular physical activity and healthy diet for prevention of  chronic illness and/or complications. Preventive guidelines reviewed. Vaccination up to date.Refused influenza vaccine.  Ca++ and vit D supplementation recommended. Next CPE in 1 year.   -     PAP [[place]] -     CBC (no diff)  Cervical cancer screening  -     PAP [[place]]  Vaginal discharge  Empiric treatment for BV started, vaginal Metronidazole. Avoid vaginal douches and baths.  F/U as needed.  -     metroNIDAZOLE (METROGEL) 0.75 % vaginal gel; Place 1 Applicatorful vaginally at bedtime.  Iatrogenic hypothyroidism  No changes in current management, will follow labs done today and will give further recommendations accordingly. F/U in 6 months.  -     TSH -     levothyroxine (SYNTHROID, LEVOTHROID) 125 MCG tablet; TAKE TWO TABLETS BY MOUTH DAILY BEFORE  BREAKFAST   Iron deficiency anemia, unspecified iron deficiency anemia type No changes in current management, will follow labs done today and will give further recommendations accordingly. Strongly recommended  re-scheduling colonoscopy.  -     CBC (no diff)  Dysfunctional uterine bleeding  We discussed possible causes, it could be related to perimenopausal state. Transvaginal u/s will be arranged.    Return in 6 months (on 08/24/2016) for Hypothyr,wt.Marland Kitchen.     Tashunda Vandezande G. [place], MD  [facility]. [place] office.

## 2016-02-25 NOTE — Progress Notes (Signed)
Pre visit review using our clinic review tool, if applicable. No additional management support is needed unless otherwise documented below in the visit note. 

## 2016-02-26 DIAGNOSIS — N926 Irregular menstruation, unspecified: Secondary | ICD-10-CM | POA: Diagnosis not present

## 2016-02-26 LAB — CBC
HEMATOCRIT: 34.5 % (ref 34.0–46.6)
Hemoglobin: 10.9 g/dL — ABNORMAL LOW (ref 11.1–15.9)
MCH: 26 pg — ABNORMAL LOW (ref 26.6–33.0)
MCHC: 31.6 g/dL (ref 31.5–35.7)
MCV: 82 fL (ref 79–97)
PLATELETS: 270 10*3/uL (ref 150–379)
RBC: 4.2 x10E6/uL (ref 3.77–5.28)
RDW: 14.7 % (ref 12.3–15.4)
WBC: 4.4 10*3/uL (ref 3.4–10.8)

## 2016-02-26 LAB — CYTOLOGY - PAP
DIAGNOSIS: NEGATIVE
HPV (WINDOPATH): NOT DETECTED

## 2016-02-26 LAB — TSH: TSH: 1.81 u[IU]/mL (ref 0.450–4.500)

## 2016-02-26 MED ORDER — LEVOTHYROXINE SODIUM 125 MCG PO TABS: ORAL_TABLET | ORAL | 2 refills | Status: DC

## 2016-02-27 ENCOUNTER — Other Ambulatory Visit: Payer: Self-pay

## 2016-02-27 DIAGNOSIS — N938 Other specified abnormal uterine and vaginal bleeding: Secondary | ICD-10-CM

## 2016-03-11 ENCOUNTER — Ambulatory Visit
Admission: RE | Admit: 2016-03-11 | Discharge: 2016-03-11 | Disposition: A | Discharge status: Home / Self Care | Payer: 59 | Source: Ambulatory Visit | Attending: Family Medicine | Admitting: Family Medicine

## 2016-03-11 DIAGNOSIS — N926 Irregular menstruation, unspecified: Secondary | ICD-10-CM | POA: Diagnosis not present

## 2016-03-11 DIAGNOSIS — N938 Other specified abnormal uterine and vaginal bleeding: Secondary | ICD-10-CM

## 2016-03-24 ENCOUNTER — Other Ambulatory Visit: Payer: Self-pay

## 2016-03-24 DIAGNOSIS — N938 Other specified abnormal uterine and vaginal bleeding: Secondary | ICD-10-CM

## 2016-04-16 ENCOUNTER — Encounter: Payer: 59 | Admitting: Obstetrics and Gynecology

## 2016-04-16 ENCOUNTER — Encounter: Payer: Self-pay | Admitting: Obstetrics and Gynecology

## 2016-04-16 NOTE — Progress Notes (Signed)
Patient did not keep GYN appointment for 04/16/2016.  Sahaana Weitman, Jr MD [facility]ending [facility] (Faculty Practice)   

## 2016-08-10 NOTE — Progress Notes (Signed)
HPI:   [name] is a 51 y.o. female, who is here today to follow on some chronic medical problems.  She was last seen on 02/25/16 for her routine CPE. Since her last OV she has followed with gyn, Dr [name].   Hypothyroidism:  Currently she is on Levothyroxine 125 mcg daily. Tolerating medication well, no side effects reported. She has not noted dysphagia, palpitations, abdominal pain, changes in bowel habits, tremor, cold/heat intolerance, or abnormal weight loss.  Lab Results  Component Value Date   TSH 1.810 02/25/2016   OA: Currently she is on Mobic 15 mg daily as needed. Lower extremities joint pain: hips, ankles, feet. Pain exacerbated by getting up after prolonged rest/sitting. + Stiffness. Alleviated by movement.  5/10 with me and w/o medication 7/10.  Obesity: She states that her clothes are fitting better.She is not weighing at home. Increased vegetables, stopped sodas, and increased water intake.Eating wheat pasta and sweet potatoes, both she thinks are healthier choices. She has not changed portion size significant. Exercising at home, cardio bike 3 times per week for 30 min, not briskly.   Concerns today: She had lab work recently through her employer and needs a form fill out for insurance purposes.  FLP (TC 157,HDL 50,LDL 82 ,TG 125), Cr 0.74, GLU 76,and HgA1C (5.3).   Hx of iron deficiency anemia, she has not had colonoscopy and planning on re-scheduling in 09/2016. Problem attributed to dysfunctional uterine bleeding, which has improved. 02/25/16 H/H 10.9/26.0   Review of Systems  Constitutional: Negative for activity change, appetite change, fatigue and fever.  HENT: Negative for mouth sores, nosebleeds and trouble swallowing.   Eyes: Negative for redness and visual disturbance.  Respiratory: Negative for cough, shortness of breath and wheezing.   Cardiovascular: Negative for chest pain, palpitations and leg swelling.    Gastrointestinal: Negative for abdominal pain, nausea and vomiting.       Negative for changes in bowel habits.  Genitourinary: Negative for decreased urine volume and hematuria.  Musculoskeletal: Positive for arthralgias. Negative for gait problem.  Neurological: Negative for syncope, weakness and headaches.  Hematological: Negative for adenopathy. Does not bruise/bleed easily.  Psychiatric/Behavioral: Negative for confusion. The patient is not nervous/anxious.      Current Outpatient Prescriptions on File Prior to Visit  Medication Sig Dispense Refill  . levothyroxine (SYNTHROID, LEVOTHROID) 125 MCG tablet TAKE TWO TABLETS BY MOUTH DAILY BEFORE  BREAKFAST 180 tablet 2   No current facility-administered medications on file prior to visit.      Past Medical History:  Diagnosis Date  . 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 Asthma    childhood  . Hypothyroidism   . IBS (irritable bowel syndrome)   . Multinodular goiter    s/p radioactive I131 ablation, Iatogenic Hypothyroidism  . Obesity   . Personal history of goiter    with radioactive iodine ablation   Allergies  Allergen Reactions  . Sulfonamide Derivatives Hives    Social History   Social History  . Marital status: Single    Spouse name: N/A  . Number of children: 0  . Years of education: 32   Occupational History  . 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 Smoking status: Never Smoker  . Smokeless tobacco: Never Used  . Alcohol use No  . Drug use: No  . Sexual activity: Not Asked   Other Topics Concern  . None  Social History Narrative  . None    Vitals:   08/11/16 1555  BP: 118/76  Pulse: 61  Resp: 12  O2 sat at RA 97% Body mass index is 44.97 kg/m.   Wt Readings from Last 3 Encounters:  08/11/16 287 lb 2 oz (130.2 kg)  02/25/16 284 lb (128.8 kg)  11/20/15 280 lb (127 kg)     Physical Exam  Nursing note and vitals  reviewed. Constitutional: She is oriented to person, place, and time. She appears well-developed. No distress.  HENT:  Head: Atraumatic.  Mouth/Throat: Oropharynx is clear and moist and mucous membranes are normal.  Eyes: Conjunctivae and EOM are normal.  Cardiovascular: Normal rate and regular rhythm.   No murmur heard. Respiratory: Effort normal and breath sounds normal. No respiratory distress.  GI: Soft. She exhibits no mass. There is no hepatomegaly. There is no tenderness.  Musculoskeletal: She exhibits edema (1+ pitting LE edema, bilateral.). She exhibits no tenderness.  No significant deformities or signs of synovitis appreciated.  Lymphadenopathy:    She has no cervical adenopathy.  Neurological: She is alert and oriented to person, place, and time. She has normal strength. Gait normal.  Skin: Skin is warm. No rash noted. No erythema.  Psychiatric: She has a normal mood and affect.  Well groomed, good eye contact.    ASSESSMENT AND PLAN:   [name] was seen today for follow-up.  Diagnoses and all orders for this visit:  Lab Results  Component Value Date   TSH 2.310 08/11/2016   Lab Results  Component Value Date   WBC 4.7 08/11/2016   HGB 11.3 08/11/2016   HCT 35.2 08/11/2016   MCV 86 08/11/2016   PLT 243 08/11/2016    Arthralgia of multiple sites, bilateral  Stable overall. No changes in current management, we discussed side effects of chronic NSAID's use. F/U in 12 months.  -     meloxicam (MOBIC) 15 MG tablet; TAKE ONE TABLET BY MOUTH ONCE DAILY AS NEEDED  Class 3 obesity with serious comorbidity and body mass index (BMI) of 40.0 to 44.9 in adult, unspecified obesity type (HCC)  Steadily gaining wt. Recommend weekly wt at home early in the morning, so she can track wt changes.  We discussed benefits of wt loss as well as adverse effects of obesity. Consistency with healthy diet and physical activity recommended. Portion control, nutritionist  counseling thought work program may be beneficial.  Labs reviewed and form filled out.  Iron deficiency anemia, unspecified iron deficiency anemia type  Further recommendations will be given according to lab results.  -     CBC (no diff)  Iatrogenic hypothyroidism  No changes in current management, will follow labs done today and will give further recommendations accordingly. F/U in 6-12 months.  -     TSH    -[name] was advised to return sooner than planned today if new concerns arise.       Valdez Brannan G. [place], MD  [facility]. [place] office.

## 2016-08-11 ENCOUNTER — Encounter: Payer: Self-pay | Admitting: Family Medicine

## 2016-08-11 ENCOUNTER — Ambulatory Visit (INDEPENDENT_AMBULATORY_CARE_PROVIDER_SITE_OTHER): Payer: 59 | Admitting: Family Medicine

## 2016-08-11 VITALS — BP 118/76 | HR 61 | Resp 12 | Ht 67.0 in | Wt 287.1 lb

## 2016-08-11 DIAGNOSIS — Z6841 Body Mass Index (BMI) 40.0 and over, adult: Secondary | ICD-10-CM | POA: Diagnosis not present

## 2016-08-11 DIAGNOSIS — M2550 Pain in unspecified joint: Secondary | ICD-10-CM | POA: Diagnosis not present

## 2016-08-11 DIAGNOSIS — E669 Obesity, unspecified: Secondary | ICD-10-CM

## 2016-08-11 DIAGNOSIS — IMO0001 Reserved for inherently not codable concepts without codable children: Secondary | ICD-10-CM

## 2016-08-11 DIAGNOSIS — E032 Hypothyroidism due to medicaments and other exogenous substances: Secondary | ICD-10-CM | POA: Diagnosis not present

## 2016-08-11 DIAGNOSIS — D509 Iron deficiency anemia, unspecified: Secondary | ICD-10-CM | POA: Diagnosis not present

## 2016-08-11 MED ORDER — MELOXICAM 15 MG PO TABS: ORAL_TABLET | ORAL | 5 refills | Status: DC

## 2016-08-11 NOTE — Patient Instructions (Addendum)
A few things to remember from today's visit:   Class 3 obesity with serious comorbidity and body mass index (BMI) of 40.0 to 44.9 in adult, unspecified obesity type (HCC)  Arthralgia of multiple sites, bilateral  Iron deficiency anemia, unspecified iron deficiency anemia type - Plan: CBC  Iatrogenic hypothyroidism - Plan: TSH   [name], today we have followed on some of your chronic medical problems and they seem to be stable, so no changes in current management today.  Review medication list and be sure it is accurate.  -Remember a healthy diet and regular physical activity are very important for prevention as well as for well being; they also help with many chronic problems, decreasing the need of adding new medications and delaying or preventing possible complications.  I will see you back in 11-12 months.  Remember to arrange your follow up appt before leaving today.  Please follow sooner than planned if a new concern arises.  DASH Eating Plan DASH stands for "Dietary Approaches to Stop Hypertension." The DASH eating plan is a healthy eating plan that has been shown to reduce high blood pressure (hypertension). It may also reduce your risk for type 2 diabetes, heart disease, and stroke. The DASH eating plan may also help with weight loss. What are tips for following this plan? General guidelines  Avoid eating more than 2,300 mg (milligrams) of salt (sodium) a day. If you have hypertension, you may need to reduce your sodium intake to 1,500 mg a day.  Limit alcohol intake to no more than 1 drink a day for nonpregnant women and 2 drinks a day for men. One drink equals 12 oz of beer, 5 oz of wine, or 1 oz of hard liquor.  Work with your health care provider to maintain a healthy body weight or to lose weight. Ask what an ideal weight is for you.  Get at least 30 minutes of exercise that causes your heart to beat faster (aerobic exercise) most days of the week.  Activities may include walking, swimming, or biking.  Work with your health care provider or diet and nutrition specialist (dietitian) to adjust your eating plan to your individual calorie needs. Reading food labels  Check food labels for the amount of sodium per serving. Choose foods with less than 5 percent of the Daily Value of sodium. Generally, foods with less than 300 mg of sodium per serving fit into this eating plan.  To find whole grains, look for the word "whole" as the first word in the ingredient list. Shopping  Buy products labeled as "low-sodium" or "no salt added."  Buy fresh foods. Avoid canned foods and premade or frozen meals. Cooking  Avoid adding salt when cooking. Use salt-free seasonings or herbs instead of table salt or sea salt. Check with your health care provider or pharmacist before using salt substitutes.  Do not fry foods. Cook foods using healthy methods such as baking, boiling, grilling, and broiling instead.  Cook with heart-healthy oils, such as olive, canola, soybean, or sunflower oil. Meal planning   Eat a balanced diet that includes: ? 5 or more servings of fruits and vegetables each day. At each meal, try to fill half of your plate with fruits and vegetables. ? Up to 6-8 servings of whole grains each day. ? Less than 6 oz of lean meat, poultry, or fish each day. A 3-oz serving of meat is about the same size as a deck of cards. One egg equals 1 oz. ?  2 servings of low-fat dairy each day. ? A serving of nuts, seeds, or beans 5 times each week. ? Heart-healthy fats. Healthy fats called Omega-3 fatty acids are found in foods such as flaxseeds and coldwater fish, like sardines, salmon, and mackerel.  Limit how much you eat of the following: ? Canned or prepackaged foods. ? Food that is high in trans fat, such as fried foods. ? Food that is high in saturated fat, such as fatty meat. ? Sweets, desserts, sugary drinks, and other foods with added  sugar. ? Full-fat dairy products.  Do not salt foods before eating.  Try to eat at least 2 vegetarian meals each week.  Eat more home-cooked food and less restaurant, buffet, and fast food.  When eating at a restaurant, ask that your food be prepared with less salt or no salt, if possible. What foods are recommended? The items listed may not be a complete list. Talk with your dietitian about what dietary choices are best for you. Grains Whole-grain or whole-wheat bread. Whole-grain or whole-wheat pasta. Brown rice. [name]. Bulgur. Whole-grain and low-sodium cereals. Pita bread. Low-fat, low-sodium crackers. Whole-wheat flour tortillas. Vegetables Fresh or frozen vegetables (raw, steamed, roasted, or grilled). Low-sodium or reduced-sodium tomato and vegetable juice. Low-sodium or reduced-sodium tomato sauce and tomato paste. Low-sodium or reduced-sodium canned vegetables. Fruits All fresh, dried, or frozen fruit. Canned fruit in natural juice (without added sugar). Meat and other protein foods Skinless chicken or [place]. Ground chicken or [place]. Pork with fat trimmed off. Fish and seafood. Egg whites. Dried beans, peas, or lentils. Unsalted nuts, nut butters, and seeds. Unsalted canned beans. Lean cuts of beef with fat trimmed off. Low-sodium, lean deli meat. Dairy Low-fat (1%) or fat-free (skim) milk. Fat-free, low-fat, or reduced-fat cheeses. Nonfat, low-sodium ricotta or cottage cheese. Low-fat or nonfat yogurt. Low-fat, low-sodium cheese. Fats and oils Soft margarine without trans fats. Vegetable oil. Low-fat, reduced-fat, or light mayonnaise and salad dressings (reduced-sodium). Canola, safflower, olive, soybean, and sunflower oils. Avocado. Seasoning and other foods Herbs. Spices. Seasoning mixes without salt. Unsalted popcorn and pretzels. Fat-free sweets. What foods are not recommended? The items listed may not be a complete list. Talk with your dietitian about what  dietary choices are best for you. Grains Baked goods made with fat, such as croissants, muffins, or some breads. Dry pasta or rice meal packs. Vegetables Creamed or fried vegetables. Vegetables in a cheese sauce. Regular canned vegetables (not low-sodium or reduced-sodium). Regular canned tomato sauce and paste (not low-sodium or reduced-sodium). Regular tomato and vegetable juice (not low-sodium or reduced-sodium). [name]. Olives. Fruits Canned fruit in a light or heavy syrup. Fried fruit. Fruit in cream or butter sauce. Meat and other protein foods Fatty cuts of meat. Ribs. Fried meat. [name]. Sausage. Bologna and other processed lunch meats. Salami. Fatback. Hotdogs. Bratwurst. Salted nuts and seeds. Canned beans with added salt. Canned or smoked fish. Whole eggs or egg yolks. Chicken or [place] with skin. Dairy Whole or 2% milk, cream, and half-and-half. Whole or full-fat cream cheese. Whole-fat or sweetened yogurt. Full-fat cheese. Nondairy creamers. Whipped toppings. Processed cheese and cheese spreads. Fats and oils Butter. Stick margarine. Lard. Shortening. Ghee. Bacon fat. Tropical oils, such as coconut, palm kernel, or palm oil. Seasoning and other foods Salted popcorn and pretzels. Onion salt, garlic salt, seasoned salt, table salt, and sea salt. Worcestershire sauce. Tartar sauce. Barbecue sauce. Teriyaki sauce. Soy sauce, including reduced-sodium. Steak sauce. Canned and packaged gravies. Fish sauce. Oyster sauce. Cocktail sauce.  Horseradish that you find on the shelf. Ketchup. Mustard. Meat flavorings and tenderizers. Bouillon cubes. Hot sauce and Tabasco sauce. Premade or packaged marinades. Premade or packaged taco seasonings. Relishes. Regular salad dressings. Where to find more information:  National Heart, Lung, and Blood Institute: PopSteam.iswww.nhlbi.nih.gov  American Heart Association: www.heart.org Summary  The DASH eating plan is a healthy eating plan that has been shown to reduce  high blood pressure (hypertension). It may also reduce your risk for type 2 diabetes, heart disease, and stroke.  With the DASH eating plan, you should limit salt (sodium) intake to 2,300 mg a day. If you have hypertension, you may need to reduce your sodium intake to 1,500 mg a day.  When on the DASH eating plan, aim to eat more fresh fruits and vegetables, whole grains, lean proteins, low-fat dairy, and heart-healthy fats.  Work with your health care provider or diet and nutrition specialist (dietitian) to adjust your eating plan to your individual calorie needs. This information is not intended to replace advice given to you by your health care provider. Make sure you discuss any questions you have with your health care provider. Document Released: 01/08/2011 Document Revised: 01/13/2016 Document Reviewed: 01/13/2016 Elsevier Interactive Patient Education  2017 [employer]  Please be sure medication list is accurate. If a new problem present, please set up appointment sooner than planned today.

## 2016-08-13 LAB — CBC
HEMOGLOBIN: 11.3 g/dL (ref 11.1–15.9)
Hematocrit: 35.2 % (ref 34.0–46.6)
MCH: 27.6 pg (ref 26.6–33.0)
MCHC: 32.1 g/dL (ref 31.5–35.7)
MCV: 86 fL (ref 79–97)
Platelets: 243 10*3/uL (ref 150–379)
RBC: 4.1 x10E6/uL (ref 3.77–5.28)
RDW: 14.6 % (ref 12.3–15.4)
WBC: 4.7 10*3/uL (ref 3.4–10.8)

## 2016-08-13 LAB — TSH: TSH: 2.31 u[IU]/mL (ref 0.450–4.500)

## 2016-08-16 ENCOUNTER — Encounter: Payer: Self-pay | Admitting: Family Medicine

## 2016-08-19 ENCOUNTER — Telehealth: Payer: Self-pay

## 2016-08-19 NOTE — Telephone Encounter (Signed)
Left voicemail letting patient know that appeal form is completed & up front ready for pick up.

## 2016-08-26 ENCOUNTER — Ambulatory Visit: Payer: 59 | Admitting: Family Medicine

## 2016-10-22 ENCOUNTER — Encounter (HOSPITAL_COMMUNITY): Payer: Self-pay

## 2016-10-22 ENCOUNTER — Ambulatory Visit (HOSPITAL_COMMUNITY): Admit: 2016-10-22 | Payer: 59 | Admitting: Gastroenterology

## 2016-10-22 SURGERY — UPPER ENDOSCOPIC ULTRASOUND (EUS) LINEAR
Anesthesia: Monitor Anesthesia Care

## 2017-02-11 ENCOUNTER — Other Ambulatory Visit: Payer: Self-pay

## 2017-02-11 ENCOUNTER — Ambulatory Visit (AMBULATORY_SURGERY_CENTER): Payer: Self-pay | Admitting: *Deleted

## 2017-02-11 VITALS — Ht 67.0 in | Wt 283.0 lb

## 2017-02-11 DIAGNOSIS — Z800 Family history of malignant neoplasm of digestive organs: Secondary | ICD-10-CM

## 2017-02-11 MED ORDER — PEG 3350-KCL-NA BICARB-NACL 420 G PO SOLR: 4000.0000 mL | Freq: Once | ORAL | 0 refills | Status: AC

## 2017-02-11 NOTE — Progress Notes (Signed)
No egg or soy allergy known to patient  No issues with past sedation with any surgeries  or procedures, no intubation problems  No diet pills per patient No home 02 use per patient  No blood thinners per patient  Pt denies issues with constipation  No A fib or A flutter  EMMI video sent to pt's e mail  

## 2017-02-16 ENCOUNTER — Encounter: Payer: Self-pay | Admitting: Gastroenterology

## 2017-02-23 ENCOUNTER — Encounter: Payer: Self-pay | Admitting: Gastroenterology

## 2017-02-23 ENCOUNTER — Ambulatory Visit (AMBULATORY_SURGERY_CENTER): Payer: 59 | Admitting: Gastroenterology

## 2017-02-23 ENCOUNTER — Other Ambulatory Visit: Payer: Self-pay

## 2017-02-23 VITALS — BP 163/97 | HR 71 | Temp 97.7°F | Resp 13 | Ht 67.0 in | Wt 283.0 lb

## 2017-02-23 DIAGNOSIS — Z800 Family history of malignant neoplasm of digestive organs: Secondary | ICD-10-CM

## 2017-02-23 DIAGNOSIS — Z1211 Encounter for screening for malignant neoplasm of colon: Secondary | ICD-10-CM | POA: Diagnosis not present

## 2017-02-23 DIAGNOSIS — Z1212 Encounter for screening for malignant neoplasm of rectum: Secondary | ICD-10-CM

## 2017-02-23 MED ORDER — SODIUM CHLORIDE 0.9 % IV SOLN: 500.0000 mL | Freq: Once | INTRAVENOUS | Status: DC

## 2017-02-23 NOTE — Op Note (Signed)
[facility] Patient Name: [name] Procedure Date: 02/23/2017 1:44 PM MRN: [id number] Endoscopist: Rachael Fee , MD Age: 52 Referring MD:  Date of Birth: 04-09-1965 Gender: Female Account #: [id number] Procedure:                Colonoscopy Indications:              Screening in patient at increased risk: Family                            history of 1st-degree relative with colorectal                            cancer before age 64 years (brother had metastatic                            cancer, probable primary was colon per patient) Medicines:                Monitored Anesthesia Care Procedure:                Pre-Anesthesia Assessment:                           - Prior to the procedure, a History and Physical                            was performed, and patient medications and                            allergies were reviewed. The patient's tolerance of                            previous anesthesia was also reviewed. The risks                            and benefits of the procedure and the sedation                            options and risks were discussed with the patient.                            All questions were answered, and informed consent                            was obtained. Prior Anticoagulants: The patient has                            taken no previous anticoagulant or antiplatelet                            agents. ASA Grade Assessment: II - A patient with                            mild systemic disease. After reviewing the risks  and benefits, the patient was deemed in                            satisfactory condition to undergo the procedure.                           After obtaining informed consent, the colonoscope                            was passed under direct vision. Throughout the                            procedure, the patient's blood pressure, pulse, and                            oxygen  saturations were monitored continuously. The                            Colonoscope was introduced through the anus and                            advanced to the the cecum, identified by                            appendiceal orifice and ileocecal valve. The                            colonoscopy was performed without difficulty. The                            patient tolerated the procedure well. The quality                            of the bowel preparation was good. The ileocecal                            valve, appendiceal orifice, and rectum were                            photographed. Scope In: 1:49:29 PM Scope Out: 2:03:27 PM Scope Withdrawal Time: 0 hours 11 minutes 0 seconds  Total Procedure Duration: 0 hours 13 minutes 58 seconds  Findings:                 The entire examined colon appeared normal on direct                            and retroflexion views. Complications:            No immediate complications. Estimated blood loss:                            None. Estimated Blood Loss:     Estimated blood loss: none. Impression:               - The entire examined colon is normal  on direct and                            retroflexion views.                           - No polyps or cancers. Recommendation:           - Patient has a contact number available for                            emergencies. The signs and symptoms of potential                            delayed complications were discussed with the                            patient. Return to normal activities tomorrow.                            Written discharge instructions were provided to the                            patient.                           - Resume previous diet.                           - Continue present medications.                           - Repeat colonoscopy in 5 years for screening. Rachael Fee, MD 02/23/2017 2:06:03 PM This report has been signed electronically.

## 2017-02-23 NOTE — Progress Notes (Signed)
A/ox3 pleased with MAC, report to Meg RN 

## 2017-02-23 NOTE — Progress Notes (Signed)
Pt's states no medical or surgical changes since previsit or office visit. 

## 2017-02-23 NOTE — Patient Instructions (Signed)
YOU HAD AN ENDOSCOPIC PROCEDURE TODAY AT THE [facility]:   Refer to the procedure report that was given to you for any specific questions about what was found during the examination.  If the procedure report does not answer your questions, please call your gastroenterologist to clarify.  If you requested that your care partner not be given the details of your procedure findings, then the procedure report has been included in a sealed envelope for you to review at your convenience later.  YOU SHOULD EXPECT: Some feelings of bloating in the abdomen. Passage of more gas than usual.  Walking can help get rid of the air that was put into your GI tract during the procedure and reduce the bloating. If you had a lower endoscopy (such as a colonoscopy or flexible sigmoidoscopy) you may notice spotting of blood in your stool or on the toilet paper. If you underwent a bowel prep for your procedure, you may not have a normal bowel movement for a few days.  Please Note:  You might notice some irritation and congestion in your nose or some drainage.  This is from the oxygen used during your procedure.  There is no need for concern and it should clear up in a day or so.  SYMPTOMS TO REPORT IMMEDIATELY:   Following lower endoscopy (colonoscopy or flexible sigmoidoscopy):  Excessive amounts of blood in the stool  Significant tenderness or worsening of abdominal pains  Swelling of the abdomen that is new, acute  Fever of 100F or higher  For urgent or emergent issues, a gastroenterologist can be reached at any hour by calling [phone].   DIET:  We do recommend a small meal at first, but then you may proceed to your regular diet.  Drink plenty of fluids but you should avoid alcoholic beverages for 24 hours.  ACTIVITY:  You should plan to take it easy for the rest of today and you should NOT DRIVE or use heavy machinery until tomorrow (because of the sedation medicines used during the test).     FOLLOW UP: Our staff will call the number listed on your records the next business day following your procedure to check on you and address any questions or concerns that you may have regarding the information given to you following your procedure. If we do not reach you, we will leave a message.  However, if you are feeling well and you are not experiencing any problems, there is no need to return our call.  We will assume that you have returned to your regular daily activities without incident.  If any biopsies were taken you will be contacted by phone or by letter within the next 1-3 weeks.  Please call us at [phone] if you have not heard about the biopsies in 3 weeks.    SIGNATURES/CONFIDENTIALITY: You and/or your care partner have signed paperwork which will be entered into your electronic medical record.  These signatures attest to the fact that that the information above on your After Visit Summary has been reviewed and is understood.  Full responsibility of the confidentiality of this discharge information lies with you and/or your care-partner. 

## 2017-02-24 ENCOUNTER — Telehealth: Payer: Self-pay

## 2017-02-24 NOTE — Telephone Encounter (Signed)
  Follow up Call-  Call back number 02/23/2017  Post procedure Call Back phone  # [phone]  Permission to leave phone message Yes  Some recent data might be hidden     Patient questions:  Do you have a fever, pain , or abdominal swelling? No. Pain Score  0 *  Have you tolerated food without any problems? Yes.    Have you been able to return to your normal activities? Yes.    Do you have any questions about your discharge instructions: Diet   No. Medications  No. Follow up visit  No.  Do you have questions or concerns about your Care? No.  Actions: * If pain score is 4 or above: No action needed, pain <4.

## 2017-02-27 ENCOUNTER — Other Ambulatory Visit: Payer: Self-pay | Admitting: Family Medicine

## 2017-02-27 DIAGNOSIS — E032 Hypothyroidism due to medicaments and other exogenous substances: Secondary | ICD-10-CM

## 2017-05-11 NOTE — Progress Notes (Signed)
HPI:   [name] is a 52 y.o. female, who is here today for follow up.   She was last seen on 08/11/16   Hypothyroidism:  Multinodular goiter. S/P radioactive I131 ablation.  Currently Levothyroxine 250 mcg daily. Tolerating medication well, no side effects reported. She has not noted dysphagia, palpitations, abdominal pain, changes in bowel habits, tremor, cold/heat intolerance, or abnormal weight loss.  Lab Results  Component Value Date   TSH 2.310 08/11/2016    Generalized OA: Hips,ankles,and feet. Pain is exacerbated by prolonged walking,standing,and walking stairs.  Currently she is on Mobic 15 mg daily prn, in average she takes I q 2 days..  + Stiffness. Problem is stable.  She is exercising regularly at home and trying to follow a healthy diet. She has noted that her clothes fit better but has not noted wt loss (scale).    Review of Systems  Constitutional: Negative for chills and fever.  Respiratory: Negative for cough, shortness of breath and wheezing.   Cardiovascular: Negative for chest pain, palpitations and leg swelling.  Gastrointestinal: Negative for abdominal pain, nausea and vomiting.       No changes in bowel habits.  Endocrine: Negative for cold intolerance and heat intolerance.  Genitourinary: Negative for decreased urine volume, dysuria and hematuria.  Musculoskeletal: Positive for arthralgias. Negative for joint swelling.  Skin: Negative for rash.  Neurological: Negative for weakness and headaches.     Current Outpatient Medications on File Prior to Visit  Medication Sig Dispense Refill  . levothyroxine (SYNTHROID, LEVOTHROID) 125 MCG tablet TAKE TWO TABLETS BY MOUTH ONCE DAILY BEFORE BREAKFAST 180 tablet 1   Current Facility-Administered Medications on File Prior to Visit  Medication Dose Route Frequency Provider Last Rate Last Dose  . 0.9 %  sodium chloride infusion  500 mL Intravenous Once Rachael Fee, MD          Past Medical History:  Diagnosis Date  . 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 Anemia    past hx   . Asthma    childhood  . Hypothyroidism   . IBS (irritable bowel syndrome)   . Multinodular goiter    s/p radioactive I131 ablation, Iatogenic Hypothyroidism  . Obesity   . Personal history of goiter    with radioactive iodine ablation   Allergies  Allergen Reactions  . Sulfonamide Derivatives Hives    Social History   Socioeconomic History  . Marital status: Single    Spouse name: Not on file  . Number of children: 0  . Years of education: 59  . Highest education level: Not on file  Occupational History  . Occupation: CLINICAL CHEMIST    Employer: [employer]  Social Needs  . Financial resource strain: Not on file  . Food insecurity:    Worry: Not on file    Inability: Not on file  . Transportation needs:    Medical: Not on file    Non-medical: Not on file  Tobacco Use  . Smoking status: Never Smoker  . Smokeless tobacco: Never Used  Substance and Sexual Activity  . Alcohol use: No  . Drug use: No  . Sexual activity: Not on file  Lifestyle  . Physical activity:    Days per week: Not on file    Minutes per session: Not on file  . Stress: Not on file  Relationships  . Social connections:    Talks on phone: Not  on file    Gets together: Not on file    Attends religious service: Not on file    Active member of club or organization: Not on file    Attends meetings of clubs or organizations: Not on file    Relationship status: Not on file  Other Topics Concern  . Not on file  Social History Narrative  . Not on file    Vitals:   05/12/17 0904  BP: 126/82  Pulse: 82  Resp: 12  Temp: 98.2 F (36.8 C)  SpO2: 99%   Body mass index is 44.85 kg/m.   Wt Readings from Last 3 Encounters:  05/12/17 286 lb 6 oz (129.9 kg)  02/23/17 283 lb (128.4 kg)  02/11/17 283 lb (128.4 kg)     Physical Exam   Nursing note and vitals reviewed. Constitutional: She is oriented to person, place, and time. She appears well-developed. No distress.  HENT:  Head: Normocephalic and atraumatic.  Mouth/Throat: Oropharynx is clear and moist and mucous membranes are normal.  Eyes: Pupils are equal, round, and reactive to light. Conjunctivae are normal.  Neck: No tracheal deviation present. Thyromegaly present.  Cardiovascular: Normal rate and regular rhythm.  No murmur heard. Pulses:      Dorsalis pedis pulses are 2+ on the right side, and 2+ on the left side.  Respiratory: Effort normal and breath sounds normal. No respiratory distress.  GI: Soft. She exhibits no mass. There is no hepatomegaly. There is no tenderness.  Musculoskeletal: She exhibits no edema.       Left hip: She exhibits normal strength and no tenderness.  Left hip pain with no significant limitation of ROM  Lymphadenopathy:    She has no cervical adenopathy.  Neurological: She is alert and oriented to person, place, and time. She has normal strength. Coordination normal.  Antalgic gait.  Skin: Skin is warm. No erythema.  Psychiatric: She has a normal mood and affect.  Well groomed, good eye contact.      ASSESSMENT AND PLAN:   [name] was seen today for follow-up.  Orders Placed This Encounter  Procedures  . Basic metabolic panel  . TSH   Lab Results  Component Value Date   TSH 8.000 (H) 05/12/2017   Lab Results  Component Value Date   CREATININE 0.66 05/12/2017   BUN 9 05/12/2017   NA 142 05/12/2017   K 4.0 05/12/2017   CL 103 05/12/2017   CO2 25 05/12/2017    Arthralgia of multiple sites, bilateral No changes in current management. We discussed some side effects of Mobic, including CV. Recommend adding OTC Tylenol arthritis 3 times per day as needed. Continue working on weight loss.   Iatrogenic hypothyroidism No changes in current management, will follow labs done today and will give further  recommendations accordingly. Follow-up in 6-12 months.  Obesity, Class III, BMI 40-49.9 (morbid obesity) (HCC) Weight overall stable since her last visit in 08/2016.  She is reporting inches loss, clothes in general feel loose. We discussed benefits of wt loss as well as adverse effects of obesity. Consistency with healthy diet and physical activity recommended.      -Ms. Lorraine HandyChristina Mcguiness was advised to return sooner than planned today if new concerns arise.       Betty G. SwazilandJordan, MD  [facility]. [place] office.

## 2017-05-12 ENCOUNTER — Ambulatory Visit (INDEPENDENT_AMBULATORY_CARE_PROVIDER_SITE_OTHER): Payer: 59 | Admitting: Family Medicine

## 2017-05-12 ENCOUNTER — Encounter: Payer: Self-pay | Admitting: Family Medicine

## 2017-05-12 VITALS — BP 126/82 | HR 82 | Temp 98.2°F | Resp 12 | Ht 67.0 in | Wt 286.4 lb

## 2017-05-12 DIAGNOSIS — M2550 Pain in unspecified joint: Secondary | ICD-10-CM

## 2017-05-12 DIAGNOSIS — E032 Hypothyroidism due to medicaments and other exogenous substances: Secondary | ICD-10-CM

## 2017-05-12 LAB — BASIC METABOLIC PANEL: GLUCOSE: 62

## 2017-05-12 MED ORDER — MELOXICAM 15 MG PO TABS: ORAL_TABLET | ORAL | 3 refills | Status: DC

## 2017-05-12 NOTE — Assessment & Plan Note (Signed)
No changes in current management. We discussed some side effects of Mobic, including CV. Recommend adding OTC Tylenol arthritis 3 times per day as needed. Continue working on weight loss.

## 2017-05-12 NOTE — Assessment & Plan Note (Signed)
Weight overall stable since her last visit in 08/2016.  She is reporting inches loss, clothes in general feel loose. We discussed benefits of wt loss as well as adverse effects of obesity. Consistency with healthy diet and physical activity recommended.

## 2017-05-12 NOTE — Patient Instructions (Signed)
A few things to remember from today's visit:   Iatrogenic hypothyroidism - Plan: TSH, Basic metabolic panel  Arthralgia of multiple sites, bilateral - Plan: meloxicam (MOBIC) 15 MG tablet, Basic metabolic panel  You can combine Mobic with Tylenol arthritis over-the-counter. Weight loss might also help with the hip and the leg pain. Continue following with a podiatrist for the plantar fasciitis.  If labs are fine today I think we can continue following annually.  Please be sure medication list is accurate. If a new problem present, please set up appointment sooner than planned today.

## 2017-05-12 NOTE — Assessment & Plan Note (Signed)
No changes in current management, will follow labs done today and will give further recommendations accordingly. Follow-up in 6-12 months. 

## 2017-05-13 LAB — BASIC METABOLIC PANEL
BUN / CREAT RATIO: 14 (ref 9–23)
BUN: 9 mg/dL (ref 6–24)
CHLORIDE: 103 mmol/L (ref 96–106)
CO2: 25 mmol/L (ref 20–29)
CREATININE: 0.66 mg/dL (ref 0.57–1.00)
Calcium: 9 mg/dL (ref 8.7–10.2)
GFR, EST AFRICAN AMERICAN: 119 mL/min/{1.73_m2} (ref >59)
GFR, EST NON AFRICAN AMERICAN: 103 mL/min/{1.73_m2} (ref >59)
Glucose: 62 mg/dL — ABNORMAL LOW (ref 65–99)
Potassium: 4 mmol/L (ref 3.5–5.2)
Sodium: 142 mmol/L (ref 134–144)

## 2017-05-13 LAB — TSH: TSH: 8 u[IU]/mL — ABNORMAL HIGH (ref 0.450–4.500)

## 2017-05-17 ENCOUNTER — Telehealth: Payer: Self-pay | Admitting: Family Medicine

## 2017-05-17 NOTE — Telephone Encounter (Signed)
Copied from CRM [id number]. Topic: Quick Communication - Lab Results >> May 17, 2017 11:31 AM Jobe GibbonJones, Quaneisha S, CMA wrote: Called patient to inform them of their lab results. When patient returns call, triage nurse may disclose results.

## 2017-05-17 NOTE — Telephone Encounter (Signed)
Results given and documented in result note. 

## 2017-05-18 ENCOUNTER — Other Ambulatory Visit: Payer: Self-pay | Admitting: *Deleted

## 2017-05-18 MED ORDER — LEVOTHYROXINE SODIUM 137 MCG PO TABS: 137.0000 ug | ORAL_TABLET | Freq: Two times a day (BID) | ORAL | 2 refills | Status: DC

## 2017-05-18 MED ORDER — LEVOTHYROXINE SODIUM 137 MCG PO TABS: 137.0000 ug | ORAL_TABLET | Freq: Every day | ORAL | 2 refills | Status: DC

## 2017-05-28 ENCOUNTER — Other Ambulatory Visit: Payer: Self-pay | Admitting: Family Medicine

## 2017-05-28 ENCOUNTER — Encounter: Payer: Self-pay | Admitting: Family Medicine

## 2017-05-28 DIAGNOSIS — Z1231 Encounter for screening mammogram for malignant neoplasm of breast: Secondary | ICD-10-CM

## 2017-06-03 ENCOUNTER — Ambulatory Visit: Payer: Self-pay | Admitting: *Deleted

## 2017-06-03 NOTE — Telephone Encounter (Signed)
Pt  Called stating she has been having pain in arms and shoulders since 05/27/2017   Pt was  Taking levothyroxine  125 mcg 2 tabs daily which was  increased by Dr Swaziland  On 05/16/2017 to  Levothyroxine 137 mcg 2 tabs daily . And the RX was  Sent to [place] [address] pt  Was informed on 05/17/2017.  Patient stated she has only been taking Levothyroxine 137 mcg 1 tab daily - Pt advised to take the correct current dose as directed and  labeled on new prescription bottle.

## 2017-06-04 NOTE — Telephone Encounter (Signed)
Message sent to Dr. Jordan for review. 

## 2017-06-08 NOTE — Telephone Encounter (Signed)
I do not think levothyroxine is causing arms and shoulder pain. Levothyroxine was adjusted because TSH was still elevated (hypo). She is supposed to have TSH repeated 6 weeks after med dose was adjusted.  If TSH is still not at goal we may need to refer her to endocrinologist. Please advised to follow if arm pain continues.  Thanks, BJ

## 2017-06-08 NOTE — Telephone Encounter (Signed)
Spoke with patient and she stated that the pain had went away and she is doing fine. Patient has a lab appointment scheduled for 06/2817 to repeat TSH.

## 2017-06-17 ENCOUNTER — Ambulatory Visit
Admission: RE | Admit: 2017-06-17 | Discharge: 2017-06-17 | Disposition: A | Discharge status: Home / Self Care | Payer: 59 | Source: Ambulatory Visit | Attending: Family Medicine | Admitting: Family Medicine

## 2017-06-17 DIAGNOSIS — Z1231 Encounter for screening mammogram for malignant neoplasm of breast: Secondary | ICD-10-CM

## 2017-06-29 ENCOUNTER — Ambulatory Visit (INDEPENDENT_AMBULATORY_CARE_PROVIDER_SITE_OTHER): Payer: 59 | Admitting: Family Medicine

## 2017-06-29 ENCOUNTER — Encounter: Payer: Self-pay | Admitting: Family Medicine

## 2017-06-29 ENCOUNTER — Other Ambulatory Visit: Payer: 59

## 2017-06-29 VITALS — BP 122/84 | HR 100 | Temp 98.3°F | Resp 12 | Ht 67.0 in | Wt 291.5 lb

## 2017-06-29 DIAGNOSIS — N898 Other specified noninflammatory disorders of vagina: Secondary | ICD-10-CM | POA: Diagnosis not present

## 2017-06-29 DIAGNOSIS — E032 Hypothyroidism due to medicaments and other exogenous substances: Secondary | ICD-10-CM

## 2017-06-29 MED ORDER — METRONIDAZOLE 0.75 % VA GEL: 1.0000 | Freq: Every day | VAGINAL | 0 refills | Status: AC

## 2017-06-29 NOTE — Patient Instructions (Addendum)
A few things to remember from today's visit:   No diagnosis found.   Bacterial Vaginosis Bacterial vaginosis is an infection of the vagina. It happens when too many germs (bacteria) grow in the vagina. This infection puts you at risk for infections from sex (STIs). Treating this infection can lower your risk for some STIs. You should also treat this if you are pregnant. It can cause your baby to be born early. Follow these instructions at home: Medicines  Take over-the-counter and prescription medicines only as told by your doctor.  Take or use your antibiotic medicine as told by your doctor. Do not stop taking or using it even if you start to feel better. General instructions  If you your sexual partner is a woman, tell her that you have this infection. She needs to get treatment if she has symptoms. If you have a female partner, he does not need to be treated.  During treatment: ? Avoid sex. ? Do not douche. ? Avoid alcohol as told. ? Avoid breastfeeding as told.  Drink enough fluid to keep your pee (urine) clear or pale yellow.  Keep your vagina and butt (rectum) clean. ? Wash the area with warm water every day. ? Wipe from front to back after you use the toilet.  Keep all follow-up visits as told by your doctor. This is important. Preventing this condition  Do not douche.  Use only warm water to wash around your vagina.  Use protection when you have sex. This includes: ? Latex condoms. ? Dental dams.  Limit how many people you have sex with. It is best to only have sex with the same person (be monogamous).  Get tested for STIs. Have your partner get tested.  Wear underwear that is cotton or lined with cotton.  Avoid tight pants and pantyhose. This is most important in summer.  Do not use any products that have nicotine or tobacco in them. These include cigarettes and e-cigarettes. If you need help quitting, ask your doctor.  Do not use illegal drugs.  Limit how  much alcohol you drink. Contact a doctor if:  Your symptoms do not get better, even after you are treated.  You have more discharge or pain when you pee (urinate).  You have a fever.  You have pain in your belly (abdomen).  You have pain with sex.  Your bleed from your vagina between periods. Summary  This infection happens when too many germs (bacteria) grow in the vagina.  Treating this condition can lower your risk for some infections from sex (STIs).  You should also treat this if you are pregnant. It can cause early (premature) birth.  Do not stop taking or using your antibiotic medicine even if you start to feel better. This information is not intended to replace advice given to you by your health care provider. Make sure you discuss any questions you have with your health care provider. Document Released: 10/29/2007 Document Revised: 10/05/2015 Document Reviewed: 10/05/2015 Elsevier Interactive Patient Education  2017 [employer]  Please be sure medication list is accurate. If a new problem present, please set up appointment sooner than planned today.

## 2017-06-29 NOTE — Progress Notes (Signed)
ACUTE VISIT   HPI:  Chief Complaint  Patient presents with  . Vaginal Discharge    brown/watery, started 1 week ago after menstrual ended, no odor or pain    [name] is a 52 y.o. female, who is here today complaining of around 7 days of vaginal discharge.  Initially discharge was "watery" and then brownish for 4 to 5 days. She still has some vaginal discharge, it is lighter now and has progressively improved. She has noticed factors for STDs. No history of sex intercourse/never sexually active.  She is established with gyn, hx of DUB, which has resolved.  No prior Hx of similar discharge.  No fever,chills,abdominal pain,nausea,vomiting,or rash.    Initially vaginal discharge was odorous. She does not have associated pruritus.  She changed soaps thinking it was related.  She has not tried OTC medication. She increased cranberry juice intake in case it was a UTI.   Denies dysuria,increased urinary frequency, gross hematuria,or decreased urine output.  Review of Systems  Constitutional: Negative for activity change, appetite change, fatigue and fever.  Gastrointestinal: Negative for abdominal pain, nausea and vomiting.       No changes in bowel habits.  Genitourinary: Positive for vaginal discharge. Negative for decreased urine volume, dysuria, frequency, hematuria, pelvic pain, urgency and vaginal bleeding.  Musculoskeletal: Negative for back pain and myalgias.  Skin: Negative for rash and wound.  Neurological: Negative for weakness and headaches.      Current Outpatient Medications on File Prior to Visit  Medication Sig Dispense Refill  . levothyroxine (SYNTHROID, LEVOTHROID) 137 MCG tablet Take 1 tablet (137 mcg total) by mouth 2 (two) times daily. 90 tablet 2  . meloxicam (MOBIC) 15 MG tablet TAKE ONE TABLET BY MOUTH ONCE DAILY AS NEEDED 30 tablet 3   Current Facility-Administered Medications on File Prior to Visit  Medication Dose Route  Frequency Provider Last Rate Last Dose  . 0.9 %  sodium chloride infusion  500 mL Intravenous Once Rachael Fee, MD         Past Medical History:  Diagnosis Date  . 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 Anemia    past hx   . Asthma    childhood  . Hypothyroidism   . IBS (irritable bowel syndrome)   . Multinodular goiter    s/p radioactive I131 ablation, Iatogenic Hypothyroidism  . Obesity   . Personal history of goiter    with radioactive iodine ablation   Allergies  Allergen Reactions  . Sulfonamide Derivatives Hives    Social History   Socioeconomic History  . Marital status: Single    Spouse name: Not on file  . Number of children: 0  . Years of education: 48  . Highest education level: Not on file  Occupational History  . Occupation: CLINICAL CHEMIST    Employer: [employer]  Social Needs  . Financial resource strain: Not on file  . Food insecurity:    Worry: Not on file    Inability: Not on file  . Transportation needs:    Medical: Not on file    Non-medical: Not on file  Tobacco Use  . Smoking status: Never Smoker  . Smokeless tobacco: Never Used  Substance and Sexual Activity  . Alcohol use: No  . Drug use: No  . Sexual activity: Not on file  Lifestyle  . Physical activity:    Days per week: Not on file  Minutes per session: Not on file  . Stress: Not on file  Relationships  . Social connections:    Talks on phone: Not on file    Gets together: Not on file    Attends religious service: Not on file    Active member of club or organization: Not on file    Attends meetings of clubs or organizations: Not on file    Relationship status: Not on file  Other Topics Concern  . Not on file  Social History Narrative  . Not on file    Vitals:   06/29/17 1527  BP: 122/84  Pulse: 100  Resp: 12  Temp: 98.3 F (36.8 C)  SpO2: 98%   Body mass index is 45.66 kg/m.   Physical Exam  Nursing note  and vitals reviewed. Constitutional: She is oriented to person, place, and time. She appears well-developed. No distress.  HENT:  Head: Normocephalic and atraumatic.  Mouth/Throat: Oropharynx is clear and moist and mucous membranes are normal.  Eyes: Pupils are equal, round, and reactive to light. Conjunctivae are normal.  Cardiovascular: Normal rate and regular rhythm.  Respiratory: Effort normal and breath sounds normal. No respiratory distress.  GI: Soft. She exhibits no mass. There is no tenderness. There is no CVA tenderness.  Genitourinary:  Genitourinary Comments: Deferred for gynecologist/next visit  Musculoskeletal: She exhibits no edema or tenderness.  Neurological: She is alert and oriented to person, place, and time.  Antalgic gait (history of knee and ankle pain).  Skin: Skin is warm. No erythema.  Psychiatric: Her speech is normal. Her mood appears anxious.      ASSESSMENT AND PLAN:   Ms. Ania was seen today for vaginal discharge.  Diagnoses and all orders for this visit:  Vaginal discharge -     metroNIDAZOLE (METROGEL) 0.75 % vaginal gel; Place 1 Applicatorful vaginally at bedtime for 7 days.  We discussed different possible etiologies. Because she never been sexually active, STDs tests were not ordered.  Empiric treatment with Metronidazole gel for possible BV.   If symptoms persist or get worse she needs to follow-up with her gynecologist.  She voices understanding and agrees with plan.    Return if symptoms worsen or fail to improve.      Betty G. [place], MD  [facility]. [place] office.

## 2017-06-29 NOTE — Addendum Note (Signed)
Addended by: Charna Elizabeth on: 06/29/2017 04:16 PM   Modules accepted: Orders

## 2017-07-01 LAB — T4, FREE: FREE T4: 1.63 ng/dL (ref 0.82–1.77)

## 2017-07-01 LAB — T3, FREE: T3, Free: 2.6 pg/mL (ref 2.0–4.4)

## 2017-07-01 LAB — TSH: TSH: 1.52 u[IU]/mL (ref 0.450–4.500)

## 2017-08-06 DIAGNOSIS — T161XXA Foreign body in right ear, initial encounter: Secondary | ICD-10-CM | POA: Diagnosis not present

## 2017-09-01 ENCOUNTER — Telehealth: Payer: Self-pay | Admitting: Family Medicine

## 2017-09-01 NOTE — Telephone Encounter (Signed)
Copied from CRM [id number]. Topic: Quick Communication - Rx Refill/Question >> Sep 01, 2017  6:49 PM [name], [name]: Medication: levothyroxine Erline Levine(SYNTHROID, LEVOTHROID) 137 MCG tablet [[id number]][[id number]]   Has the patient contacted their pharmacy? Yes.   (Agent: If no, request that the patient contact the pharmacy for the refill.) (Agent: If yes, when and what did the pharmacy advise?)  Preferred Pharmacy (with phone number or street name): [facility] on [address]  Agent: Please be advised that RX refills may take up to 3 business days. We ask that you follow-up with your pharmacy.

## 2017-09-02 ENCOUNTER — Other Ambulatory Visit: Payer: Self-pay | Admitting: *Deleted

## 2017-09-02 MED ORDER — LEVOTHYROXINE SODIUM 137 MCG PO TABS: 137.0000 ug | ORAL_TABLET | Freq: Two times a day (BID) | ORAL | 2 refills | Status: DC

## 2017-09-02 NOTE — Telephone Encounter (Signed)
Rx for Synthroid 137 mg   Refilled per protocol- LOV/lab: 06/29/17

## 2017-10-29 ENCOUNTER — Telehealth: Payer: Self-pay | Admitting: Family Medicine

## 2017-10-29 DIAGNOSIS — Z0279 Encounter for issue of other medical certificate: Secondary | ICD-10-CM

## 2017-10-29 NOTE — Telephone Encounter (Signed)
Patient dropped off health screening form  Mail to the patients address  Disposition: Dr's Editor, commissioning

## 2017-11-01 NOTE — Telephone Encounter (Signed)
Form placed on doctor's desk for completion. 

## 2017-11-01 NOTE — Telephone Encounter (Signed)
Left message informing patient that form was put in the mail.

## 2017-11-16 ENCOUNTER — Ambulatory Visit: Payer: 59 | Admitting: Podiatry

## 2018-05-13 ENCOUNTER — Other Ambulatory Visit: Payer: Self-pay | Admitting: Family Medicine

## 2018-05-13 DIAGNOSIS — Z1231 Encounter for screening mammogram for malignant neoplasm of breast: Secondary | ICD-10-CM

## 2018-06-15 ENCOUNTER — Other Ambulatory Visit: Payer: Self-pay | Admitting: Family Medicine

## 2018-06-20 ENCOUNTER — Telehealth: Payer: Self-pay | Admitting: *Deleted

## 2018-06-20 NOTE — Telephone Encounter (Signed)
Patient called after hours line on 5.16.2020 requesting refill for Levothyroxine. Patient verbalized she knows she needs to make an appointment for refills.   Clinic RN attempted to call and schedule patient an appointment. Patient did not answer. LVM for patient to return call. CRM created

## 2018-06-21 ENCOUNTER — Ambulatory Visit (INDEPENDENT_AMBULATORY_CARE_PROVIDER_SITE_OTHER): Payer: 59 | Admitting: Family Medicine

## 2018-06-21 DIAGNOSIS — N938 Other specified abnormal uterine and vaginal bleeding: Secondary | ICD-10-CM

## 2018-06-21 DIAGNOSIS — D509 Iron deficiency anemia, unspecified: Secondary | ICD-10-CM

## 2018-06-21 DIAGNOSIS — E032 Hypothyroidism due to medicaments and other exogenous substances: Secondary | ICD-10-CM

## 2018-06-21 NOTE — Telephone Encounter (Signed)
Patient called back to make appt.  Patient call back # [phone]

## 2018-06-21 NOTE — Progress Notes (Signed)
Virtual Visit via Video Note   I connected with [name] on 06/22/18 at  4:00 PM EDT by a video enabled telemedicine application and verified that I am speaking with the correct person using two identifiers.  Location patient: home Location provider:home office Persons participating in the virtual visit: patient, provider  I discussed the limitations of evaluation and management by telemedicine and the availability of in person appointments. The patient expressed understanding and agreed to proceed.   HPI:  [name] is a 53 yo female following on hypothyroidism. Currently she  is on Levothyroxine 137 mcg 2 tabs daily. Tolerating medication well, no side effects reported. She has not noted dysphagia, palpitations,changes in bowel habits, tremor, cold/heat intolerance, or abnormal weight loss.  Lab Results  Component Value Date   TSH 1.520 06/29/2017   She is c/o 9 days heavy menstrual flow with blood clots for the past 4 days. She denies sexual activity, she has had irregular menses. Negative for vaginal discharge.  Hx of anemia,she is not longer taking Fe Sulfate.  She has not noted headache,fatigue,chest pain,dyspnea,abdominal pain,nausea,vomiting,or urinary symptoms. No pica.  Lab Results  Component Value Date   WBC 4.7 08/11/2016   HGB 11.3 08/11/2016   HCT 35.2 08/11/2016   MCV 86 08/11/2016   PLT 243 08/11/2016     ROS: See pertinent positives and negatives per HPI.  Past Medical History:  Diagnosis Date  . 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 Anemia    past hx   . Asthma    childhood  . Hypothyroidism   . IBS (irritable bowel syndrome)   . Multinodular goiter    s/p radioactive I131 ablation, Iatogenic Hypothyroidism  . Obesity   . Personal history of goiter    with radioactive iodine ablation    Past Surgical History:  Procedure Laterality Date  . BREAST SURGERY     biopsy, right  . WISDOM TOOTH  EXTRACTION      Family History  Problem Relation Age of Onset  . Sarcoidosis Brother   . Colon cancer Brother   . Kidney failure Father   . Prostate cancer Father   . Hypertension Mother   . Diabetes Mother   . Heart disease Mother   . Heart disease Sister   . Pancreatic cancer Brother     Social History   Socioeconomic History  . Marital status: Single    Spouse name: Not on file  . Number of children: 0  . Years of education: 3816  . Highest education level: Not on file  Occupational History  . Occupation: CLINICAL CHEMIST    Employer: [employer]  Social Needs  . Financial resource strain: Not on file  . Food insecurity:    Worry: Not on file    Inability: Not on file  . Transportation needs:    Medical: Not on file    Non-medical: Not on file  Tobacco Use  . Smoking status: Never Smoker  . Smokeless tobacco: Never Used  Substance and Sexual Activity  . Alcohol use: No  . Drug use: No  . Sexual activity: Not on file  Lifestyle  . Physical activity:    Days per week: Not on file    Minutes per session: Not on file  . Stress: Not on file  Relationships  . Social connections:    Talks on phone: Not on file    Gets together: Not on file  Attends religious service: Not on file    Active member of club or organization: Not on file    Attends meetings of clubs or organizations: Not on file    Relationship status: Not on file  . Intimate partner violence:    Fear of current or ex partner: Not on file    Emotionally abused: Not on file    Physically abused: Not on file    Forced sexual activity: Not on file  Other Topics Concern  . Not on file  Social History Narrative  . Not on file     Current Outpatient Medications:  .  levothyroxine (SYNTHROID, LEVOTHROID) 137 MCG tablet, Take 1 tablet (137 mcg total) by mouth 2 (two) times daily., Disp: 180 tablet, Rfl: 2 .  meloxicam (MOBIC) 15 MG tablet, TAKE ONE TABLET BY MOUTH ONCE DAILY AS NEEDED, Disp: 30 tablet,  Rfl: 3  Current Facility-Administered Medications:  .  0.9 %  sodium chloride infusion, 500 mL, Intravenous, Once, Rachael Fee, MD  EXAM:  VITALS per patient if applicable:N/A  GENERAL: alert, oriented, appears well and in no acute distress  HEENT: atraumatic, conjunctiva clear, no obvious facial abnormalities on inspection.  NECK: normal movements of the head and neck  LUNGS: on inspection no signs of respiratory distress, breathing rate appears normal, no obvious gross SOB, gasping or wheezing  CV: no obvious cyanosis  MS: moves all visible extremities without noticeable abnormality  PSYCH/NEURO: pleasant and cooperative, no obvious depression or anxiety, speech and thought processing grossly intact  ASSESSMENT AND PLAN:  Discussed the following assessment and plan: Orders Placed This Encounter  Procedures  . TSH  . CBC    DUB (dysfunctional uterine bleeding) - Plan: CBC Possible etiologies discussed as well as treatment options. She has had abnormal vaginal bleeding in the past. She has missed appt with gyn. Last pap smear neg and negative HPV. Denies sexual activity. Instructed about warning signs,she is going to need gyn evaluation   Iatrogenic hypothyroidism Continue levothyroxine 274 mcg daily. Medication dose will be adjusted according to TSH, lab appointment will be arranged.  Anemia, iron deficiency H/H was in normal range. For now not recommending resuming ferrous sulfate, we will make recommendations according to CBC results. Instructed about warning signs.  She prefers to hold on med refills until TSH result is back.   I discussed the assessment and treatment plan with the patient. She was provided an opportunity to ask questions and all were answered. The patient agreed with the plan and demonstrated an understanding of the instructions.   The patient was advised to call back or seek an in-person evaluation if the symptoms worsen or if the  condition fails to improve as anticipated.  Return in about 1 year (around 06/21/2019) for 6-12 months for CPE.    Jerrilynn Mikowski [place], MD

## 2018-06-21 NOTE — Telephone Encounter (Signed)
Virtual appointment made 

## 2018-06-21 NOTE — Assessment & Plan Note (Signed)
H/H was in normal range. For now not recommending resuming ferrous sulfate, we will make recommendations according to CBC results. Instructed about warning signs.

## 2018-06-21 NOTE — Assessment & Plan Note (Signed)
Continue levothyroxine 274 mcg daily. Medication dose will be adjusted according to TSH, lab appointment will be arranged.

## 2018-06-21 NOTE — Telephone Encounter (Signed)
FYI sent to Dr. Jordan 

## 2018-06-22 ENCOUNTER — Encounter: Payer: Self-pay | Admitting: Family Medicine

## 2018-06-22 ENCOUNTER — Ambulatory Visit: Payer: 59 | Admitting: Family Medicine

## 2018-06-22 NOTE — Telephone Encounter (Signed)
She wanted to hold on sending Rx for Levothyroxine until TSH result is back. Hassan Blackshire [place], MD

## 2018-06-23 ENCOUNTER — Telehealth: Payer: Self-pay | Admitting: *Deleted

## 2018-06-23 NOTE — Telephone Encounter (Signed)
Spoke with patient and she stated that she is coming in for her labs this afternoon, but she wanted to let Dr. Sw[place]and know about her menstrual just in case she needed to adjust or add some medication to help with stopping her menstrual.  Copied from CRM [id number]. Topic: General - Other >> Jun 23, 2018 10:00 AM Jaquita Rector A wrote: Reason for CRM: Patient called to say that she is still having her menstrual cycle it is now day 11 or 12 and want a call back to let her know if she should still go in to have the blood work done that was requested. Please advise Ph# [phone]

## 2018-06-24 ENCOUNTER — Other Ambulatory Visit: Payer: 59

## 2018-06-24 ENCOUNTER — Other Ambulatory Visit: Payer: Self-pay

## 2018-06-24 DIAGNOSIS — D509 Iron deficiency anemia, unspecified: Secondary | ICD-10-CM

## 2018-06-24 DIAGNOSIS — N938 Other specified abnormal uterine and vaginal bleeding: Secondary | ICD-10-CM

## 2018-06-24 DIAGNOSIS — E032 Hypothyroidism due to medicaments and other exogenous substances: Secondary | ICD-10-CM

## 2018-06-24 NOTE — Addendum Note (Signed)
Addended by: Bonnye Fava on: 06/24/2018 03:27 PM   Modules accepted: Orders

## 2018-06-24 NOTE — Telephone Encounter (Signed)
Recommend gyn evaluation.She is going to need a pelvic/transvaginal US as part of work-up recommended for vagina bleeding at her age. Hormonal therapy (OCP) is usually recommended if other etiologies are ruled out.  [We did not discuss this problem in detail. If she refuses seeing gyn] we can arrange visit to follow on this problem,ideally here in the office,she is going to need a pelvic exam. CBC was ordered because Hx of anemia,which can be aggravated by persistent bleeding.  Again ideally she needs to be evaluated by gyn.  Thanks, BJ

## 2018-06-25 LAB — CBC WITH DIFFERENTIAL/PLATELET
Basophils Absolute: 0 x10E3/uL (ref 0.0–0.2)
Basos: 1 %
EOS (ABSOLUTE): 0.1 x10E3/uL (ref 0.0–0.4)
Eos: 3 %
Hematocrit: 32.9 % — ABNORMAL LOW (ref 34.0–46.6)
Hemoglobin: 10.6 g/dL — ABNORMAL LOW (ref 11.1–15.9)
Immature Grans (Abs): 0 x10E3/uL (ref 0.0–0.1)
Immature Granulocytes: 1 %
Lymphocytes Absolute: 1 x10E3/uL (ref 0.7–3.1)
Lymphs: 25 %
MCH: 26.3 pg — ABNORMAL LOW (ref 26.6–33.0)
MCHC: 32.2 g/dL (ref 31.5–35.7)
MCV: 82 fL (ref 79–97)
Monocytes Absolute: 0.3 x10E3/uL (ref 0.1–0.9)
Monocytes: 8 %
Neutrophils Absolute: 2.6 x10E3/uL (ref 1.4–7.0)
Neutrophils: 62 %
Platelets: 299 x10E3/uL (ref 150–450)
RBC: 4.03 x10E6/uL (ref 3.77–5.28)
RDW: 14.4 % (ref 11.7–15.4)
WBC: 4.1 x10E3/uL (ref 3.4–10.8)

## 2018-06-25 LAB — TSH: TSH: 0.065 u[IU]/mL — ABNORMAL LOW (ref 0.450–4.500)

## 2018-06-29 ENCOUNTER — Telehealth: Payer: Self-pay | Admitting: *Deleted

## 2018-06-29 ENCOUNTER — Encounter: Payer: Self-pay | Admitting: Obstetrics and Gynecology

## 2018-06-29 ENCOUNTER — Other Ambulatory Visit: Payer: Self-pay | Admitting: Family Medicine

## 2018-06-29 ENCOUNTER — Other Ambulatory Visit: Payer: Self-pay | Admitting: *Deleted

## 2018-06-29 DIAGNOSIS — N938 Other specified abnormal uterine and vaginal bleeding: Secondary | ICD-10-CM

## 2018-06-29 MED ORDER — LEVOTHYROXINE SODIUM 137 MCG PO TABS: ORAL_TABLET | ORAL | 2 refills | Status: DC

## 2018-06-29 NOTE — Telephone Encounter (Signed)
Patient informed. Patient agrees with evaluation for gyn. Referral placed.

## 2018-06-29 NOTE — Telephone Encounter (Signed)
Copied from CRM [id number]. Topic: General - Other >> Jun 28, 2018  5:29 PM Jay Schlichter wrote: Reason for CRM:[id number] Pt would like call back about her labs, she is out of thyroid meds

## 2018-07-08 ENCOUNTER — Other Ambulatory Visit: Payer: Self-pay

## 2018-07-12 ENCOUNTER — Other Ambulatory Visit (HOSPITAL_COMMUNITY)
Admission: RE | Admit: 2018-07-12 | Discharge: 2018-07-12 | Disposition: A | Discharge status: Home / Self Care | Payer: 59 | Source: Ambulatory Visit | Attending: Obstetrics and Gynecology | Admitting: Obstetrics and Gynecology

## 2018-07-12 ENCOUNTER — Ambulatory Visit (INDEPENDENT_AMBULATORY_CARE_PROVIDER_SITE_OTHER): Payer: 59 | Admitting: Obstetrics and Gynecology

## 2018-07-12 ENCOUNTER — Encounter: Payer: Self-pay | Admitting: Obstetrics and Gynecology

## 2018-07-12 ENCOUNTER — Other Ambulatory Visit: Payer: Self-pay

## 2018-07-12 VITALS — BP 134/90 | HR 84 | Temp 98.2°F | Ht 67.0 in | Wt 288.4 lb

## 2018-07-12 DIAGNOSIS — N939 Abnormal uterine and vaginal bleeding, unspecified: Secondary | ICD-10-CM

## 2018-07-12 DIAGNOSIS — Z124 Encounter for screening for malignant neoplasm of cervix: Secondary | ICD-10-CM | POA: Insufficient documentation

## 2018-07-12 DIAGNOSIS — E039 Hypothyroidism, unspecified: Secondary | ICD-10-CM | POA: Diagnosis not present

## 2018-07-12 DIAGNOSIS — D500 Iron deficiency anemia secondary to blood loss (chronic): Secondary | ICD-10-CM

## 2018-07-12 DIAGNOSIS — N951 Menopausal and female climacteric states: Secondary | ICD-10-CM

## 2018-07-12 DIAGNOSIS — N898 Other specified noninflammatory disorders of vagina: Secondary | ICD-10-CM

## 2018-07-12 MED ORDER — MEDROXYPROGESTERONE ACETATE 5 MG PO TABS: 5.0000 mg | ORAL_TABLET | Freq: Every day | ORAL | 0 refills | Status: DC

## 2018-07-12 NOTE — Progress Notes (Addendum)
53 y.o. G0P0000 Single Black or African American Not Hispanic or Latino female here for a consultation from Dr SwazilandJordan for abnormal uterine bleeding Period Duration (Days): usually lasts 4 days, last menses lasted 19 days Period Pattern: (!) Irregular Menstrual Flow: Moderate, Light Menstrual Control: Thin pad, Maxi pad Menstrual Control Change Freq (Hours): changing pad every 5 hours Dysmenorrhea: (!) Mild Dysmenorrhea Symptoms: Cramping  She was having monthly cycles until February. She skipped February, in March she had a light cycle for a couple of days at the beginning of the month, bleed again at the end of March (more normal), April was like a normal cycle. Her May cycle started on time, lasted for 19 days, stopped, started bleeding 3 days later for one day. She was saturating a pad in up to 3-4 hours. Minor cramps.  She is on synthroid, Dr Sw[place]ilandJordan checked her TSH in 5/20 which was slightly low, decreased her synthroid. Hgb was 10.6 gm/dl. She was started on iron. She is not currently bleeding.   She has never been sexually active. She is ready to date. She has a long term female friend of 20 years, now planning to pass the friend zone (slowly). She has been saving herself for marriage.  She is having hot flashes and night sweat intermittently. She is waking up ~3 x a week with night sweats.   She lost 2 siblings in 2018, in 12/18 other brother diagnosed with Pancreatic cancer, stressed since then.   Patient's last menstrual period was 06/13/2018.          Sexually active: No.  The current method of family planning is none.    Exercising: Yes.    cardio Smoker:  no  Health Maintenance: Pap:  02/25/2016 WNL, NEG HPV History of abnormal Pap:  no MMG:  06/17/2017 Birads 1 negative, scheduled for 07/20/18 Colonoscopy: 02/23/2017 WNL  TDaP:  UTD per patient Gardasil: N/A   reports that she has never smoked. She has never used smokeless tobacco. She reports that she does not drink alcohol or  use drugs. Works at [employer] in [employer], runs clinical tests.   Past Medical History:  Diagnosis Date  . 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 Anemia    past hx   . Asthma    childhood  . Hypothyroidism   . IBS (irritable bowel syndrome)   . Multinodular goiter    s/p radioactive I131 ablation, Iatogenic Hypothyroidism  . Obesity   . Personal history of goiter    with radioactive iodine ablation    Past Surgical History:  Procedure Laterality Date  . BREAST SURGERY     biopsy, right  . WISDOM TOOTH EXTRACTION      Current Outpatient Medications  Medication Sig Dispense Refill  . FERROUS SULFATE PO Take by mouth daily.    Marland Kitchen. levothyroxine (SYNTHROID) 137 MCG tablet TAKE 1 TABLET BY MOUTH TWICE DAILY 180 tablet 2  . MELATONIN GUMMIES PO Take by mouth as needed.    . meloxicam (MOBIC) 15 MG tablet TAKE ONE TABLET BY MOUTH ONCE DAILY AS NEEDED 30 tablet 3  . naproxen sodium (ALEVE) 220 MG tablet Take 220 mg by mouth as needed.     Current Facility-Administered Medications  Medication Dose Route Frequency Provider Last Rate Last Dose  . 0.9 %  sodium chloride infusion  500 mL Intravenous Once Rachael FeeJacobs, Daniel P, MD        Family History  Problem Relation Age of Onset  . Sarcoidosis Brother   . Colon cancer Brother   . Kidney failure Father   . Prostate cancer Father   . Hypertension Mother   . Diabetes Mother   . Heart disease Mother   . Heart disease Sister   . Pancreatic cancer Brother     Review of Systems  Constitutional: Negative.   HENT: Negative.   Eyes: Negative.   Respiratory: Negative.   Cardiovascular: Negative.   Gastrointestinal: Negative.   Endocrine: Negative.   Genitourinary: Positive for menstrual problem and vaginal bleeding.  Musculoskeletal: Negative.   Skin: Negative.   Allergic/Immunologic: Negative.   Neurological: Negative.   Hematological: Negative.   Psychiatric/Behavioral:  Negative.     Exam:   BP 134/90 (BP Location: Right Arm, Patient Position: Sitting, Cuff Size: Large)   Pulse 84   Temp 98.2 F (36.8 C) (Skin)   Ht 5\' 7"  (1.702 m)   Wt 288 lb 6.4 oz (130.8 kg)   LMP 06/13/2018   BMI 45.17 kg/m   Weight change: @WEIGHTCHANGE @ Height:   Height: 5\' 7"  (170.2 cm)  Ht Readings from Last 3 Encounters:  07/12/18 5\' 7"  (1.702 m)  06/29/17 5\' 7"  (1.702 m)  05/12/17 5\' 7"  (1.702 m)    General appearance: alert, cooperative and appears stated age Head: Normocephalic, without obvious abnormality, atraumatic Neck: no adenopathy, supple, symmetrical, trachea midline and thyroid normal to inspection and palpation Lungs: clear to auscultation bilaterally Cardiovascular: regular rate and rhythm Breasts: normal appearance, no masses or tenderness Abdomen: soft, non-tender; non distended,  no masses,  no organomegaly Extremities: extremities normal, atraumatic, no cyanosis or edema Skin: Skin color, texture, turgor normal. No rashes or lesions Lymph nodes: Cervical, supraclavicular, and axillary nodes normal. No abnormal inguinal nodes palpated Neurologic: Grossly normal   Pelvic: External genitalia:  no lesions              Urethra:  normal appearing urethra with no masses, tenderness or lesions              Bartholins and Skenes: normal                 Vagina: normal appearing vagina with normal color and discharge, no lesions              Cervix: no lesions               Bimanual Exam:  Uterus:  limited by BMI, no obvious masses, not tender              Adnexa: no mass, fullness, tenderness               Rectovaginal: Confirms               Anus:  normal sphincter tone, no lesions  The risks of endometrial biopsy were reviewed and a consent was obtained.  A speculum was placed in the vagina and the cervix was cleansed with betadine. A tenaculum was placed on the cervix and the mini-pipelle was placed into the endometrial cavity. The uterus sounded to  ~8 cm. The endometrial biopsy was performed, moderate tissue was obtained. The tenaculum and speculum were removed. There were no complications.    Chaperone was present for exam.  Images and report from pelvic ultrasound in 2/18 were reviewed  A:  Abnormal uterine bleeding, suspect anovulatory  Perimenopausal  Hypothyroid, synthroid recently adjusted (was over suppressed)  Anemia, on iron  P:   Pap  with hpv  Endometrial biopsy  Provera 5 mg x 5 days now  Further plans after her biopsy returns  Continue iron  Mammogram later this month  Reviewed breast self exam   Colonoscopy UTD  CC: Dr [place]andJordan Note sent  Addendum: The patient c/o a vaginal odor. Affirm sent.

## 2018-07-12 NOTE — Addendum Note (Signed)
Addended by: Dorothy Spark on: 07/12/2018 04:56 PM   Modules accepted: Orders

## 2018-07-12 NOTE — Patient Instructions (Signed)
Endometrial Biopsy Post-procedure Instructions  Cramping is common.  You may take Ibuprofen, Aleve, or Tylenol for the cramping.  This should resolve within 24 hours.    You may have a small amount of spotting.  You should wear a mini pad for the next few days.  You may have intercourse in 24 hours.  You need to call the office if you have any pelvic pain, fever, heavy bleeding, or foul smelling vaginal discharge.  Shower or bathe as normal  You will be notified within one week of your biopsy results or we will discuss your results at your follow-up appointment if needed.   Menopause Menopause is the normal time of life when menstrual periods stop completely. It is usually confirmed by 12 months without a menstrual period. The transition to menopause (perimenopause) most often happens between the ages of 3745 and 555. During perimenopause, hormone levels change in your body, which can cause symptoms and affect your health. Menopause may increase your risk for:  Loss of bone (osteoporosis), which causes bone breaks (fractures).  Depression.  Hardening and narrowing of the arteries (atherosclerosis), which can cause heart attacks and strokes. What are the causes? This condition is usually caused by a natural change in hormone levels that happens as you get older. The condition may also be caused by surgery to remove both ovaries (bilateral oophorectomy). What increases the risk? This condition is more likely to start at an earlier age if you have certain medical conditions or treatments, including:  A tumor of the pituitary gland in the brain.  A disease that affects the ovaries and hormone production.  Radiation treatment for cancer.  Certain cancer treatments, such as chemotherapy or hormone (anti-estrogen) therapy.  Heavy smoking and excessive alcohol use.  Family history of early menopause. This condition is also more likely to develop earlier in women who are very thin. What  are the signs or symptoms? Symptoms of this condition include:  Hot flashes.  Irregular menstrual periods.  Night sweats.  Changes in feelings about sex. This could be a decrease in sex drive or an increased comfort around your sexuality.  Vaginal dryness and thinning of the vaginal walls. This may cause painful intercourse.  Dryness of the skin and development of wrinkles.  Headaches.  Problems sleeping (insomnia).  Mood swings or irritability.  Memory problems.  Weight gain.  Hair growth on the face and chest.  Bladder infections or problems with urinating. How is this diagnosed? This condition is diagnosed based on your medical history, a physical exam, your age, your menstrual history, and your symptoms. Hormone tests may also be done. How is this treated? In some cases, no treatment is needed. You and your health care provider should make a decision together about whether treatment is necessary. Treatment will be based on your individual condition and preferences. Treatment for this condition focuses on managing symptoms. Treatment may include:  Menopausal hormone therapy (MHT).  Medicines to treat specific symptoms or complications.  Acupuncture.  Vitamin or herbal supplements. Before starting treatment, make sure to let your health care provider know if you have a personal or family history of:  Heart disease.  Breast cancer.  Blood clots.  Diabetes.  Osteoporosis. Follow these instructions at home: Lifestyle  Do not use any products that contain nicotine or tobacco, such as cigarettes and e-cigarettes. If you need help quitting, ask your health care provider.  Get at least 30 minutes of physical activity on 5 or more days each week.  Avoid alcoholic and caffeinated beverages, as well as spicy foods. This may help prevent hot flashes.  Get 7-8 hours of sleep each night.  If you have hot flashes, try: ? Dressing in layers. ? Avoiding things that  may trigger hot flashes, such as spicy food, warm places, or stress. ? Taking slow, deep breaths when a hot flash starts. ? Keeping a fan in your home and office.  Find ways to manage stress, such as deep breathing, meditation, or journaling.  Consider going to group therapy with other women who are having menopause symptoms. Ask your health care provider about recommended group therapy meetings. Eating and drinking  Eat a healthy, balanced diet that contains whole grains, lean protein, low-fat dairy, and plenty of fruits and vegetables.  Your health care provider may recommend adding more soy to your diet. Foods that contain soy include tofu, tempeh, and soy milk.  Eat plenty of foods that contain calcium and vitamin D for bone health. Items that are rich in calcium include low-fat milk, yogurt, beans, almonds, sardines, broccoli, and kale. Medicines  Take over-the-counter and prescription medicines only as told by your health care provider.  Talk with your health care provider before starting any herbal supplements. If prescribed, take vitamins and supplements as told by your health care provider. These may include: ? Calcium. Women age 8 and older should get 1,200 mg (milligrams) of calcium every day. ? Vitamin D. Women need 600-800 International Units of vitamin D each day. ? Vitamins B12 and B6. Aim for 50 micrograms of B12 and 1.5 mg of B6 each day. General instructions  Keep track of your menstrual periods, including: ? When they occur. ? How heavy they are and how long they last. ? How much time passes between periods.  Keep track of your symptoms, noting when they start, how often you have them, and how long they last.  Use vaginal lubricants or moisturizers to help with vaginal dryness and improve comfort during sex.  Keep all follow-up visits as told by your health care provider. This is important. This includes any group therapy or counseling. Contact a health care  provider if:  You are still having menstrual periods after age 71.  You have pain during sex.  You have not had a period for 12 months and you develop vaginal bleeding. Get help right away if:  You have: ? Severe depression. ? Excessive vaginal bleeding. ? Pain when you urinate. ? A fast or irregular heart beat (palpitations). ? Severe headaches. ? Abdomen (abdominal) pain or severe indigestion.  You fell and you think you have a broken bone.  You develop leg or chest pain.  You develop vision problems.  You feel a lump in your breast. Summary  Menopause is the normal time of life when menstrual periods stop completely. It is usually confirmed by 12 months without a menstrual period.  The transition to menopause (perimenopause) most often happens between the ages of 47 and 44.  Symptoms can be managed through medicines, lifestyle changes, and complementary therapies such as acupuncture.  Eat a balanced diet that is rich in nutrients to promote bone health and heart health and to manage symptoms during menopause. This information is not intended to replace advice given to you by your health care provider. Make sure you discuss any questions you have with your health care provider. Document Released: 04/11/2003 Document Revised: 02/22/2016 Document Reviewed: 02/22/2016 Elsevier Interactive Patient Education  2019 [place] Breast self-awareness  means being familiar with how your breasts look and feel. It involves checking your breasts regularly and reporting any changes to your health care provider. Practicing breast self-awareness is important. A change in your breasts can be a sign of a serious medical problem. Being familiar with how your breasts look and feel allows you to find any problems early, when treatment is more likely to be successful. All women should practice breast self-awareness, including women who have had breast implants. How to do  a breast self-exam One way to learn what is normal for your breasts and whether your breasts are changing is to do a breast self-exam. To do a breast self-exam: Look for Changes  1. Remove all the clothing above your waist. 2. Stand in front of a mirror in a room with good lighting. 3. Put your hands on your hips. 4. Push your hands firmly downward. 5. Compare your breasts in the mirror. Look for differences between them (asymmetry), such as: ? Differences in shape. ? Differences in size. ? Puckers, dips, and bumps in one breast and not the other. 6. Look at each breast for changes in your skin, such as: ? Redness. ? Scaly areas. 7. Look for changes in your nipples, such as: ? Discharge. ? Bleeding. ? Dimpling. ? Redness. ? A change in position. Feel for Changes Carefully feel your breasts for lumps and changes. It is best to do this while lying on your back on the floor and again while sitting or standing in the shower or tub with soapy water on your skin. Feel each breast in the following way:  Place the arm on the side of the breast you are examining above your head.  Feel your breast with the other hand.  Start in the nipple area and make  inch (2 cm) overlapping circles to feel your breast. Use the pads of your three middle fingers to do this. Apply light pressure, then medium pressure, then firm pressure. The light pressure will allow you to feel the tissue closest to the skin. The medium pressure will allow you to feel the tissue that is a little deeper. The firm pressure will allow you to feel the tissue close to the ribs.  Continue the overlapping circles, moving downward over the breast until you feel your ribs below your breast.  Move one finger-width toward the center of the body. Continue to use the  inch (2 cm) overlapping circles to feel your breast as you move slowly up toward your collarbone.  Continue the up and down exam using all three pressures until you reach  your armpit.  Write Down What You Find  Write down what is normal for each breast and any changes that you find. Keep a written record with breast changes or normal findings for each breast. By writing this information down, you do not need to depend only on memory for size, tenderness, or location. Write down where you are in your menstrual cycle, if you are still menstruating. If you are having trouble noticing differences in your breasts, do not get discouraged. With time you will become more familiar with the variations in your breasts and more comfortable with the exam. How often should I examine my breasts? Examine your breasts every month. If you are breastfeeding, the best time to examine your breasts is after a feeding or after using a breast pump. If you menstruate, the best time to examine your breasts is 5-7 days after your period is over. During  your period, your breasts are lumpier, and it may be more difficult to notice changes. When should I see my health care provider? See your health care provider if you notice:  A change in shape or size of your breasts or nipples.  A change in the skin of your breast or nipples, such as a reddened or scaly area.  Unusual discharge from your nipples.  A lump or thick area that was not there before.  Pain in your breasts.  Anything that concerns you. This information is not intended to replace advice given to you by your health care provider. Make sure you discuss any questions you have with your health care provider. Document Released: 01/19/2005 Document Revised: 06/27/2015 Document Reviewed: 12/09/2014 Elsevier Interactive Patient Education  2019 [employer]

## 2018-07-14 LAB — CYTOLOGY - PAP
Diagnosis: NEGATIVE
HPV: NOT DETECTED

## 2018-07-14 LAB — VAGINITIS/VAGINOSIS, DNA PROBE
Candida Species: NEGATIVE
Gardnerella vaginalis: NEGATIVE
Trichomonas vaginosis: NEGATIVE

## 2018-07-18 ENCOUNTER — Telehealth: Payer: Self-pay

## 2018-07-18 NOTE — Telephone Encounter (Signed)
-----   Message from Salvadore Dom, MD sent at 07/14/2018  6:07 PM EDT ----- [name]:  Please advise the patient of normal results (pap and biopsy). 02 recall. She was given a script for provera to take for 5 days. Please call in another script for provera, 5 mg x 5 days every other month if no spontaneous cycles. I think her bleeding is secondary to her being perimenopausal. Please have her calendar all of her bleeding and f/u in 3 months CC: Dr Martinique

## 2018-07-18 NOTE — Telephone Encounter (Signed)
Left message to call Sanyiah Kanzler at [phone]. 

## 2018-07-19 MED ORDER — MEDROXYPROGESTERONE ACETATE 5 MG PO TABS: ORAL_TABLET | ORAL | 0 refills | Status: DC

## 2018-07-19 NOTE — Telephone Encounter (Signed)
Patient is returning call to Kaitlyn.  

## 2018-07-19 NOTE — Telephone Encounter (Signed)
Spoke with patient. Results given. Patient verbalizes understanding. 3 month follow up scheduled for 10/20/2018 at 4 pm with Dr.Jertson. Patient verbalizes understanding. Rx for Provera 5 mg x 5 days every other month is no spontaneous cycle #15 0RF sent to pharmacy on file.  Routing to provider and will close encounter.

## 2018-07-20 ENCOUNTER — Ambulatory Visit
Admission: RE | Admit: 2018-07-20 | Discharge: 2018-07-20 | Disposition: A | Discharge status: Home / Self Care | Payer: 59 | Source: Ambulatory Visit | Attending: Family Medicine | Admitting: Family Medicine

## 2018-07-20 ENCOUNTER — Other Ambulatory Visit: Payer: Self-pay

## 2018-07-20 DIAGNOSIS — Z1231 Encounter for screening mammogram for malignant neoplasm of breast: Secondary | ICD-10-CM

## 2018-07-25 ENCOUNTER — Telehealth: Payer: Self-pay | Admitting: Obstetrics and Gynecology

## 2018-07-25 MED ORDER — MEDROXYPROGESTERONE ACETATE 5 MG PO TABS: ORAL_TABLET | ORAL | 0 refills | Status: DC

## 2018-07-25 NOTE — Telephone Encounter (Signed)
Patient is calling regarding prescription for Provera 5MG  tablet. Patient stated that the pharmacy did not have the medication when she went to go pick it up.

## 2018-07-25 NOTE — Telephone Encounter (Signed)
Spoke with patient. She states pharmacy didn't have RX for Provera. Patient has 2 pharmacies on file. Verified which pharmacy she is currently using. She states [facility]. Advised patient unfortunately Provera Rx was sent to [facility]. Patient asked if we could send to [facility].   Called [facility] and cancelled Provera Rx and sent Rx for Provera 5mg  #15/NR to [facility]. Patient to take Provera 5mg  x 5 days every other month if no cycle.  Routed to Dr.Jertson to sign and close.

## 2018-08-30 NOTE — Telephone Encounter (Signed)
OK to close encounter or is further follow up necessary? °

## 2018-09-26 ENCOUNTER — Telehealth: Payer: Self-pay | Admitting: *Deleted

## 2018-09-26 NOTE — Telephone Encounter (Signed)
Left message for patient to return call to office to schedule follow-up to recheck TSH due to medication change in 06/2018 per Dr. Martinique.

## 2018-10-03 ENCOUNTER — Other Ambulatory Visit: Payer: Self-pay

## 2018-10-03 ENCOUNTER — Ambulatory Visit (INDEPENDENT_AMBULATORY_CARE_PROVIDER_SITE_OTHER): Payer: 59 | Admitting: Family Medicine

## 2018-10-03 ENCOUNTER — Encounter: Payer: Self-pay | Admitting: Family Medicine

## 2018-10-03 VITALS — BP 132/84 | HR 91 | Temp 97.7°F | Resp 12 | Ht 67.0 in | Wt 284.4 lb

## 2018-10-03 DIAGNOSIS — E032 Hypothyroidism due to medicaments and other exogenous substances: Secondary | ICD-10-CM | POA: Diagnosis not present

## 2018-10-03 NOTE — Patient Instructions (Signed)
A few things to remember from today's visit:   Iatrogenic hypothyroidism - Plan: T4, free, TSH    Continue following a healthful diet and regular physical activity. We will arrange labs depending of thyroid lab results.

## 2018-10-03 NOTE — Progress Notes (Signed)
HPI:   [name] is a 53 y.o. female, who is here today for chronic disease management.  Since her last visit she has follow-up with gynecologist. History of abnormal uterine bleeding, currently she is on medroxyprogesterone 5 mg daily, probably has improved. She has Pap smear and endometrial biopsy done otherwise normal.  Hypothyroidism: Currently levothyroxine 137 mcg twice daily, dose was decreased 2-3 months ago from 250 mcg bid. S/P radioactive I131 ablation. Tolerating medication well, no side effects reported. She has not noted dysphagia, palpitations, abdominal pain, changes in bowel habits, tremor, cold/heat intolerance, or abnormal weight loss.  Lab Results  Component Value Date   TSH 0.065 (L) 06/24/2018   She is exercising regularly and has got big portions. She has noted some weight loss.   Review of Systems  Constitutional: Negative for activity change, appetite change and fever.  HENT: Negative for mouth sores, nosebleeds, sore throat and trouble swallowing.   Eyes: Negative for redness and visual disturbance.  Respiratory: Negative for cough, shortness of breath and wheezing.   Cardiovascular: Negative for chest pain, palpitations and leg swelling.  Gastrointestinal: Negative for abdominal pain, nausea and vomiting.       Negative for changes in bowel habits.  Genitourinary: Negative for decreased urine volume and hematuria.  Neurological: Negative for tremors, syncope, weakness and headaches.  Rest see pertinent positives and negatives per HPI.   Current Outpatient Medications on File Prior to Visit  Medication Sig Dispense Refill  . FERROUS SULFATE PO Take by mouth daily.     . medroxyPROGESTERone (PROVERA) 5 MG tablet Take 1 tablet (5 mg total) by mouth daily. 5 tablet 0  . MELATONIN GUMMIES PO Take by mouth as needed.    . meloxicam (MOBIC) 15 MG tablet TAKE ONE TABLET BY MOUTH ONCE DAILY AS NEEDED 30 tablet 3  . naproxen sodium  (ALEVE) 220 MG tablet Take 220 mg by mouth as needed.     Current Facility-Administered Medications on File Prior to Visit  Medication Dose Route Frequency Provider Last Rate Last Dose  . 0.9 %  sodium chloride infusion  500 mL Intravenous Once Rachael FeeJacobs, Daniel P, MD         Past Medical History:  Diagnosis Date  . 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 Anemia    past hx   . Asthma    childhood  . Hypothyroidism   . IBS (irritable bowel syndrome)   . Multinodular goiter    s/p radioactive I131 ablation, Iatogenic Hypothyroidism  . Obesity   . Personal history of goiter    with radioactive iodine ablation   Allergies  Allergen Reactions  . Sulfonamide Derivatives Hives    Social History   Socioeconomic History  . Marital status: Single    Spouse name: Not on file  . Number of children: 0  . Years of education: 1016  . Highest education level: Not on file  Occupational History  . Occupation: CLINICAL CHEMIST    Employer: [employer]  Social Needs  . Financial resource strain: Not on file  . Food insecurity    Worry: Not on file    Inability: Not on file  . Transportation needs    Medical: Not on file    Non-medical: Not on file  Tobacco Use  . Smoking status: Never Smoker  . Smokeless tobacco: Never Used  Substance and Sexual Activity  . Alcohol use: No  .  Drug use: No  . Sexual activity: Not Currently    Birth control/protection: None  Lifestyle  . Physical activity    Days per week: Not on file    Minutes per session: Not on file  . Stress: Not on file  Relationships  . Social Herbalist on phone: Not on file    Gets together: Not on file    Attends religious service: Not on file    Active member of club or organization: Not on file    Attends meetings of clubs or organizations: Not on file    Relationship status: Not on file  Other Topics Concern  . Not on file  Social History Narrative  . Not on  file    Vitals:   10/03/18 0911  BP: 132/84  Pulse: 91  Resp: 12  Temp: 97.7 F (36.5 C)  SpO2: 100%   Body mass index is 44.54 kg/m.  Wt Readings from Last 3 Encounters:  10/03/18 284 lb 6 oz (129 kg)  07/12/18 288 lb 6.4 oz (130.8 kg)  06/29/17 291 lb 8 oz (132.2 kg)   Physical Exam  Nursing note and vitals reviewed. Constitutional: She is oriented to person, place, and time. She appears well-developed. No distress.  HENT:  Head: Normocephalic and atraumatic.  Mouth/Throat: Oropharynx is clear and moist and mucous membranes are normal.  Eyes: Pupils are equal, round, and reactive to light. Conjunctivae are normal.  Cardiovascular: Normal rate and regular rhythm.  No murmur heard. Respiratory: Effort normal and breath sounds normal. No respiratory distress.  GI: Soft. She exhibits no mass. There is no hepatomegaly. There is no abdominal tenderness.  Musculoskeletal:        General: No edema.  Lymphadenopathy:    She has no cervical adenopathy.  Neurological: She is alert and oriented to person, place, and time. She has normal strength. No cranial nerve deficit. Gait normal.  Skin: Skin is warm. No rash noted. No erythema.  Psychiatric: She has a normal mood and affect.  Well groomed, good eye contact.    ASSESSMENT AND PLAN:  [name] was seen today for follow-up.  Diagnoses and all orders for this visit:  Iatrogenic hypothyroidism No changes in current management, will follow labs done today and will give further recommendations accordingly.  -     TSH -     levothyroxine (SYNTHROID) 137 MCG tablet; TAKE 1 TABLET BY MOUTH TWICE Wed,thurs,and Friday. Once daily Mon,Thus,Sat,and Sun. -     TSH; Future  Obesity, Class III, BMI 40-49.9 (morbid obesity) ([place]) We discussed benefits of wt loss as well as adverse effects of obesity. Encouraged to continue working on wt loss. Consistency with healthy diet and physical activity recommended.   Return in about 1  year (around 10/03/2019) for cpe.    Betty G. Martinique, MD  [facility]. [place] office.

## 2018-10-04 ENCOUNTER — Encounter: Payer: Self-pay | Admitting: Family Medicine

## 2018-10-04 LAB — T4, FREE: Free T4: 2.1 ng/dL — ABNORMAL HIGH (ref 0.82–1.77)

## 2018-10-04 LAB — TSH: TSH: 0.064 u[IU]/mL — ABNORMAL LOW (ref 0.450–4.500)

## 2018-10-04 MED ORDER — LEVOTHYROXINE SODIUM 137 MCG PO TABS: ORAL_TABLET | ORAL | 0 refills | Status: DC

## 2018-10-18 NOTE — Progress Notes (Signed)
GYNECOLOGY  VISIT   HPI: 53 y.o.   Single Black or African American Not Hispanic or Latino  female   G0P0000 with Patient's last menstrual period was 10/19/2018.   here for 3 month f/u on provera. She was seen in 6/20 with abnormal perimenopausal bleeding. Endometrial biopsy with inactive endometrium. She was treated with provera, bleeding stopped, no bleeding when she stopped the Provera. Patient states that she had a normal period June lasted x 5.5 days. A month later on July 25 she bleed x 5.5 days. Then bleed again on August 18 (one week early) x 5 days. August cycle was heavy, saturated a 10 hour pad in 10 hours, heavier than it had been. Patient started spotting yesterday on 10/19/18, still spotting to light flow.   Her vasomotor symptoms are currently better.   She has never been sexually active, but is considering it. She is getting closer and closer to her female friend of 20 years.     GYNECOLOGIC HISTORY: Patient's last menstrual period was 10/19/2018. Contraception:Abstinence Menopausal hormone therapy: none        OB History    Gravida  0   Para  0   Term  0   Preterm  0   AB  0   Living  0     SAB  0   TAB  0   Ectopic  0   Multiple  0   Live Births  0              Patient Active Problem List   Diagnosis Date Noted  . Arthralgia of multiple sites, bilateral 08/13/2015  . Anemia, iron deficiency 08/13/2015  . Heel spur 05/07/2015  . Achilles tendinitis of left lower extremity 05/07/2015  . Family history of coronary artery disease 04/09/2014  . Somnolence 06/02/2013  . Obesity, Class III, BMI 40-49.9 (morbid obesity) (HCC) 06/02/2013  . Disturbance of skin sensation 03/23/2012  . Peripheral neuropathy 02/23/2012  . Anemia 02/23/2012  . Paraesthesia of skin 06/23/2010  . LEG PAIN, RIGHT 03/13/2010  . BACK PAIN, THORACIC REGION, RIGHT 03/13/2008  . Iatrogenic hypothyroidism 11/02/2006  . ASTHMA 11/02/2006  . IRRITABLE BOWEL SYNDROME 11/02/2006  .  BREAST BIOPSY, HX OF 11/02/2006    Past Medical History:  Diagnosis Date  . 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 Anemia    past hx   . Asthma    childhood  . Hypothyroidism   . IBS (irritable bowel syndrome)   . Multinodular goiter    s/p radioactive I131 ablation, Iatogenic Hypothyroidism  . Obesity   . Personal history of goiter    with radioactive iodine ablation    Past Surgical History:  Procedure Laterality Date  . BREAST SURGERY     biopsy, right  . WISDOM TOOTH EXTRACTION      Current Outpatient Medications  Medication Sig Dispense Refill  . FERROUS SULFATE PO Take by mouth daily.     Marland Kitchen. levothyroxine (SYNTHROID) 137 MCG tablet TAKE 1 TABLET BY MOUTH TWICE Wed,thurs,and Friday. Once daily Mon,Thus,Sat,and Sun. 100 tablet 0  . MELATONIN GUMMIES PO Take by mouth as needed.    . meloxicam (MOBIC) 15 MG tablet TAKE ONE TABLET BY MOUTH ONCE DAILY AS NEEDED 30 tablet 3  . naproxen sodium (ALEVE) 220 MG tablet Take 220 mg by mouth as needed.     Current Facility-Administered Medications  Medication Dose Route Frequency Provider Last Rate Last Dose  .  0.9 %  sodium chloride infusion  500 mL Intravenous Once Milus Banister, MD         ALLERGIES: Sulfonamide derivatives  Family History  Problem Relation Age of Onset  . Sarcoidosis Brother   . Colon cancer Brother   . Kidney failure Father   . Prostate cancer Father   . Hypertension Mother   . Diabetes Mother   . Heart disease Mother   . Heart disease Sister   . Pancreatic cancer Brother     Social History   Socioeconomic History  . Marital status: Single    Spouse name: Not on file  . Number of children: 0  . Years of education: 3  . Highest education level: Not on file  Occupational History  . Occupation: CLINICAL CHEMIST    Employer: [employer]  Social Needs  . Financial resource strain: Not on file  . Food insecurity    Worry: Not on file     Inability: Not on file  . Transportation needs    Medical: Not on file    Non-medical: Not on file  Tobacco Use  . Smoking status: Never Smoker  . Smokeless tobacco: Never Used  Substance and Sexual Activity  . Alcohol use: No  . Drug use: No  . Sexual activity: Not Currently    Birth control/protection: None  Lifestyle  . Physical activity    Days per week: Not on file    Minutes per session: Not on file  . Stress: Not on file  Relationships  . Social Herbalist on phone: Not on file    Gets together: Not on file    Attends religious service: Not on file    Active member of club or organization: Not on file    Attends meetings of clubs or organizations: Not on file    Relationship status: Not on file  . Intimate partner violence    Fear of current or ex partner: Not on file    Emotionally abused: Not on file    Physically abused: Not on file    Forced sexual activity: Not on file  Other Topics Concern  . Not on file  Social History Narrative  . Not on file    Review of Systems  Constitutional: Negative.   HENT: Negative.   Eyes: Negative.   Respiratory: Negative.   Cardiovascular: Negative.   Gastrointestinal: Negative.   Genitourinary: Negative.   Musculoskeletal: Negative.   Skin: Negative.   Neurological: Negative.   Endo/Heme/Allergies: Negative.   Psychiatric/Behavioral: Negative.     PHYSICAL EXAMINATION:    BP (!) 146/80 (BP Location: Right Arm, Patient Position: Sitting, Cuff Size: Large)   Pulse 84   Temp (!) 97.3 F (36.3 C) (Temporal)   Resp 12   Wt 286 lb (129.7 kg)   LMP 10/19/2018   BMI 44.79 kg/m     General appearance: alert, cooperative and appears stated age  ASSESSMENT Perimenopause, irregular bleeding, recently better.  Considering being sexually active, discussed contraception    PLAN Calendar cycles Provera every 1-2 months if no spontaneous cycles Discussed the mirena IUD, information given Call with any  concerns F/U for annual exam in 6/20   An After Visit Summary was printed and given to the patient.  ~15 minutes face to face time of which over 50% was spent in counseling.

## 2018-10-20 ENCOUNTER — Encounter: Payer: Self-pay | Admitting: Obstetrics and Gynecology

## 2018-10-20 ENCOUNTER — Ambulatory Visit: Payer: 59 | Admitting: Obstetrics and Gynecology

## 2018-10-20 ENCOUNTER — Other Ambulatory Visit: Payer: Self-pay

## 2018-10-20 VITALS — BP 146/80 | HR 84 | Temp 97.3°F | Resp 12 | Wt 286.0 lb

## 2018-10-20 DIAGNOSIS — N939 Abnormal uterine and vaginal bleeding, unspecified: Secondary | ICD-10-CM | POA: Diagnosis not present

## 2018-10-20 DIAGNOSIS — N951 Menopausal and female climacteric states: Secondary | ICD-10-CM

## 2018-10-20 MED ORDER — MEDROXYPROGESTERONE ACETATE 5 MG PO TABS: ORAL_TABLET | ORAL | 2 refills | Status: DC

## 2018-11-21 ENCOUNTER — Other Ambulatory Visit: Payer: Self-pay

## 2018-11-21 ENCOUNTER — Other Ambulatory Visit (INDEPENDENT_AMBULATORY_CARE_PROVIDER_SITE_OTHER): Payer: 59

## 2018-11-21 DIAGNOSIS — E032 Hypothyroidism due to medicaments and other exogenous substances: Secondary | ICD-10-CM | POA: Diagnosis not present

## 2018-11-21 DIAGNOSIS — Z23 Encounter for immunization: Secondary | ICD-10-CM | POA: Diagnosis not present

## 2018-11-21 LAB — TSH: TSH: 2.23 u[IU]/mL (ref 0.35–4.50)

## 2018-12-02 ENCOUNTER — Telehealth: Payer: Self-pay | Admitting: *Deleted

## 2018-12-02 NOTE — Telephone Encounter (Signed)
Patient called for result:  Notes recorded by Martinique, Betty G, MD on 11/28/2018 at 10:07 PM EDT  Normal thyroid function test.  Continue current dose of levothyroxine.   Patient notified of result and PCP recommendation.

## 2018-12-08 ENCOUNTER — Ambulatory Visit: Payer: 59

## 2018-12-08 ENCOUNTER — Encounter: Payer: Self-pay | Admitting: Podiatry

## 2018-12-08 ENCOUNTER — Ambulatory Visit: Payer: 59 | Admitting: Podiatry

## 2018-12-08 ENCOUNTER — Other Ambulatory Visit: Payer: Self-pay

## 2018-12-08 DIAGNOSIS — M79671 Pain in right foot: Secondary | ICD-10-CM | POA: Diagnosis not present

## 2018-12-08 DIAGNOSIS — M778 Other enthesopathies, not elsewhere classified: Secondary | ICD-10-CM | POA: Diagnosis not present

## 2018-12-08 DIAGNOSIS — M2550 Pain in unspecified joint: Secondary | ICD-10-CM

## 2018-12-08 MED ORDER — MELOXICAM 15 MG PO TABS: ORAL_TABLET | ORAL | 3 refills | Status: DC

## 2018-12-12 ENCOUNTER — Encounter: Payer: Self-pay | Admitting: Podiatry

## 2018-12-12 NOTE — Progress Notes (Signed)
Subjective:  Patient ID: [name], female    DOB: 09-11-1965,  MRN: [id number]  Chief Complaint  Patient presents with  . Foot Pain    Patient presents today for top of right foot pain x 1 week.  She reports its painful to walk and is sharp dull pains "feels like a bruise"  She has been soaking in Epson salt, taking Aleve, and using ice for relief    53 y.o. female presents with the above complaint.  Patient states that she has had this pain for a long period of time.  It has been hurting the top of her right foot.  She is known to [name] who seen her couple of years ago for tendinitis treatment.  Since then the tendinitis has completely resolved.  She states that she is always on her feet.  She denies any other acute pain she states the pain is dull and achy in nature.  She does have history of arthritis and other parts of her body.   Review of Systems: Negative except as noted in the HPI. Denies N/V/F/Ch.  Past Medical History:  Diagnosis Date  . 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 Anemia    past hx   . Asthma    childhood  . Hypothyroidism   . IBS (irritable bowel syndrome)   . Multinodular goiter    s/p radioactive I131 ablation, Iatogenic Hypothyroidism  . Obesity   . Personal history of goiter    with radioactive iodine ablation    Current Outpatient Medications:  .  ibuprofen ([employer] IBUPROFEN) 200 MG tablet, Take 200 mg by mouth every 6 (six) hours as needed., Disp: , Rfl:  .  FERROUS SULFATE PO, Take by mouth daily. , Disp: , Rfl:  .  levothyroxine (SYNTHROID) 137 MCG tablet, TAKE 1 TABLET BY MOUTH TWICE Wed,thurs,and Friday. Once daily Mon,Thus,Sat,and Sun., Disp: 100 tablet, Rfl: 0 .  medroxyPROGESTERone (PROVERA) 5 MG tablet, Take 1 tablet daily x 5 days every 1-2 months if no spontaneous menses., Disp: 15 tablet, Rfl: 2 .  MELATONIN GUMMIES PO, Take by mouth as needed., Disp: , Rfl:  .  meloxicam (MOBIC) 15 MG  tablet, TAKE ONE TABLET BY MOUTH ONCE DAILY AS NEEDED, Disp: 30 tablet, Rfl: 3 .  naproxen sodium (ALEVE) 220 MG tablet, Take 220 mg by mouth as needed., Disp: , Rfl:   Current Facility-Administered Medications:  .  0.9 %  sodium chloride infusion, 500 mL, Intravenous, Once, Milus Banister, MD  Social History   Tobacco Use  Smoking Status Never Smoker  Smokeless Tobacco Never Used    Allergies  Allergen Reactions  . Sulfonamide Derivatives Hives   Objective:  There were no vitals filed for this visit. There is no height or weight on file to calculate BMI. Constitutional Well developed. Well nourished.  Vascular Dorsalis pedis pulses palpable bilaterally. Posterior tibial pulses palpable bilaterally. Capillary refill normal to all digits.  No cyanosis or clubbing noted. Pedal hair growth normal.  Neurologic Normal speech. Oriented to person, place, and time. Epicritic sensation to light touch grossly present bilaterally.  Dermatologic Nails well groomed and normal in appearance. No open wounds. No skin lesions.  Orthopedic:  Pain on palpation to the dorsal midfoot extending laterally.  Mild pain with first TMT J joint range of motion.  No pain elicited with active and passive range of motion extensors and flexor tendons.   Radiographs: 3 views view of  skeletally mature adult right foot: There is severe arthrosis noted at the midfoot joints.  There is plantar and posterior heel spurring noted.  There is a decrease in narrowing unevenly of the first MPJ.  All of these findings are consistent with arthritis. Assessment:   1. Capsulitis of right foot   2. Arthralgia of multiple sites, bilateral    Plan:  Patient was evaluated and treated and all questions answered.  Capsulitis/arthritis of the right dorsal midfoot -I explained to the patient the etiology and all the conservative therapy to treat arthritic changes.  This included immobilization and injection with steroids.   She elected to have steroid injection performed to the dorsal midfoot. -A steroid injection was performed at dorsal midfoot right using 1% plain Lidocaine and 10 mg of Kenalog. This was well tolerated. -I explained to the patient to monitor the length of relief this injection gives.  It may be possible that she may need another injection to help relieve her pain. -I also dispensed meloxicam to help relieve her pain by mouth. -I will see her back in 4 weeks.   Return in about 4 weeks (around 01/05/2019) for Tendonitis.

## 2019-01-05 ENCOUNTER — Ambulatory Visit: Payer: 59 | Admitting: Podiatry

## 2019-01-09 ENCOUNTER — Ambulatory Visit: Payer: 59 | Admitting: Podiatry

## 2019-01-09 ENCOUNTER — Other Ambulatory Visit: Payer: Self-pay

## 2019-01-09 DIAGNOSIS — M2550 Pain in unspecified joint: Secondary | ICD-10-CM | POA: Diagnosis not present

## 2019-01-09 DIAGNOSIS — M79671 Pain in right foot: Secondary | ICD-10-CM | POA: Diagnosis not present

## 2019-01-09 DIAGNOSIS — M778 Other enthesopathies, not elsewhere classified: Secondary | ICD-10-CM

## 2019-01-10 ENCOUNTER — Encounter: Payer: Self-pay | Admitting: Podiatry

## 2019-01-10 NOTE — Progress Notes (Signed)
Subjective:  Patient ID: [name], female    DOB: 02-23-1965,  MRN: [id number]  Chief Complaint  Patient presents with  . Foot Pain    pt is here for a f/u of bil foot pain, pt states that she is doing a lot better since the last time she was here, pt also states that she is still having pain on top of the right foot     53 y.o. female presents with the above complaint.  Patient presents with a follow-up of right dorsal midfoot arthritis.  I gave her injection during last time.  Patient states that it helped her considerably she got 80% better.  She states that she still has some mild pain to the same area.  She would like me one last injection to help completely relieve the pain.  She has tried meloxicam which has considerably helped as well   Review of Systems: Negative except as noted in the HPI. Denies N/V/F/Ch.  Past Medical History:  Diagnosis Date  . 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 Anemia    past hx   . Asthma    childhood  . Hypothyroidism   . IBS (irritable bowel syndrome)   . Multinodular goiter    s/p radioactive I131 ablation, Iatogenic Hypothyroidism  . Obesity   . Personal history of goiter    with radioactive iodine ablation    Current Outpatient Medications:  .  FERROUS SULFATE PO, Take by mouth daily. , Disp: , Rfl:  .  ibuprofen ([employer] IBUPROFEN) 200 MG tablet, Take 200 mg by mouth every 6 (six) hours as needed., Disp: , Rfl:  .  levothyroxine (SYNTHROID) 137 MCG tablet, TAKE 1 TABLET BY MOUTH TWICE Wed,thurs,and Friday. Once daily Mon,Thus,Sat,and Sun., Disp: 100 tablet, Rfl: 0 .  medroxyPROGESTERone (PROVERA) 5 MG tablet, Take 1 tablet daily x 5 days every 1-2 months if no spontaneous menses., Disp: 15 tablet, Rfl: 2 .  MELATONIN GUMMIES PO, Take by mouth as needed., Disp: , Rfl:  .  meloxicam (MOBIC) 15 MG tablet, TAKE ONE TABLET BY MOUTH ONCE DAILY AS NEEDED, Disp: 30 tablet, Rfl: 3 .  naproxen  sodium (ALEVE) 220 MG tablet, Take 220 mg by mouth as needed., Disp: , Rfl:   Current Facility-Administered Medications:  .  0.9 %  sodium chloride infusion, 500 mL, Intravenous, Once, Milus Banister, MD  Social History   Tobacco Use  Smoking Status Never Smoker  Smokeless Tobacco Never Used    Allergies  Allergen Reactions  . Sulfonamide Derivatives Hives   Objective:  There were no vitals filed for this visit. There is no height or weight on file to calculate BMI. Constitutional Well developed. Well nourished.  Vascular Dorsalis pedis pulses palpable bilaterally. Posterior tibial pulses palpable bilaterally. Capillary refill normal to all digits.  No cyanosis or clubbing noted. Pedal hair growth normal.  Neurologic Normal speech. Oriented to person, place, and time. Epicritic sensation to light touch grossly present bilaterally.  Dermatologic Nails well groomed and normal in appearance. No open wounds. No skin lesions.  Orthopedic:  Pain on palpation to the dorsal midfoot extending laterally.  Mild pain with first TMT J joint range of motion.  No pain elicited with active and passive range of motion extensors and flexor tendons.   Radiographs: 3 views view of skeletally mature adult right foot: There is severe arthrosis noted at the midfoot joints.  There is plantar and posterior heel spurring  noted.  There is a decrease in narrowing unevenly of the first MPJ.  All of these findings are consistent with arthritis. Assessment:   No diagnosis found. Plan:  Patient was evaluated and treated and all questions answered.  Capsulitis/arthritis of the right dorsal midfoot -Given the patient has grade in an 80% improvement I believe that patient will benefit from 1 last injection to get her 100% there.  She states the injection has considerably helped.  She also states that meloxicam has helped as well. -Given that her pain has essentially resolved this will be the last time I  see her, I instructed the patient to come see me right away if the pain flares back up or if any other foot issues,. -A steroid injection was performed at right dorsal midfoot using 1% plain Lidocaine and 10 mg of Kenalog. This was well tolerated.    No follow-ups on file.

## 2019-01-16 ENCOUNTER — Telehealth: Payer: Self-pay | Admitting: Family Medicine

## 2019-01-16 DIAGNOSIS — E032 Hypothyroidism due to medicaments and other exogenous substances: Secondary | ICD-10-CM

## 2019-01-16 MED ORDER — LEVOTHYROXINE SODIUM 137 MCG PO TABS: ORAL_TABLET | ORAL | 0 refills | Status: DC

## 2019-01-16 NOTE — Telephone Encounter (Signed)
Medication Refill - Medication:  levothyroxine (SYNTHROID) 137 MCG tablet   90 day supply request! Must be 90 for insurance purposes  Has the patient contacted their pharmacy? Yes.   (Agent: If no, request that the patient contact the pharmacy for the refill.) (Agent: If yes, when and what did the pharmacy advise?)  Preferred Pharmacy (with phone number or street name):  [facility] [place], [place]  [place] [address]  Phone: [phone] Fax: [phone]     Agent: Please be advised that RX refills may take up to 3 business days. We ask that you follow-up with your pharmacy.

## 2019-01-17 ENCOUNTER — Other Ambulatory Visit: Payer: Self-pay

## 2019-01-17 DIAGNOSIS — E032 Hypothyroidism due to medicaments and other exogenous substances: Secondary | ICD-10-CM

## 2019-01-17 MED ORDER — LEVOTHYROXINE SODIUM 137 MCG PO TABS: ORAL_TABLET | ORAL | 0 refills | Status: DC

## 2019-02-01 MED ORDER — LEVOTHYROXINE SODIUM 137 MCG PO TABS: ORAL_TABLET | ORAL | 2 refills | Status: DC

## 2019-02-01 NOTE — Telephone Encounter (Signed)
Patient is requesting a 90 day supply be sent to pharmacy. Patient has not picked up yet.  , Patient states she cannot pay for it unless it is 90 days. Call back [phone]

## 2019-02-01 NOTE — Telephone Encounter (Signed)
Message Routed to PCP CMA 

## 2019-02-01 NOTE — Telephone Encounter (Signed)
Rx corrected & re-sent into pharmacy for 120 tabs for 90 days.

## 2019-02-01 NOTE — Addendum Note (Signed)
Addended by: Rodrigo Ran on: 02/01/2019 09:22 AM   Modules accepted: Orders

## 2019-03-31 ENCOUNTER — Ambulatory Visit: Payer: 59 | Attending: Internal Medicine

## 2019-03-31 DIAGNOSIS — Z23 Encounter for immunization: Secondary | ICD-10-CM | POA: Insufficient documentation

## 2019-03-31 NOTE — Progress Notes (Signed)
   Covid-19 Vaccination Clinic  Name:  [name]    MRN: [id number] DOB: [birth year]  03/31/2019  [name] was observed post Covid-19 immunization for 15 minutes without incidence. She was provided with Vaccine Information Sheet and instruction to access the V-Safe system.   [name] was instructed to call 911 with any severe reactions post vaccine: Marland Kitchen Difficulty breathing  . Swelling of your face and throat  . A fast heartbeat  . A bad rash all over your body  . Dizziness and weakness    Immunizations Administered    Name Date Dose VIS Date Route   Pfizer COVID-19 Vaccine 03/31/2019  8:17 AM 0.3 mL 01/13/2019 Intramuscular   Manufacturer: [employer], [employer]   Lot: [id number]   NDC: [address]-8

## 2019-04-25 ENCOUNTER — Ambulatory Visit: Payer: 59 | Attending: Internal Medicine

## 2019-04-25 DIAGNOSIS — Z23 Encounter for immunization: Secondary | ICD-10-CM

## 2019-04-25 NOTE — Progress Notes (Signed)
   Covid-19 Vaccination Clinic  Name:  [name]    MRN: [id number] DOB: [birth year]  04/25/2019  [name] was observed post Covid-19 immunization for 15 minutes without incident. She was provided with Vaccine Information Sheet and instruction to access the V-Safe system.   [name] was instructed to call 911 with any severe reactions post vaccine: Marland Kitchen Difficulty breathing  . Swelling of face and throat  . A fast heartbeat  . A bad rash all over body  . Dizziness and weakness   Immunizations Administered    Name Date Dose VIS Date Route   Pfizer COVID-19 Vaccine 04/25/2019  8:32 AM 0.3 mL 01/13/2019 Intramuscular   Manufacturer: [employer], [employer]   Lot: [id number]   NDC: [address]-5

## 2019-07-17 ENCOUNTER — Other Ambulatory Visit: Payer: Self-pay | Admitting: Family Medicine

## 2019-07-17 DIAGNOSIS — Z1231 Encounter for screening mammogram for malignant neoplasm of breast: Secondary | ICD-10-CM

## 2019-07-25 ENCOUNTER — Ambulatory Visit
Admission: RE | Admit: 2019-07-25 | Discharge: 2019-07-25 | Disposition: A | Discharge status: Home / Self Care | Payer: 59 | Source: Ambulatory Visit | Attending: Family Medicine | Admitting: Family Medicine

## 2019-07-25 ENCOUNTER — Other Ambulatory Visit: Payer: Self-pay

## 2019-07-25 DIAGNOSIS — Z1231 Encounter for screening mammogram for malignant neoplasm of breast: Secondary | ICD-10-CM

## 2019-10-10 NOTE — Progress Notes (Signed)
54 y.o. G0P0000 Single Black or African American Not Hispanic or Latino female here for annual exam.  Never sexually active. Period Cycle (Days): 25 Period Duration (Days): 4-5 Period Pattern: Regular Menstrual Flow: Heavy, Light Menstrual Control: Maxi pad Menstrual Control Change Freq (Hours): 4 Dysmenorrhea: (!) Moderate Dysmenorrhea Symptoms: Cramping  She missed one cycle in May.  She notices an occasional vaginal discharge, no change. No itching, burning or irritation.   Her twin brother died in 02-11-2023 of pancreatic cancer. She has had a really rough time. Her brother was a Education officer, environmental.  She lost 2 siblings in 2018. She has one sibling left.  She has her faith. Doing better now.   Her mom is 92, lives on her own, still drives.   Patient's last menstrual period was 09/27/2019.          Sexually active: No.  The current method of family planning is none.    Exercising: Yes.    Cardio  Smoker:  no  Health Maintenance: Pap:07/14/18 WNL HR HPV Neg   02/25/16 WNL HR HPV Neg  History of abnormal Pap:  no MMG:  07/27/19 Density B Bi-rads 1 neg  BMD:   Never  Colonoscopy: 02/23/17 WNL, 5 year f/u  TDaP:  UTD per patient  Gardasil: na    reports that she has never smoked. She has never used smokeless tobacco. She reports that she does not drink alcohol and does not use drugs. She is a Recruitment consultant.   Past Medical History:  Diagnosis Date  . 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 Anemia    past hx   . Asthma    childhood  . Hypothyroidism   . IBS (irritable bowel syndrome)   . Multinodular goiter    s/p radioactive I131 ablation, Iatogenic Hypothyroidism  . Obesity   . Personal history of goiter    with radioactive iodine ablation    Past Surgical History:  Procedure Laterality Date  . BREAST SURGERY     biopsy, right  . WISDOM TOOTH EXTRACTION      Current Outpatient Medications  Medication Sig Dispense Refill  .  FERROUS SULFATE PO Take by mouth daily.     Marland Kitchen ibuprofen ([employer] IBUPROFEN) 200 MG tablet Take 200 mg by mouth every 6 (six) hours as needed.    Marland Kitchen levothyroxine (SYNTHROID) 137 MCG tablet TAKE 1 TABLET BY MOUTH TWICE Wed,thurs,and Friday. Once daily Mon,Thus,Sat,and Sun. 120 tablet 2  . MELATONIN GUMMIES PO Take by mouth as needed.    . meloxicam (MOBIC) 15 MG tablet TAKE ONE TABLET BY MOUTH ONCE DAILY AS NEEDED 30 tablet 3  . naproxen sodium (ALEVE) 220 MG tablet Take 220 mg by mouth as needed.     Current Facility-Administered Medications  Medication Dose Route Frequency Provider Last Rate Last Admin  . 0.9 %  sodium chloride infusion  500 mL Intravenous Once Rachael Fee, MD        Family History  Problem Relation Age of Onset  . Sarcoidosis Brother   . Colon cancer Brother   . Kidney failure Father   . Prostate cancer Father   . Hypertension Mother   . Diabetes Mother   . Heart disease Mother   . Heart disease Sister   . Pancreatic cancer Brother     Review of Systems  Genitourinary: Positive for vaginal discharge.  All other systems reviewed and are negative.   Exam:   BP  132/72   Pulse 62   Ht 5\' 6"  (1.676 m)   Wt 282 lb (127.9 kg)   LMP 09/27/2019   SpO2 100%   BMI 45.52 kg/m   Weight change: @WEIGHTCHANGE @ Height:   Height: 5\' 6"  (167.6 cm)  Ht Readings from Last 3 Encounters:  10/16/19 5\' 6"  (1.676 m)  10/03/18 5\' 7"  (1.702 m)  07/12/18 5\' 7"  (1.702 m)    General appearance: alert, cooperative and appears stated age Head: Normocephalic, without obvious abnormality, atraumatic Neck: no adenopathy, supple, symmetrical, trachea midline and thyroid normal to inspection and palpation Lungs: clear to auscultation bilaterally Cardiovascular: regular rate and rhythm Breasts: normal appearance, no masses or tenderness Abdomen: soft, non-tender; non distended,  no masses,  no organomegaly Extremities: extremities normal, atraumatic, no cyanosis or edema Skin: Skin  color, texture, turgor normal. No rashes or lesions Lymph nodes: Cervical, supraclavicular, and axillary nodes normal. No abnormal inguinal nodes palpated Neurologic: Grossly normal   Pelvic: External genitalia:  no lesions              Urethra:  normal appearing urethra with no masses, tenderness or lesions              Bartholins and Skenes: normal                 Vagina: normal appearing vagina with normal color and discharge, no lesions              Cervix: no lesions               Bimanual Exam:  Uterus:  no masses or tenderness              Adnexa: no mass, fullness, tenderness               Rectovaginal: Confirms               Anus:  normal sphincter tone, no lesions  10/18/19 chaperoned for the exam.  A:  Well Woman with normal exam  BMI 45, working on exercise  P:   No pap this year  Mammogram and colonoscopy UTD  Discussed breast self exam  Discussed calcium and vit D intake  Screening labs with primary

## 2019-10-16 ENCOUNTER — Telehealth: Payer: Self-pay | Admitting: Family Medicine

## 2019-10-16 ENCOUNTER — Ambulatory Visit (INDEPENDENT_AMBULATORY_CARE_PROVIDER_SITE_OTHER): Payer: 59 | Admitting: Obstetrics and Gynecology

## 2019-10-16 ENCOUNTER — Encounter: Payer: Self-pay | Admitting: Obstetrics and Gynecology

## 2019-10-16 ENCOUNTER — Other Ambulatory Visit: Payer: Self-pay

## 2019-10-16 VITALS — BP 132/72 | HR 62 | Ht 66.0 in | Wt 282.0 lb

## 2019-10-16 DIAGNOSIS — Z01419 Encounter for gynecological examination (general) (routine) without abnormal findings: Secondary | ICD-10-CM

## 2019-10-16 NOTE — Telephone Encounter (Signed)
Form received, on pcp's desk to be signed.

## 2019-10-16 NOTE — Telephone Encounter (Signed)
Pt dropped off form to be filled out. When completed, she would like it mailed to her at [address] [place] [address]  Placed in red folder

## 2019-10-16 NOTE — Patient Instructions (Signed)
EXERCISE AND DIET:  We recommended that you start or continue a regular exercise program for good health. Regular exercise means any activity that makes your heart beat faster and makes you sweat.  We recommend exercising at least 30 minutes per day at least 3 days a week, preferably 4 or 5.  We also recommend a diet low in fat and sugar.  Inactivity, poor dietary choices and obesity can cause diabetes, heart attack, stroke, and kidney damage, among others.    ALCOHOL AND SMOKING:  Women should limit their alcohol intake to no more than 7 drinks/beers/glasses of wine (combined, not each!) per week. Moderation of alcohol intake to this level decreases your risk of breast cancer and liver damage. And of course, no recreational drugs are part of a healthy lifestyle.  And absolutely no smoking or even second hand smoke. Most people know smoking can cause heart and lung diseases, but did you know it also contributes to weakening of your bones? Aging of your skin?  Yellowing of your teeth and nails?  CALCIUM AND VITAMIN D:  Adequate intake of calcium and Vitamin D are recommended.  The recommendations for exact amounts of these supplements seem to change often, but generally speaking 1,000 mg of calcium (between diet and supplement) and 800 units of Vitamin D per day seems prudent. Certain women may benefit from higher intake of Vitamin D.  If you are among these women, your doctor will have told you during your visit.    PAP SMEARS:  Pap smears, to check for cervical cancer or precancers,  have traditionally been done yearly, although recent scientific advances have shown that most women can have pap smears less often.  However, every woman still should have a physical exam from her gynecologist every year. It will include a breast check, inspection of the vulva and vagina to check for abnormal growths or skin changes, a visual exam of the cervix, and then an exam to evaluate the size and shape of the uterus and  ovaries.  And after 53 years of age, a rectal exam is indicated to check for rectal cancers. We will also provide age appropriate advice regarding health maintenance, like when you should have certain vaccines, screening for sexually transmitted diseases, bone density testing, colonoscopy, mammograms, etc.   MAMMOGRAMS:  All women over 40 years old should have a yearly mammogram. Many facilities now offer a "3D" mammogram, which may cost around $50 extra out of pocket. If possible,  we recommend you accept the option to have the 3D mammogram performed.  It both reduces the number of women who will be called back for extra views which then turn out to be normal, and it is better than the routine mammogram at detecting truly abnormal areas.    COLON CANCER SCREENING: Now recommend starting at age 45. At this time colonoscopy is not covered for routine screening until 50. There are take home tests that can be done between 45-49.   COLONOSCOPY:  Colonoscopy to screen for colon cancer is recommended for all women at age 50.  We know, you hate the idea of the prep.  We agree, BUT, having colon cancer and not knowing it is worse!!  Colon cancer so often starts as a polyp that can be seen and removed at colonscopy, which can quite literally save your life!  And if your first colonoscopy is normal and you have no family history of colon cancer, most women don't have to have it again for   10 years.  Once every ten years, you can do something that may end up saving your life, right?  We will be happy to help you get it scheduled when you are ready.  Be sure to check your insurance coverage so you understand how much it will cost.  It may be covered as a preventative service at no cost, but you should check your particular policy.      Breast Self-Awareness Breast self-awareness means being familiar with how your breasts look and feel. It involves checking your breasts regularly and reporting any changes to your  health care provider. Practicing breast self-awareness is important. A change in your breasts can be a sign of a serious medical problem. Being familiar with how your breasts look and feel allows you to find any problems early, when treatment is more likely to be successful. All women should practice breast self-awareness, including women who have had breast implants. How to do a breast self-exam One way to learn what is normal for your breasts and whether your breasts are changing is to do a breast self-exam. To do a breast self-exam: Look for Changes  1. Remove all the clothing above your waist. 2. Stand in front of a mirror in a room with good lighting. 3. Put your hands on your hips. 4. Push your hands firmly downward. 5. Compare your breasts in the mirror. Look for differences between them (asymmetry), such as: ? Differences in shape. ? Differences in size. ? Puckers, dips, and bumps in one breast and not the other. 6. Look at each breast for changes in your skin, such as: ? Redness. ? Scaly areas. 7. Look for changes in your nipples, such as: ? Discharge. ? Bleeding. ? Dimpling. ? Redness. ? A change in position. Feel for Changes Carefully feel your breasts for lumps and changes. It is best to do this while lying on your back on the floor and again while sitting or standing in the shower or tub with soapy water on your skin. Feel each breast in the following way:  Place the arm on the side of the breast you are examining above your head.  Feel your breast with the other hand.  Start in the nipple area and make  inch (2 cm) overlapping circles to feel your breast. Use the pads of your three middle fingers to do this. Apply light pressure, then medium pressure, then firm pressure. The light pressure will allow you to feel the tissue closest to the skin. The medium pressure will allow you to feel the tissue that is a little deeper. The firm pressure will allow you to feel the tissue  close to the ribs.  Continue the overlapping circles, moving downward over the breast until you feel your ribs below your breast.  Move one finger-width toward the center of the body. Continue to use the  inch (2 cm) overlapping circles to feel your breast as you move slowly up toward your collarbone.  Continue the up and down exam using all three pressures until you reach your armpit.  Write Down What You Find  Write down what is normal for each breast and any changes that you find. Keep a written record with breast changes or normal findings for each breast. By writing this information down, you do not need to depend only on memory for size, tenderness, or location. Write down where you are in your menstrual cycle, if you are still menstruating. If you are having trouble noticing differences   in your breasts, do not get discouraged. With time you will become more familiar with the variations in your breasts and more comfortable with the exam. How often should I examine my breasts? Examine your breasts every month. If you are breastfeeding, the best time to examine your breasts is after a feeding or after using a breast pump. If you menstruate, the best time to examine your breasts is 5-7 days after your period is over. During your period, your breasts are lumpier, and it may be more difficult to notice changes. When should I see my health care provider? See your health care provider if you notice:  A change in shape or size of your breasts or nipples.  A change in the skin of your breast or nipples, such as a reddened or scaly area.  Unusual discharge from your nipples.  A lump or thick area that was not there before.  Pain in your breasts.  Anything that concerns you.  

## 2019-10-21 ENCOUNTER — Other Ambulatory Visit: Payer: Self-pay | Admitting: Family Medicine

## 2019-10-21 DIAGNOSIS — E032 Hypothyroidism due to medicaments and other exogenous substances: Secondary | ICD-10-CM

## 2019-10-23 ENCOUNTER — Telehealth: Payer: Self-pay | Admitting: Family Medicine

## 2019-10-23 NOTE — Telephone Encounter (Signed)
I left pt a voicemail letting her know that the form will be signed once Dr. [place] sees her. I advised for her to call back to move the appt up if needed.

## 2019-10-23 NOTE — Telephone Encounter (Signed)
Pt said she is fine with an early appointment. Please call her back with one.  Please advise

## 2019-10-23 NOTE — Telephone Encounter (Signed)
error 

## 2019-10-23 NOTE — Telephone Encounter (Signed)
Appt had been made for 9/27.

## 2019-10-30 ENCOUNTER — Other Ambulatory Visit: Payer: Self-pay

## 2019-10-30 ENCOUNTER — Encounter: Payer: Self-pay | Admitting: Family Medicine

## 2019-10-30 ENCOUNTER — Ambulatory Visit (INDEPENDENT_AMBULATORY_CARE_PROVIDER_SITE_OTHER): Payer: 59 | Admitting: Family Medicine

## 2019-10-30 VITALS — BP 130/70 | HR 83 | Temp 98.3°F | Resp 16 | Ht 66.0 in | Wt 279.2 lb

## 2019-10-30 DIAGNOSIS — D509 Iron deficiency anemia, unspecified: Secondary | ICD-10-CM | POA: Diagnosis not present

## 2019-10-30 DIAGNOSIS — Z1159 Encounter for screening for other viral diseases: Secondary | ICD-10-CM | POA: Diagnosis not present

## 2019-10-30 DIAGNOSIS — Z130 Encounter for screening for diseases of the blood and blood-forming organs and certain disorders involving the immune mechanism: Secondary | ICD-10-CM

## 2019-10-30 DIAGNOSIS — Z13220 Encounter for screening for lipoid disorders: Secondary | ICD-10-CM

## 2019-10-30 DIAGNOSIS — Z23 Encounter for immunization: Secondary | ICD-10-CM | POA: Diagnosis not present

## 2019-10-30 DIAGNOSIS — Z0000 Encounter for general adult medical examination without abnormal findings: Secondary | ICD-10-CM

## 2019-10-30 DIAGNOSIS — E66813 Obesity, class 3: Secondary | ICD-10-CM

## 2019-10-30 DIAGNOSIS — Z13228 Encounter for screening for other metabolic disorders: Secondary | ICD-10-CM

## 2019-10-30 DIAGNOSIS — E032 Hypothyroidism due to medicaments and other exogenous substances: Secondary | ICD-10-CM | POA: Diagnosis not present

## 2019-10-30 DIAGNOSIS — Z1329 Encounter for screening for other suspected endocrine disorder: Secondary | ICD-10-CM

## 2019-10-30 NOTE — Assessment & Plan Note (Signed)
Continue iron supplementation. Further recommendations according to CBC and ferritin results.

## 2019-10-30 NOTE — Patient Instructions (Addendum)
Today you have you routine preventive visit. A few things to remember from today's visit:   Routine general medical examination at a health care facility  Encounter for HCV screening test for low risk patient - Plan: Hepatitis C antibody  Iron deficiency anemia, unspecified iron deficiency anemia type - Plan: CBC, Ferritin  Iatrogenic hypothyroidism - Plan: TSH  Screening for lipoid disorders - Plan: Lipid panel  Screening for endocrine, metabolic and immunity disorder - Plan: Hemoglobin A1c  If you need refills please call your pharmacy. Do not use My Chart to request refills or for acute issues that need immediate attention.   Please be sure medication list is accurate. If a new problem present, please set up appointment sooner than planned today.  At least 150 minutes of moderate exercise per week, daily brisk walking for 15-30 min is a good exercise option. Healthy diet low in saturated (animal) fats and sweets and consisting of fresh fruits and vegetables, lean meats such as fish and white chicken and whole grains.  These are some of recommendations for screening depending of age and risk factors:  - Vaccines:  Tdap vaccine every 10 years.  Shingles vaccine recommended at age 57, could be given after 53 years of age but not sure about insurance coverage.   Pneumonia vaccines: Pneumovax at 65. Sometimes Pneumovax is giving earlier if history of smoking, lung disease,diabetes,kidney disease among some.  Screening for diabetes at age 25 and every 3 years.  Cervical cancer prevention:  Pap smear starts at 53 years of age and continues periodically until 53 years old in low risk women. Pap smear every 3 years between 32 and 69 years old. Pap smear every 3-5 years between women 30 and older if pap smear negative and HPV screening negative.   -Breast cancer: Mammogram: There is disagreement between experts about when to start screening in low risk asymptomatic female but  recent recommendations are to start screening at 65 and not later than 53 years old , every 1-2 years and after 53 yo q 2 years. Screening is recommended until 53 years old but some women can continue screening depending of healthy issues.  Colon cancer screening: Has been recently changed to 53 yo. Insurance may not cover until you are 53 years old. Screening is recommended until 53 years old.  Cholesterol disorder screening at age 6 and every 3 years.  Also recommended:  1. Dental visit- Brush and floss your teeth twice daily; visit your dentist twice a year. 2. Eye doctor- Get an eye exam at least every 2 years. 3. Helmet use- Always wear a helmet when riding a bicycle, motorcycle, rollerblading or skateboarding. 4. Safe sex- If you may be exposed to sexually transmitted infections, use a condom. 5. Seat belts- Seat belts can save your live; always wear one. 6. Smoke/Carbon Monoxide detectors- These detectors need to be installed on the appropriate level of your home. Replace batteries at least once a year. 7. Skin cancer- When out in the sun please cover up and use sunscreen 15 SPF or higher. 8. Violence- If anyone is threatening or hurting you, please tell your healthcare provider.  9. Drink alcohol in moderation- Limit alcohol intake to one drink or less per day. Never drink and drive. 10. Calcium supplementation 1000 to 1200 mg daily, ideally through your diet.  Vitamin D supplementation 800 units daily.

## 2019-10-30 NOTE — Assessment & Plan Note (Addendum)
No changes in current management, will follow labs done today and will give further recommendations accordingly. Recommend using an alarm on her phone to remind her to take med.

## 2019-10-30 NOTE — Assessment & Plan Note (Signed)
We discussed benefits of wt loss as well as adverse effects of obesity. Consistency with healthy diet and physical activity recommended. Form completed and signed,she will fax it.

## 2019-10-30 NOTE — Progress Notes (Signed)
HPI: [name] is a 54 y.o. female, who is here today for her routine physical.  Last CPE: 02/25/16. No new problems since her last visit.  Regular exercise 3 or more time per week: Cardio started about 2 months ago, 15 min x 3 days. Following a healthy diet: Decreased sweets, increased water, no sodas. She is now cooking.  She lives alone.  Chronic medical problems: Iron deficiency anemia,hypothyroidism,OA, and seasonal allergies among some.  Pap smear: 07/12/18 negative. Follows with her gyn regularly.  Immunization History  Administered Date(s) Administered  . Influenza Split 01/20/2012  . Influenza Whole 11/07/2007, 10/29/2008, 12/17/2009  . Influenza,inj,Quad PF,6+ Mos 11/19/2015, 11/21/2018, 10/30/2019  . PFIZER SARS-COV-2 Vaccination 03/31/2019, 04/25/2019  . Td 12/09/2006  . Tdap 10/30/2019   Mammogram: 07/25/19 Bi-Rads 1 Colonoscopy: 02/23/17, 5 years f/u was recommended. DEXA: N/A  Hep C screening: Never.  She has no new concerns today.  Hypothyroidism: S/P radioactive I thyroid ablation. She is on Levothyroxine 137 mcg bid  x 3/week and 137 mcg x 4.She sometimes misses 1-2 doses per week.  Lab Results  Component Value Date   TSH 2.23 11/21/2018    She had an episode of severe left hip pain last week, she had to call EMS.Evaluated at home, did not have to go to the ER. Pain "is gone" now. Hx of hip OA.  Iron def anemia:  Lab Results  Component Value Date   WBC 4.1 06/24/2018   HGB 10.6 (L) 06/24/2018   HCT 32.9 (L) 06/24/2018   MCV 82 06/24/2018   PLT 299 06/24/2018  Menses are not as heavy now. She is on iron supplementation. Follows with gyn.  Review of Systems  Constitutional: Negative for appetite change, fatigue and fever.  HENT: Negative for hearing loss, mouth sores, sore throat and trouble swallowing.   Eyes: Negative for redness and visual disturbance.  Respiratory: Negative for cough, shortness of breath and wheezing.     Cardiovascular: Negative for chest pain, palpitations and leg swelling.  Gastrointestinal: Negative for abdominal pain, nausea and vomiting.       No changes in bowel habits.  Endocrine: Negative for cold intolerance, heat intolerance, polydipsia, polyphagia and polyuria.  Genitourinary: Negative for decreased urine volume, dysuria, hematuria, vaginal bleeding and vaginal discharge.  Musculoskeletal: Positive for arthralgias. Negative for gait problem and myalgias.  Skin: Negative for color change and rash.  Allergic/Immunologic: Positive for environmental allergies.  Neurological: Negative for syncope, weakness and headaches.  Hematological: Negative for adenopathy. Does not bruise/bleed easily.  Psychiatric/Behavioral: Negative for confusion. The patient is not nervous/anxious.   All other systems reviewed and are negative.  Current Outpatient Medications on File Prior to Visit  Medication Sig Dispense Refill  . FERROUS SULFATE PO Take by mouth daily.     Marland Kitchen ibuprofen ([facility] IBUPROFEN) 200 MG tablet Take 200 mg by mouth every 6 (six) hours as needed.    Marland Kitchen levothyroxine (SYNTHROID) 137 MCG tablet TAKE 1 TABLET BY MOUTH TWICE DAILY ON WEDNESDAY, THURSDAY AND FRIDAY, THEN ONCE DAILY ON MONDAY, THURSDAY, SATURDAY AND SUNDAY 120 tablet 2  . MELATONIN GUMMIES PO Take by mouth as needed.    . meloxicam (MOBIC) 15 MG tablet TAKE ONE TABLET BY MOUTH ONCE DAILY AS NEEDED 30 tablet 3  . naproxen sodium (ALEVE) 220 MG tablet Take 220 mg by mouth as needed.     No current facility-administered medications on file prior to visit.     Past Medical History:  Diagnosis Date  .  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 Anemia    past hx   . Asthma    childhood  . Hypothyroidism   . IBS (irritable bowel syndrome)   . Multinodular goiter    s/p radioactive I131 ablation, Iatogenic Hypothyroidism  . Obesity   . Personal history of goiter    with  radioactive iodine ablation    Past Surgical History:  Procedure Laterality Date  . BREAST SURGERY     biopsy, right  . WISDOM TOOTH EXTRACTION      Allergies  Allergen Reactions  . Sulfonamide Derivatives Hives    Family History  Problem Relation Age of Onset  . Sarcoidosis Brother   . Colon cancer Brother   . Kidney failure Father   . Prostate cancer Father   . Hypertension Mother   . Diabetes Mother   . Heart disease Mother   . Heart disease Sister   . Pancreatic cancer Brother     Social History   Socioeconomic History  . Marital status: Single    Spouse name: Not on file  . Number of children: 0  . Years of education: 64  . Highest education level: Not on file  Occupational History  . Occupation: CLINICAL CHEMIST    Employer: [employer]  Tobacco Use  . Smoking status: Never Smoker  . Smokeless tobacco: Never Used  Vaping Use  . Vaping Use: Never used  Substance and Sexual Activity  . Alcohol use: No  . Drug use: No  . Sexual activity: Not Currently    Birth control/protection: None  Other Topics Concern  . Not on file  Social History Narrative  . Not on file   Social Determinants of Health   Financial Resource Strain:   . Difficulty of Paying Living Expenses: Not on file  Food Insecurity:   . Worried About Programme researcher, broadcasting/film/video in the Last Year: Not on file  . Ran Out of Food in the Last Year: Not on file  Transportation Needs:   . Lack of Transportation (Medical): Not on file  . Lack of Transportation (Non-Medical): Not on file  Physical Activity:   . Days of Exercise per Week: Not on file  . Minutes of Exercise per Session: Not on file  Stress:   . Feeling of Stress : Not on file  Social Connections:   . Frequency of Communication with Friends and Family: Not on file  . Frequency of Social Gatherings with Friends and Family: Not on file  . Attends Religious Services: Not on file  . Active Member of Clubs or Organizations: Not on file  .  Attends Banker Meetings: Not on file  . Marital Status: Not on file    Vitals:   10/30/19 1427  BP: 130/70  Pulse: 83  Resp: 16  Temp: 98.3 F (36.8 C)  SpO2: 99%   Body mass index is 45.06 kg/m.  Wt Readings from Last 3 Encounters:  10/30/19 279 lb 3.2 oz (126.6 kg)  10/16/19 282 lb (127.9 kg)  10/20/18 286 lb (129.7 kg)   Physical Exam Vitals and nursing note reviewed.  Constitutional:      General: She is not in acute distress.    Appearance: She is well-developed.  HENT:     Head: Normocephalic and atraumatic.     Right Ear: Hearing, tympanic membrane, ear canal and external ear normal.     Left Ear: Hearing, tympanic membrane,  ear canal and external ear normal.     Mouth/Throat:     Mouth: Mucous membranes are moist.     Pharynx: Oropharynx is clear. Uvula midline.  Eyes:     Extraocular Movements: Extraocular movements intact.     Conjunctiva/sclera: Conjunctivae normal.     Pupils: Pupils are equal, round, and reactive to light.  Neck:     Thyroid: No thyromegaly.     Trachea: No tracheal deviation.  Cardiovascular:     Rate and Rhythm: Normal rate and regular rhythm.     Pulses:          Dorsalis pedis pulses are 2+ on the right side and 2+ on the left side.     Heart sounds: No murmur heard.      Comments: 1+ pitting LE edema,bilateral. Pulmonary:     Effort: Pulmonary effort is normal. No respiratory distress.     Breath sounds: Normal breath sounds.  Abdominal:     Palpations: Abdomen is soft. There is no hepatomegaly or mass.     Tenderness: There is no abdominal tenderness.  Genitourinary:    Comments: Deferred to gyn. Musculoskeletal:     Comments: No major deformity or signs of synovitis appreciated. Antalgic gait.  Lymphadenopathy:     Cervical: No cervical adenopathy.     Upper Body:     Right upper body: No supraclavicular adenopathy.     Left upper body: No supraclavicular adenopathy.  Skin:    General: Skin is warm.      Findings: No erythema or rash.  Neurological:     Mental Status: She is alert and oriented to person, place, and time.     Cranial Nerves: No cranial nerve deficit.     Coordination: Coordination normal.     Deep Tendon Reflexes:     Reflex Scores:      Bicep reflexes are 2+ on the right side and 2+ on the left side.      Patellar reflexes are 2+ on the right side and 2+ on the left side. Psychiatric:        Mood and Affect: Mood and affect normal.        Speech: Speech normal.     Comments: Well groomed, good eye contact.   ASSESSMENT AND PLAN:  [name] was here today annual physical examination.  Orders Placed This Encounter  Procedures  . Tdap vaccine greater than or equal to 7yo IM  . Flu Vaccine QUAD 36+ mos IM  . Ferritin  . CBC  . Hepatitis C antibody  . Lipid panel  . Hemoglobin A1c  . TSH   Lab Results  Component Value Date   WBC 5.0 10/30/2019   HGB 11.2 10/30/2019   HCT 36.4 10/30/2019   MCV 86 10/30/2019   PLT 304 10/30/2019   Lab Results  Component Value Date   TSH 5.670 (H) 10/30/2019   Lab Results  Component Value Date   HGBA1C 5.3 10/30/2019   Lab Results  Component Value Date   CHOL 206 (H) 10/30/2019   HDL 57 10/30/2019   LDLCALC 139 (H) 10/30/2019   TRIG 57 10/30/2019   CHOLHDL 3.6 10/30/2019   Routine general medical examination at a health care facility We discussed the importance of regular physical activity and healthy diet for prevention of chronic illness and/or complications. Preventive guidelines reviewed. Vaccination updated today. Female preventive care with her gyn. Ca++ and vit D supplementation recommended. Next CPE in a year.  The 10-year ASCVD risk score Denman George DC Montez Hageman., et al., 2013) is: 2.5%   Values used to calculate the score:     Age: 73 years     Sex: Female     Is Non-Hispanic African American: Yes     Diabetic: No     Tobacco smoker: No     Systolic Blood Pressure: 130 mmHg     Is BP treated:  No     HDL Cholesterol: 57 mg/dL     Total Cholesterol: 206 mg/dL  Encounter for HCV screening test for low risk patient -     Hepatitis C antibody  Screening for lipoid disorders -     Lipid panel  Screening for endocrine, metabolic and immunity disorder -     Hemoglobin A1c  Need for influenza vaccination -     Flu Vaccine QUAD 36+ mos IM  Need for Tdap vaccination -     Tdap vaccine greater than or equal to 7yo IM  Obesity, Class III, BMI 40-49.9 (morbid obesity) (HCC) We discussed benefits of wt loss as well as adverse effects of obesity. Consistency with healthy diet and physical activity recommended. Form completed and signed,she will fax it.  Iatrogenic hypothyroidism No changes in current management, will follow labs done today and will give further recommendations accordingly. Recommend using an alarm on her phone to remind her to take med.  Anemia, iron deficiency Continue iron supplementation. Further recommendations according to CBC and ferritin results.   Return in 1 year (on 10/29/2020).   Clovis Mankins G. [place], MD  [facility] [place]. [place] [facility].   Today you have you routine preventive visit. A few things to remember from today's visit:   Routine general medical examination at a health care facility  Encounter for HCV screening test for low risk patient - Plan: Hepatitis C antibody  Iron deficiency anemia, unspecified iron deficiency anemia type - Plan: CBC, Ferritin  Iatrogenic hypothyroidism - Plan: TSH  Screening for lipoid disorders - Plan: Lipid panel  Screening for endocrine, metabolic and immunity disorder - Plan: Hemoglobin A1c  If you need refills please call your pharmacy. Do not use My Chart to request refills or for acute issues that need immediate attention.   Please be sure medication list is accurate. If a new problem present, please set up appointment sooner than planned today.  At least 150 minutes of moderate exercise  per week, daily brisk walking for 15-30 min is a good exercise option. Healthy diet low in saturated (animal) fats and sweets and consisting of fresh fruits and vegetables, lean meats such as fish and white chicken and whole grains.  These are some of recommendations for screening depending of age and risk factors:  - Vaccines:  Tdap vaccine every 10 years.  Shingles vaccine recommended at age 41, could be given after 53 years of age but not sure about insurance coverage.   Pneumonia vaccines: Pneumovax at 65. Sometimes Pneumovax is giving earlier if history of smoking, lung disease,diabetes,kidney disease among some.  Screening for diabetes at age 26 and every 3 years.  Cervical cancer prevention:  Pap smear starts at 53 years of age and continues periodically until 53 years old in low risk women. Pap smear every 3 years between 31 and 11 years old. Pap smear every 3-5 years between women 30 and older if pap smear negative and HPV screening negative.   -Breast cancer: Mammogram: There is disagreement between experts about when to start screening in low risk  asymptomatic female but recent recommendations are to start screening at 80 and not later than 53 years old , every 1-2 years and after 53 yo q 2 years. Screening is recommended until 53 years old but some women can continue screening depending of healthy issues.  Colon cancer screening: Has been recently changed to 53 yo. Insurance may not cover until you are 53 years old. Screening is recommended until 53 years old.  Cholesterol disorder screening at age 4 and every 3 years.  Also recommended:  1. Dental visit- Brush and floss your teeth twice daily; visit your dentist twice a year. 2. Eye doctor- Get an eye exam at least every 2 years. 3. Helmet use- Always wear a helmet when riding a bicycle, motorcycle, rollerblading or skateboarding. 4. Safe sex- If you may be exposed to sexually transmitted infections, use a  condom. 5. Seat belts- Seat belts can save your live; always wear one. 6. Smoke/Carbon Monoxide detectors- These detectors need to be installed on the appropriate level of your home. Replace batteries at least once a year. 7. Skin cancer- When out in the sun please cover up and use sunscreen 15 SPF or higher. 8. Violence- If anyone is threatening or hurting you, please tell your healthcare provider.  9. Drink alcohol in moderation- Limit alcohol intake to one drink or less per day. Never drink and drive. 10. Calcium supplementation 1000 to 1200 mg daily, ideally through your diet.  Vitamin D supplementation 800 units daily.

## 2019-10-31 LAB — CBC
Hematocrit: 36.4 % (ref 34.0–46.6)
Hemoglobin: 11.2 g/dL (ref 11.1–15.9)
MCH: 26.5 pg — ABNORMAL LOW (ref 26.6–33.0)
MCHC: 30.8 g/dL — ABNORMAL LOW (ref 31.5–35.7)
MCV: 86 fL (ref 79–97)
Platelets: 304 10*3/uL (ref 150–450)
RBC: 4.22 x10E6/uL (ref 3.77–5.28)
RDW: 14.1 % (ref 11.7–15.4)
WBC: 5 10*3/uL (ref 3.4–10.8)

## 2019-10-31 LAB — LIPID PANEL
Chol/HDL Ratio: 3.6 ratio (ref 0.0–4.4)
Cholesterol, Total: 206 mg/dL — ABNORMAL HIGH (ref 100–199)
HDL: 57 mg/dL (ref >39)
LDL Chol Calc (NIH): 139 mg/dL — ABNORMAL HIGH (ref 0–99)
Triglycerides: 57 mg/dL (ref 0–149)
VLDL Cholesterol Cal: 10 mg/dL (ref 5–40)

## 2019-10-31 LAB — FERRITIN: Ferritin: 14 ng/mL — ABNORMAL LOW (ref 15–150)

## 2019-10-31 LAB — HEMOGLOBIN A1C
Est. average glucose Bld gHb Est-mCnc: 105 mg/dL
Hgb A1c MFr Bld: 5.3 % (ref 4.8–5.6)

## 2019-10-31 LAB — HEPATITIS C ANTIBODY: Hep C Virus Ab: 0.1 s/co ratio (ref 0.0–0.9)

## 2019-10-31 LAB — TSH: TSH: 5.67 u[IU]/mL — ABNORMAL HIGH (ref 0.450–4.500)

## 2019-11-21 ENCOUNTER — Ambulatory Visit: Payer: 59 | Admitting: Family Medicine

## 2020-02-06 ENCOUNTER — Emergency Department (HOSPITAL_COMMUNITY): Payer: 59

## 2020-02-06 ENCOUNTER — Encounter (HOSPITAL_COMMUNITY): Payer: Self-pay | Admitting: Emergency Medicine

## 2020-02-06 ENCOUNTER — Emergency Department (HOSPITAL_COMMUNITY)
Admission: EM | Admit: 2020-02-06 | Discharge: 2020-02-06 | Disposition: A | Discharge status: Home / Self Care | Payer: 59 | Attending: Emergency Medicine | Admitting: Emergency Medicine

## 2020-02-06 DIAGNOSIS — S82392A Other fracture of lower end of left tibia, initial encounter for closed fracture: Secondary | ICD-10-CM | POA: Diagnosis not present

## 2020-02-06 DIAGNOSIS — S82832A Other fracture of upper and lower end of left fibula, initial encounter for closed fracture: Secondary | ICD-10-CM

## 2020-02-06 DIAGNOSIS — E039 Hypothyroidism, unspecified: Secondary | ICD-10-CM | POA: Diagnosis not present

## 2020-02-06 DIAGNOSIS — Y9301 Activity, walking, marching and hiking: Secondary | ICD-10-CM | POA: Diagnosis not present

## 2020-02-06 DIAGNOSIS — S8992XA Unspecified injury of left lower leg, initial encounter: Secondary | ICD-10-CM | POA: Diagnosis present

## 2020-02-06 DIAGNOSIS — Z79899 Other long term (current) drug therapy: Secondary | ICD-10-CM | POA: Insufficient documentation

## 2020-02-06 DIAGNOSIS — J45909 Unspecified asthma, uncomplicated: Secondary | ICD-10-CM | POA: Diagnosis not present

## 2020-02-06 DIAGNOSIS — W1841XA Slipping, tripping and stumbling without falling due to stepping on object, initial encounter: Secondary | ICD-10-CM | POA: Diagnosis not present

## 2020-02-06 DIAGNOSIS — W19XXXA Unspecified fall, initial encounter: Secondary | ICD-10-CM

## 2020-02-06 MED ORDER — HYDROCODONE-ACETAMINOPHEN 5-325 MG PO TABS: 1.0000 | ORAL_TABLET | Freq: Four times a day (QID) | ORAL | 0 refills | Status: DC | PRN

## 2020-02-06 MED ORDER — HYDROCODONE-ACETAMINOPHEN 5-325 MG PO TABS
1.0000 | ORAL_TABLET | Freq: Once | ORAL | Status: AC
Start: 2020-02-06 — End: 2020-02-06
  Administered 2020-02-06: 1 via ORAL
  Filled 2020-02-06: qty 1

## 2020-02-06 NOTE — ED Triage Notes (Signed)
Per EMS-fell attempting to get in car twisting left leg-left knee and ankle swollen-no obvious deformity

## 2020-02-06 NOTE — Discharge Instructions (Addendum)
Please read instructions below. Keep the splint clean, dry and in place at all times. Elevate your leg as much as possible. You can take ibuprofen every 6 hours as needed for pain. You can take hydrocodone every 6 hours as needed for more severe pain.  Be aware this medication can make you drowsy. Do not operate motor vehicles or drink alcohol while taking this medication.  Call the orthopedic specialist office the next business day to schedule an appointment for repeat xray and follow up on your injury. Return to the ED for concerning symptoms.

## 2020-02-06 NOTE — ED Provider Notes (Signed)
[facility]-EMERGENCY DEPT Provider Note   CSN: [id number] Arrival date & time: 02/06/20  9417     History Chief Complaint  Patient presents with  . Fall    [name] is a 55 y.o. female presenting to the emergency department with complaint of left knee and ankle injury that occurred prior to arrival.  She states she was walking to her car to go to work when she slipped on the ice.  Her left knee bent under her with her right leg extended.  She has most of her pain to her left ankle with weightbearing, as well as pain to the anterior medial aspect of the left knee.  She reports remote history of ligament injury in the left knee.  She did not hit her head or lose consciousness.  She has no other injuries reported.  She is not on anticoagulation.  No medications taken prior to arrival, transported via EMS because she could not get up from the ground after her fall.  The history is provided by the patient.       Past Medical History:  Diagnosis Date  . 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 Anemia    past hx   . Asthma    childhood  . Hypothyroidism   . IBS (irritable bowel syndrome)   . Multinodular goiter    s/p radioactive I131 ablation, Iatogenic Hypothyroidism  . Obesity   . Personal history of goiter    with radioactive iodine ablation    Patient Active Problem List   Diagnosis Date Noted  . Arthralgia of multiple sites, bilateral 08/13/2015  . Anemia, iron deficiency 08/13/2015  . Hypothyroidism 05/09/2015  . Heel spur 05/07/2015  . Achilles tendinitis of left lower extremity 05/07/2015  . Family history of coronary artery disease 04/09/2014  . Somnolence 06/02/2013  . Obesity, Class III, BMI 40-49.9 (morbid obesity) (HCC) 06/02/2013  . Disturbance of skin sensation 03/23/2012  . Peripheral neuropathy 02/23/2012  . Anemia 02/23/2012  . Paraesthesia of skin 06/23/2010  . LEG PAIN, RIGHT  03/13/2010  . BACK PAIN, THORACIC REGION, RIGHT 03/13/2008  . Iatrogenic hypothyroidism 11/02/2006  . ASTHMA 11/02/2006  . IRRITABLE BOWEL SYNDROME 11/02/2006  . BREAST BIOPSY, HX OF 11/02/2006    Past Surgical History:  Procedure Laterality Date  . BREAST SURGERY     biopsy, right  . WISDOM TOOTH EXTRACTION       OB History    Gravida  0   Para  0   Term  0   Preterm  0   AB  0   Living  0     SAB  0   IAB  0   Ectopic  0   Multiple  0   Live Births  0           Family History  Problem Relation Age of Onset  . Sarcoidosis Brother   . Colon cancer Brother   . Kidney failure Father   . Prostate cancer Father   . Hypertension Mother   . Diabetes Mother   . Heart disease Mother   . Heart disease Sister   . Pancreatic cancer Brother     Social History   Tobacco Use  . Smoking status: Never Smoker  . Smokeless tobacco: Never Used  Vaping Use  . Vaping Use: Never used  Substance Use Topics  . Alcohol use: No  . Drug use: No  Home Medications Prior to Admission medications   Medication Sig Start Date End Date Taking? Authorizing Provider  acetaminophen (TYLENOL) 500 MG tablet Take 500 mg by mouth every 6 (six) hours as needed for mild pain, fever or headache.   Yes [provider]  HYDROcodone-acetaminophen (NORCO/VICODIN) 5-325 MG tablet Take 1-2 tablets by mouth every 6 (six) hours as needed for severe pain. 02/06/20  Yes Farris Blash, [place] N, PA-C  ibuprofen (ADVIL) 200 MG tablet Take 200 mg by mouth every 6 (six) hours as needed for fever, headache or mild pain.   Yes [provider]  levothyroxine (SYNTHROID) 137 MCG tablet TAKE 1 TABLET BY MOUTH TWICE DAILY ON WEDNESDAY, THURSDAY AND FRIDAY, THEN ONCE DAILY ON MONDAY, THURSDAY, SATURDAY AND SUNDAY Patient taking differently: Take 137 mcg by mouth See admin instructions. TAKE 137mg  BY MOUTH TWICE DAILY ON WEDNESDAY, THURSDAY AND FRIDAY, THEN ONCE DAILY ON MONDAY, THURSDAY,  SATURDAY AND SUNDAY 10/23/19  Yes 10/25/19, Betty G, MD  naproxen sodium (ALEVE) 220 MG tablet Take 220 mg by mouth as needed (pain/headache).   Yes [provider]  VITAMIN D PO Take 1 capsule by mouth daily.   Yes [provider]  VITAMIN E PO Take 1 tablet by mouth daily.   Yes [provider]    Allergies    Sulfonamide derivatives  Review of Systems   Review of Systems  Musculoskeletal: Positive for arthralgias.  Skin: Negative for wound.  Neurological: Negative for numbness.  All other systems reviewed and are negative.   Physical Exam Updated Vital Signs BP (!) 185/83   Pulse 91   Temp 98.1 F (36.7 C) (Oral)   Resp 17   SpO2 100%   Physical Exam Vitals and nursing note reviewed.  Constitutional:      General: She is not in acute distress.    Appearance: She is well-developed and well-nourished.  HENT:     Head: Normocephalic and atraumatic.  Eyes:     Conjunctiva/sclera: Conjunctivae normal.  Cardiovascular:     Rate and Rhythm: Normal rate.  Pulmonary:     Effort: Pulmonary effort is normal.  Abdominal:     Palpations: Abdomen is soft.  Musculoskeletal:     Comments: Left ankle with swelling present, no deformity or wounds. TTP to lateral aspect. Left knee with TTP to anterior and medial aspect. No deformity, mild swelling. Compartments are soft Strong DP pulse. Normal distal sensation  Skin:    General: Skin is warm.  Neurological:     Mental Status: She is alert.  Psychiatric:        Mood and Affect: Mood and affect normal.        Behavior: Behavior normal.     ED Results / Procedures / Treatments   Labs (all labs ordered are listed, but only abnormal results are displayed) Labs Reviewed - No data to display  EKG None  Radiology DG Ankle Complete Left  Result Date: 02/06/2020 CLINICAL DATA:  Pain following fall EXAM: LEFT ANKLE COMPLETE - 3+ VIEW COMPARISON:  None. FINDINGS: Frontal, oblique, and lateral views were  obtained. There is a spiral fracture of the distal fibular diaphysis with alignment near anatomic in this area. There is soft tissue swelling laterally. No appreciable joint effusion. There is narrowing medially in the ankle joint region as well as spurring in the dorsal midfoot region. There are posterior and inferior calcaneal spurs. Ankle mortise appears intact. IMPRESSION: Spiral fracture distal fibula with near anatomic alignment at the site  of fracture. Soft tissue swelling in this area. Areas of osteoarthritic change noted. Ankle mortise appears intact. Calcaneal spurs noted posteriorly and inferiorly. Electronically Signed   By: Lowella Grip III M.D.   On: 02/06/2020 07:59   CT Knee Left Wo Contrast  Result Date: 02/06/2020 CLINICAL DATA:  Left knee pain after twisting injury EXAM: CT OF THE LEFT KNEE WITHOUT CONTRAST TECHNIQUE: Multidetector CT imaging of the left knee was performed according to the standard protocol. Multiplanar CT image reconstructions were also generated. COMPARISON:  X-ray 02/06/2020 FINDINGS: Bones/Joint/Cartilage No acute fracture. No dislocation. Moderate to severe tricompartmental osteoarthritis of the left knee manifested by joint space narrowing, subchondral sclerosis/cystic change, and large marginal osteophyte formation. Findings are most severe within the medial compartment. Small to moderate-sized knee joint effusion without fat-fluid or hematocrit level. Small Baker's cyst. Ligaments Suboptimally assessed by CT. Muscles and Tendons No acute musculotendinous injury by CT. Soft tissues Small volume prepatellar fluid. Mild subcutaneous edema most pronounced anteriorly. IMPRESSION: 1. No acute osseous abnormality of the left knee. 2. Moderate-to-severe tricompartmental osteoarthritis of the left knee, most severe within the medial compartment. 3. Small to moderate-sized knee joint effusion without evidence of lipohemarthrosis. Small Baker's cyst. 4. Small volume  prepatellar fluid, which could reflect a mild bursitis. Electronically Signed   By: Davina Poke D.O.   On: 02/06/2020 14:01   DG Knee Complete 4 Views Left  Result Date: 02/06/2020 CLINICAL DATA:  Fall.  Pain. EXAM: LEFT KNEE - COMPLETE 4+ VIEW COMPARISON:  No recent. FINDINGS: Diffuse osteopenia and tricompartment degenerative change. Very subtle nondisplaced fracture of the medial most aspect of the medial tibial plateau cannot be completely excluded. No evidence of dislocation. Knee joint effusion most likely present. IMPRESSION: Diffuse osteopenia and tricompartment degenerative change. Very subtle nondisplaced fracture of the medial most aspect of the medial tibial plateau cannot be completely excluded. Knee joint effusion most likely present. Electronically Signed   By: Marcello Moores  Register   On: 02/06/2020 08:00    Procedures Procedures (including critical care time)  Medications Ordered in ED Medications  HYDROcodone-acetaminophen (NORCO/VICODIN) 5-325 MG per tablet 1 tablet (1 tablet Oral Given 02/06/20 1254)    ED Course  I have reviewed the triage vital signs and the nursing notes.  Pertinent labs & imaging results that were available during my care of the patient were reviewed by me and considered in my medical decision making (see chart for details).    MDM Rules/Calculators/A&P                          Patient presenting with left ankle and knee injury after mechanical fall this morning.  Patient slipped on the ice.  No head trauma or LOC, not on anticoagulation.  X-rays obtained in triage reveal nondisplaced spiral fracture of the distal fibula with intact ankle mortise.  Also questionable medial tibial plateau fracture.  Patient does have point tenderness in this area, this is followed by CT scan for further evaluation.  Pain is treated, minimal at rest.  Neurovascularly intact.  CT of the knee is negative for acute fracture.  Left ankle is placed in cam boot for  immobilization, crutches provided for nonweightbearing.  She is instructed of symptomatic management including RICE therapy, NSAIDs.  Will provide prescription for pain, and orthopedic referral for follow-up.  Patient is discharged in no distress.  Discussed results, findings, treatment and follow up. Patient advised of return precautions. Patient verbalized understanding and agreed  with plan.  [place] Controlled Substance reporting System queried  Final Clinical Impression(s) / ED Diagnoses Final diagnoses:  Fall  Other closed fracture of distal end of left fibula, initial encounter    Rx / DC Orders ED Discharge Orders         Ordered    HYDROcodone-acetaminophen (NORCO/VICODIN) 5-325 MG tablet  Every 6 hours PRN        02/06/20 1441           Chastin Garlitz, [place] N, [place] 02/06/20 1459    Tegeler, Canary Brim, MD 02/06/20 1600

## 2020-02-06 NOTE — Progress Notes (Signed)
Orthopedic Tech Progress Note Patient Details:  [name] 1965/05/28 [id number]  Ortho Devices Ortho Device/Splint Location: applied cam walker to LLE and crutches Ortho Device/Splint Interventions: Application,Adjustment   Post Interventions Patient Tolerated: Well   Jennye Moccasin 02/06/2020, 2:46 PM

## 2020-02-06 NOTE — ED Notes (Signed)
Pt walked to restroom 

## 2020-02-08 ENCOUNTER — Telehealth (INDEPENDENT_AMBULATORY_CARE_PROVIDER_SITE_OTHER): Payer: 59 | Admitting: Family Medicine

## 2020-02-08 ENCOUNTER — Telehealth: Payer: 59 | Admitting: Family Medicine

## 2020-02-08 DIAGNOSIS — S82839A Other fracture of upper and lower end of unspecified fibula, initial encounter for closed fracture: Secondary | ICD-10-CM

## 2020-02-08 NOTE — Progress Notes (Signed)
Virtual Visit via Telephone Note  I connected with [name] on 02/08/20 at  5:40 PM EST by telephone and verified that I am speaking with the correct person using two identifiers.   I discussed the limitations, risks, security and privacy concerns of performing an evaluation and management service by telephone and the availability of in person appointments. I also discussed with the patient that there may be a patient responsible charge related to this service. The patient expressed understanding and agreed to proceed.  Location patient: home, [place] Location provider: work or home office Participants present for the call: patient, provider Patient did not have a visit with me in the prior 7 days to address this/these issue(s).   History of Present Illness:  Acute telemedicine visit for L ankle pain: -Onset:after fall on ice 2 days ago -went to urgent care and was dx with fx distal fibula and was put in boot with crutches and was referred to an orthopedic office. However, reports ortho told her they are full and can't see her anytime soon.  -wants to see ortho before going to work monday -denies worsening swelling, worsening pain -pain is currently controlled with otc anlagesics and has narcotic analgesic from [facility]   Observations/Objective: Patient sounds cheerful and well on the phone. I do not appreciate any SOB. Speech and thought processing are grossly intact. Patient reported vitals:  Assessment and Plan:  Closed fracture of distal end of fibula, unspecified fracture morphology, unspecified laterality, initial encounter  -Patient is requesting orthopedic evaluation, as this was recommended at that urgent care/emergency room that she went to.  She has contacted the orthopedic office she was referred to, however they are unable to see her as they are booked.  She really wants to see someone promptly before returning to work.  Discussed available orthopedic urgent care options and  other options for referral.  She prefers to try orthopedic urgent care.  Currently pain is controlled and she denies any worsening of symptoms.  Has a boot and crutches.  Advised to seek prompt in person care if worsening, new symptoms arise, or if is not improving with treatment. Advised of options for inperson care in case PCP office not available. Did let the patient know that I only do telemedicine shifts for  on Tuesdays and Thursdays and advised a follow up visit with PCP or at an [facility] if has further questions or concerns.   Follow Up Instructions:  I did not refer this patient for an OV with me in the next 24 hours for this/these issue(s).  I discussed the assessment and treatment plan with the patient. The patient was provided an opportunity to ask questions and all were answered. The patient agreed with the plan and demonstrated an understanding of the instructions.   I spent 14 minutes on the date of this visit in the care of this patient. See summary of tasks completed to properly care for this patient in the detailed notes above which often include previsit review of recent office visit notes, review of PMH, medications, allergies, evaluation of the patient and ordering and instructing patient on testing and care options.     Terressa Koyanagi, DO

## 2020-07-12 ENCOUNTER — Encounter (HOSPITAL_BASED_OUTPATIENT_CLINIC_OR_DEPARTMENT_OTHER): Payer: 59 | Attending: Internal Medicine | Admitting: Internal Medicine

## 2020-07-12 ENCOUNTER — Other Ambulatory Visit: Payer: Self-pay

## 2020-07-12 DIAGNOSIS — W000XXD Fall on same level due to ice and snow, subsequent encounter: Secondary | ICD-10-CM | POA: Insufficient documentation

## 2020-07-12 DIAGNOSIS — T8131XD Disruption of external operation (surgical) wound, not elsewhere classified, subsequent encounter: Secondary | ICD-10-CM | POA: Diagnosis not present

## 2020-07-12 DIAGNOSIS — L97828 Non-pressure chronic ulcer of other part of left lower leg with other specified severity: Secondary | ICD-10-CM | POA: Diagnosis present

## 2020-07-12 NOTE — Progress Notes (Signed)
LYNNOX, GIRTEN ([id number]) Visit Report for 07/12/2020 Abuse/Suicide Risk Screen Details Patient Name: Date of Service: [name], [name] 07/12/2020 2:45 PM Medical Record Number: [id number] Patient Account Number: [id number] Date of Birth/Sex: Treating RN: Jul 31, 1965 (55 y.o. Female) Shawn Stall Primary Care Arly Salminen: [place], Betty Other Clinician: Referring Eh Sauseda: Treating Lucretia Pendley/Extender: Robson, Michael [place], Betty Weeks in Treatment: 0 Abuse/Suicide Risk Screen Items Answer ABUSE RISK SCREEN: Has anyone close to you tried to hurt or harm you recentlyo No Do you feel uncomfortable with anyone in your familyo No Has anyone forced you do things that you didnt want to doo No Electronic Signature(s) Signed: 07/12/2020 6:16:40 PM By: Shawn Stall Entered By: Shawn Stall on 07/12/2020 14:57:08 -------------------------------------------------------------------------------- Activities of Daily Living Details Patient Name: Date of Service: [name], [name] 07/12/2020 2:45 PM Medical Record Number: [id number] Patient Account Number: [id number] Date of Birth/Sex: Treating RN: 1966-01-14 (55 y.o. Female) Shawn Stall Primary Care Malacai Grantz: [place], Betty Other Clinician: Referring Gidget Quizhpi: Treating Vonnie Spagnolo/Extender: Robson, Michael [place], Betty Weeks in Treatment: 0 Activities of Daily Living Items Answer Activities of Daily Living (Please select one for each item) Drive Automobile Completely Able T Medications ake Completely Able Use T elephone Completely Able Care for Appearance Completely Able Use T oilet Completely Able Bath / Shower Completely Able Dress Self Completely Able Feed Self Completely Able Walk Completely Able Get In / Out Bed Completely Able Housework Completely Able Prepare Meals Completely Able Handle Money Completely Able Shop for Self Completely Able Electronic Signature(s) Signed: 07/12/2020 6:16:40 PM By: Shawn Stall Entered By:  Shawn Stall on 07/12/2020 14:57:30 -------------------------------------------------------------------------------- Education Screening Details Patient Name: Date of Service: [name], [name] 07/12/2020 2:45 PM Medical Record Number: [id number] Patient Account Number: [id number] Date of Birth/Sex: Treating RN: 08-02-1965 (55 y.o. Female) Shawn Stall Primary Care Kaytlynne Neace: [place], Betty Other Clinician: Referring Morgen Ritacco: Treating Myan Locatelli/Extender: Robson, Michael [place], Betty Weeks in Treatment: 0 Primary Learner Assessed: Patient Learning Preferences/Education Level/Primary Language Learning Preference: Explanation, Demonstration, Printed Material Highest Education Level: College or Above Preferred Language: English Cognitive Barrier Language Barrier: No Translator Needed: No Memory Deficit: No Emotional Barrier: No Cultural/Religious Beliefs Affecting Medical Care: No Physical Barrier Impaired Vision: Yes Glasses, reading Impaired Hearing: No Decreased Hand dexterity: No Knowledge/Comprehension Knowledge Level: High Comprehension Level: High Ability to understand written instructions: High Ability to understand verbal instructions: High Motivation Anxiety Level: Calm Cooperation: Cooperative Education Importance: Acknowledges Need Interest in Health Problems: Asks Questions Perception: Coherent Willingness to Engage in Self-Management High Activities: Readiness to Engage in Self-Management High Activities: Electronic Signature(s) Signed: 07/12/2020 6:16:40 PM By: Shawn Stall Entered By: Shawn Stall on 07/12/2020 14:58:02 -------------------------------------------------------------------------------- Fall Risk Assessment Details Patient Name: Date of Service: [name], [name] 07/12/2020 2:45 PM Medical Record Number: [id number] Patient Account Number: [id number] Date of Birth/Sex: Treating RN: 12-21-1965 (55 y.o. Female) Shawn Stall Primary Care  Cleta Heatley: [place], Betty Other Clinician: Referring Urian Martenson: Treating Daphyne Miguez/Extender: Robson, Michael [place], Betty Weeks in Treatment: 0 Fall Risk Assessment Items Have you had 2 or more falls in the last 12 monthso 0 No Have you had any fall that resulted in injury in the last 12 monthso 0 Yes FALLS RISK SCREEN History of falling - immediate or within 3 months 0 No Secondary diagnosis (Do you have 2 or more medical diagnoseso) 0 No Ambulatory aid None/bed rest/wheelchair/nurse 0 Yes Crutches/cane/walker 0 No Furniture 0 No Intravenous therapy Access/Saline/Heparin Lock 0 No Gait/Transferring Normal/ bed rest/ wheelchair 0 Yes Weak (short steps with or without shuffle, stooped but able to lift  head while walking, may seek 0 No support from furniture) Impaired (short steps with shuffle, may have difficulty arising from chair, head down, impaired 0 No balance) Mental Status Oriented to own ability 0 Yes Electronic Signature(s) Signed: 07/12/2020 6:16:40 PM By: Shawn Stall Entered By: Shawn Stall on 07/12/2020 14:58:16 -------------------------------------------------------------------------------- Foot Assessment Details Patient Name: Date of Service: [name], [name] 07/12/2020 2:45 PM Medical Record Number: [id number] Patient Account Number: [id number] Date of Birth/Sex: Treating RN: 06-28-1965 (55 y.o. Female) Shawn Stall Primary Care Kyelle Urbas: [place], Betty Other Clinician: Referring Slayton Lubitz: Treating Jorje Vanatta/Extender: Robson, Michael [place], Betty Weeks in Treatment: 0 Foot Assessment Items Site Locations + = Sensation present, - = Sensation absent, C = Callus, U = Ulcer R = Redness, W = Warmth, M = Maceration, PU = Pre-ulcerative lesion F = Fissure, S = Swelling, D = Dryness Assessment Right: Left: Other Deformity: No No Prior Foot Ulcer: No No Prior Amputation: No No Charcot Joint: No No Ambulatory Status: Ambulatory Without Help Gait:  Steady Electronic Signature(s) Signed: 07/12/2020 6:16:40 PM By: Shawn Stall Entered By: Shawn Stall on 07/12/2020 15:18:10 -------------------------------------------------------------------------------- Nutrition Risk Screening Details Patient Name: Date of Service: [name], [name] 07/12/2020 2:45 PM Medical Record Number: [id number] Patient Account Number: [id number] Date of Birth/Sex: Treating RN: 16-Jun-1965 (55 y.o. Female) Shawn Stall Primary Care Mahek Schlesinger: [place], Betty Other Clinician: Referring Donnia Poplaski: Treating Kareem Aul/Extender: Robson, Michael [place], Betty Weeks in Treatment: 0 Height (in): 67 Weight (lbs): 270 Body Mass Index (BMI): 42.3 Nutrition Risk Screening Items Score Screening NUTRITION RISK SCREEN: I have an illness or condition that made me change the kind and/or amount of food I eat 0 No I eat fewer than two meals per day 0 No I eat few fruits and vegetables, or milk products 0 No I have three or more drinks of beer, liquor or wine almost every day 0 No I have tooth or mouth problems that make it hard for me to eat 0 No I don't always have enough money to buy the food I need 0 No I eat alone most of the time 0 No I take three or more different prescribed or over-the-counter drugs a day 1 Yes Without wanting to, I have lost or gained 10 pounds in the last six months 0 No I am not always physically able to shop, cook and/or feed myself 0 No Nutrition Protocols Good Risk Protocol 0 No interventions needed Moderate Risk Protocol High Risk Proctocol Risk Level: Good Risk Score: 1 Electronic Signature(s) Signed: 07/12/2020 6:16:40 PM By: Shawn Stall Entered By: Shawn Stall on 07/12/2020 14:58:55

## 2020-07-12 NOTE — Progress Notes (Signed)
Liberty HandyCORNEY, Tijana ([id number]) Visit Report for 07/12/2020 Chief Complaint Document Details Patient Name: Date of Service: [name], [name] 07/12/2020 2:45 PM Medical Record Number: [id number] Patient Account Number: [id number] Date of Birth/Sex: Treating RN: 1965/07/09 (55 y.o. Female) Antonieta IbaBarnhart, Jodi Primary Care Provider: [place], Betty Other Clinician: Referring Provider: Treating Provider/Extender: Stephenie Navejas [place], Betty Weeks in Treatment: 0 Information Obtained from: Patient Chief Complaint 07/12/2020; patient arrives in clinic for review of a left lateral ankle surgical wound dehiscence Electronic Signature(s) Signed: 07/12/2020 5:16:47 PM By: Baltazar Najjarobson, Ginger Leeth MD Entered By: Baltazar Najjarobson, Yelina Sarratt on 07/12/2020 16:38:24 -------------------------------------------------------------------------------- Debridement Details Patient Name: Date of Service: [name], [name] 07/12/2020 2:45 PM Medical Record Number: [id number] Patient Account Number: [id number] Date of Birth/Sex: Treating RN: 1965/07/09 (55 y.o. Female) Antonieta IbaBarnhart, Jodi Primary Care Provider: [place], Betty Other Clinician: Referring Provider: Treating Provider/Extender: Abdoul Encinas [place], Betty Weeks in Treatment: 0 Debridement Performed for Assessment: Wound #1 Left,Lateral Ankle Performed By: Physician Maxwell Caulobson, Kentley Blyden G., MD Debridement Type: Debridement Level of Consciousness (Pre-procedure): Awake and Alert Pre-procedure Verification/Time Out Yes - 15:35 Taken: Start Time: 15:36 Pain Control: Other : Benzocaine T Area Debrided (L x W): otal 3.8 (cm) x 1.5 (cm) = 5.7 (cm) Tissue and other material debrided: Non-Viable, Slough, Subcutaneous, Biofilm, Slough Level: Skin/Subcutaneous Tissue Debridement Description: Excisional Instrument: Curette Bleeding: Minimum Hemostasis Achieved: Pressure End Time: 15:42 Response to Treatment: Procedure was tolerated well Level of Consciousness (Post- Awake and  Alert procedure): Post Debridement Measurements of Total Wound Length: (cm) 3.8 Width: (cm) 1.5 Depth: (cm) 0.5 Volume: (cm) 2.238 Character of Wound/Ulcer Post Debridement: Stable Post Procedure Diagnosis Same as Pre-procedure Electronic Signature(s) Signed: 07/12/2020 5:10:19 PM By: Antonieta IbaBarnhart, Jodi Signed: 07/12/2020 5:16:47 PM By: Baltazar Najjarobson, Akshara Blumenthal MD Entered By: Baltazar Najjarobson, Yaresly Menzel on 07/12/2020 16:53:33 -------------------------------------------------------------------------------- HPI Details Patient Name: Date of Service: [name], [name] 07/12/2020 2:45 PM Medical Record Number: [id number] Patient Account Number: [id number] Date of Birth/Sex: Treating RN: 1965/07/09 (55 y.o. Female) Antonieta IbaBarnhart, Jodi Primary Care Provider: [place], Betty Other Clinician: Referring Provider: Treating Provider/Extender: Lylianna Fraiser [place], Betty Weeks in Treatment: 0 History of Present Illness HPI Description: ADMISSION 07/12/2020; This is a otherwise well 55 year old woman who suffered a fall on the ice in January. She developed a left ankle fibular fracture requiring an ORIF on February 15, 2020. She is followed by Dr. Margarita Ranaimothy Murphy and she has been using wet-to-dry dressings for a long period now however the wound is only slowly contracting and she was referred for our review of this. 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 She has some edema in her lower extremities and she uses some form of compression stocking. Past medical history includes hypothyroidism, irritable bowel syndrome, anemia. She may have some degree of chronic venous insufficiency necessitating use of compression stockings. She is not a diabetic ABI in the left leg in our clinic was noncompressible however her peripheral pulses are easily palpable Electronic Signature(s) Signed: 07/12/2020 5:16:47 PM By: Baltazar Najjarobson, Shakera Ebrahimi MD Entered By: Baltazar Najjarobson, Ruther Ephraim on 07/12/2020  16:42:20 -------------------------------------------------------------------------------- Physical Exam Details Patient Name: Date of Service: [name], [name] 07/12/2020 2:45 PM Medical Record Number: [id number] Patient Account Number: [id number] Date of Birth/Sex: Treating RN: 1965/07/09 (55 y.o. Female) Antonieta IbaBarnhart, Jodi Primary Care Provider: [place], Betty Other Clinician: Referring Provider: Treating Provider/Extender: Thelonious Kauffmann [place], Betty Weeks in Treatment: 0 Constitutional Patient is hypertensive.. Pulse regular and within target range for patient.Marland Kitchen. Respirations regular, non-labored and within target range.. Temperature is normal and within the  target range for the patient.Marland Kitchen Appears in no distress. Respiratory work of breathing is normal. Cardiovascular Pedal pulses are vibrant on the left. Patient has pitting edema in her lower legs probably some degree of chronic venous insufficiency given skin discoloration about the ankle on the left lateral although this may also be traumatic. There is no palpable tenderness. Notes Wound exam; the patient has eschared circumference debris all around the wound. This was carefully removed with a #5 curette. Also under illumination nonviable surface debris removed with a #5 curette subcutaneous debridement hemostasis with direct pressure Electronic Signature(s) Signed: 07/12/2020 5:16:47 PM By: Baltazar Najjar MD Entered By: Baltazar Najjar on 07/12/2020 16:46:02 -------------------------------------------------------------------------------- Physician Orders Details Patient Name: Date of Service: [name], [name] 07/12/2020 2:45 PM Medical Record Number: [id number] Patient Account Number: [id number] Date of Birth/Sex: Treating RN: [birth year] (55 y.o. Female) Antonieta Iba Primary Care Provider: [place], Betty Other Clinician: Referring Provider: Treating Provider/Extender: Blakely Gluth [place], Betty Weeks in Treatment:  0 Verbal / Phone Orders: No Diagnosis Coding Follow-up Appointments ppointment in 1 week. - with Dr. Leanord Hawking Return A Bathing/ Shower/ Hygiene May shower with protection but do not get wound dressing(s) wet. Edema Control - Lymphedema / SCD / Other Elevate legs to the level of the heart or above for 30 minutes daily and/or when sitting, a frequency of: Avoid standing for long periods of time. Additional Orders / Instructions Follow Nutritious Diet - Increase protein Wound Treatment Wound #1 - Ankle Wound Laterality: Left, Lateral Cleanser: Soap and Water 1 x Per Week/30 Days Discharge Instructions: May shower and wash wound with dial antibacterial soap and water prior to dressing change. Cleanser: Wound Cleanser 1 x Per Week/30 Days Discharge Instructions: Cleanse the wound with wound cleanser prior to applying a clean dressing using gauze sponges, not tissue or cotton balls. Peri-Wound Care: Sween Lotion (Moisturizing lotion) 1 x Per Week/30 Days Discharge Instructions: Apply moisturizing lotion as directed Prim Dressing: IODOFLEX 0.9% Cadexomer Iodine Pad 4x6 cm 1 x Per Week/30 Days ary Discharge Instructions: Apply to wound bed as instructed Secondary Dressing: Woven Gauze Sponge, Non-Sterile 4x4 in 1 x Per Week/30 Days Discharge Instructions: Apply over primary dressing as directed. Secondary Dressing: ABD Pad, 5x9 1 x Per Week/30 Days Discharge Instructions: Apply over primary dressing as directed. Compression Wrap: ThreePress (3 layer compression wrap) 1 x Per Week/30 Days Discharge Instructions: Apply three layer compression as directed. Electronic Signature(s) Signed: 07/12/2020 5:10:19 PM By: Antonieta Iba Signed: 07/12/2020 5:16:47 PM By: Baltazar Najjar MD Entered By: Antonieta Iba on 07/12/2020 15:46:22 -------------------------------------------------------------------------------- Problem List Details Patient Name: Date of Service: [name], [name] 07/12/2020 2:45  PM Medical Record Number: [id number] Patient Account Number: [id number] Date of Birth/Sex: Treating RN: September 10, 1965 (55 y.o. Female) Antonieta Iba Primary Care Provider: [place], Betty Other Clinician: Referring Provider: Treating Provider/Extender: Davin Archuletta [place], Betty Weeks in Treatment: 0 Active Problems ICD-10 Encounter Code Description Active Date MDM Diagnosis T81.31XD Disruption of external operation (surgical) wound, not elsewhere classified, 07/12/2020 No Yes subsequent encounter I87.312 Chronic venous hypertension (idiopathic) with ulcer of left lower extremity 07/12/2020 No Yes L97.828 Non-pressure chronic ulcer of other part of left lower leg with other specified 07/12/2020 No Yes severity Inactive Problems Resolved Problems Electronic Signature(s) Signed: 07/12/2020 5:16:47 PM By: Baltazar Najjar MD Entered By: Baltazar Najjar on 07/12/2020 16:37:29 -------------------------------------------------------------------------------- Progress Note Details Patient Name: Date of Service: [name], [name] 07/12/2020 2:45 PM Medical Record Number: [id number] Patient Account Number: [id number] Date of Birth/Sex: Treating RN: [birth year] (55 y.o. Female)  Antonieta Iba Primary Care Provider: [place], Betty Other Clinician: Referring Provider: Treating Provider/Extender: Yacqub Baston [place], Betty Weeks in Treatment: 0 Subjective Chief Complaint Information obtained from Patient 07/12/2020; patient arrives in clinic for review of a left lateral ankle surgical wound dehiscence History of Present Illness (HPI) ADMISSION 07/12/2020; This is a otherwise well 55 year old woman who suffered a fall on the ice in January. She developed a left ankle fibular fracture requiring an ORIF on February 15, 2020. She is followed by Dr. Margarita Rana and she has been using wet-to-dry dressings for a long period now however the wound is only slowly contracting and she was referred for  our review of this. 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 She has some edema in her lower extremities and she uses some form of compression stocking. Past medical history includes hypothyroidism, irritable bowel syndrome, anemia. She may have some degree of chronic venous insufficiency necessitating use of compression stockings. She is not a diabetic ABI in the left leg in our clinic was noncompressible however her peripheral pulses are easily palpable Patient History Information obtained from Patient. Allergies Sulfa (Sulfonamide Antibiotics) (Reaction: Hives) Family History Cancer - Siblings,Father, Diabetes - Mother, Heart Disease - Mother, Hypertension - Mother, Kidney Disease - Father, Stroke - Mother, No family history of Hereditary Spherocytosis, Lung Disease, Seizures, Thyroid Problems, Tuberculosis. Social History Never smoker, Marital Status - Single, Alcohol Use - Never, Drug Use - No History, Caffeine Use - Daily - coffee. Medical History Eyes Denies history of Cataracts, Glaucoma, Optic Neuritis Ear/Nose/Mouth/Throat Denies history of Chronic sinus problems/congestion, Middle ear problems Hematologic/Lymphatic Patient has history of Anemia Denies history of Hemophilia, Human Immunodeficiency Virus, Lymphedema, Sickle Cell Disease Respiratory Patient has history of Asthma - childhood Denies history of Aspiration, Chronic Obstructive Pulmonary Disease (COPD), Pneumothorax, Sleep Apnea, Tuberculosis Cardiovascular Denies history of Angina, Arrhythmia, Congestive Heart Failure, Coronary Artery Disease, Deep Vein Thrombosis, Hypertension, Hypotension, Myocardial Infarction, Peripheral Arterial Disease, Peripheral Venous Disease, Phlebitis, Vasculitis Gastrointestinal Denies history of Cirrhosis , Colitis, Crohnoos, Hepatitis A, Hepatitis B, Hepatitis C Endocrine Denies history of Type I Diabetes, Type II  Diabetes Genitourinary Denies history of End Stage Renal Disease Immunological Denies history of Lupus Erythematosus, Raynaudoos, Scleroderma Integumentary (Skin) Denies history of History of Burn Musculoskeletal Denies history of Gout, Rheumatoid Arthritis, Osteoarthritis, Osteomyelitis Neurologic Denies history of Dementia, Neuropathy, Quadriplegia, Paraplegia, Seizure Disorder Oncologic Denies history of Received Chemotherapy, Received Radiation Psychiatric Denies history of Anorexia/bulimia, Confinement Anxiety Hospitalization/Surgery History - ORIF left ankle 02/2020. Medical A Surgical History Notes nd Constitutional Symptoms (General Health) Hypothyroidism history of radiation 20 years ago to goiter Gastrointestinal IBS Review of Systems (ROS) Constitutional Symptoms (General Health) Denies complaints or symptoms of Fatigue, Fever, Chills, Marked Weight Change. Eyes Complains or has symptoms of Glasses / Contacts - reading. Denies complaints or symptoms of Dry Eyes, Vision Changes. Ear/Nose/Mouth/Throat Denies complaints or symptoms of Chronic sinus problems or rhinitis. Respiratory Denies complaints or symptoms of Chronic or frequent coughs, Shortness of Breath. Cardiovascular Denies complaints or symptoms of Chest pain. Gastrointestinal Denies complaints or symptoms of Frequent diarrhea, Nausea, Vomiting. Endocrine Denies complaints or symptoms of Heat/cold intolerance. Genitourinary Denies complaints or symptoms of Frequent urination. Integumentary (Skin) Complains or has symptoms of Wounds - left ankle. Musculoskeletal Denies complaints or symptoms of Muscle Pain, Muscle Weakness. Neurologic Denies complaints or symptoms of Numbness/parasthesias. Psychiatric Denies complaints or symptoms of Claustrophobia, Suicidal. Objective Constitutional Patient is hypertensive.. Pulse regular and  within target range for patient.Marland Kitchen Respirations regular, non-labored and  within target range.. Temperature is normal and within the target range for the patient.Marland Kitchen Appears in no distress. Vitals Time Taken: 2:54 PM, Height: 67 in, Source: Stated, Weight: 270 lbs, Source: Stated, BMI: 42.3, Temperature: 98.3 F, Pulse: 84 bpm, Respiratory Rate: 18 breaths/min, Blood Pressure: 164/96 mmHg. Respiratory work of breathing is normal. Cardiovascular Pedal pulses are vibrant on the left. Patient has pitting edema in her lower legs probably some degree of chronic venous insufficiency given skin discoloration about the ankle on the left lateral although this may also be traumatic. There is no palpable tenderness. General Notes: Wound exam; the patient has eschared circumference debris all around the wound. This was carefully removed with a #5 curette. Also under illumination nonviable surface debris removed with a #5 curette subcutaneous debridement hemostasis with direct pressure Integumentary (Hair, Skin) Wound #1 status is Open. Original cause of wound was Surgical Injury. The date acquired was: 02/15/2020. The wound is located on the Left,Lateral Ankle. The wound measures 3.8cm length x 1.5cm width x 0.5cm depth; 4.477cm^2 area and 2.238cm^3 volume. There is Fat Layer (Subcutaneous Tissue) exposed. There is no tunneling or undermining noted. There is a medium amount of serosanguineous drainage noted. The wound margin is thickened. There is medium (34-66%) red granulation within the wound bed. There is a medium (34-66%) amount of necrotic tissue within the wound bed including Adherent Slough. Assessment Active Problems ICD-10 Disruption of external operation (surgical) wound, not elsewhere classified, subsequent encounter Chronic venous hypertension (idiopathic) with ulcer of left lower extremity Non-pressure chronic ulcer of other part of left lower leg with other specified severity Procedures Wound #1 Pre-procedure diagnosis of Wound #1 is an Open Surgical Wound located  on the Left,Lateral Ankle . There was a Selective/Open Wound Non-Viable Tissue Debridement with a total area of 5.7 sq cm performed by Maxwell Caul., MD. With the following instrument(s): Curette to remove Non-Viable tissue/material. Material removed includes Slough and Biofilm and after achieving pain control using Other (Benzocaine). No specimens were taken. A time out was conducted at 15:35, prior to the start of the procedure. A Minimum amount of bleeding was controlled with Pressure. The procedure was tolerated well. Post Debridement Measurements: 3.8cm length x 1.5cm width x 0.5cm depth; 2.238cm^3 volume. Character of Wound/Ulcer Post Debridement is stable. Post procedure Diagnosis Wound #1: Same as Pre-Procedure Pre-procedure diagnosis of Wound #1 is an Open Surgical Wound located on the Left,Lateral Ankle . There was a Three Layer Compression Therapy Procedure by Antonieta Iba, RN. Post procedure Diagnosis Wound #1: Same as Pre-Procedure Plan Follow-up Appointments: Return Appointment in 1 week. - with Dr. Chauncey Mann Shower/ Hygiene: May shower with protection but do not get wound dressing(s) wet. Edema Control - Lymphedema / SCD / Other: Elevate legs to the level of the heart or above for 30 minutes daily and/or when sitting, a frequency of: Avoid standing for long periods of time. Additional Orders / Instructions: Follow Nutritious Diet - Increase protein WOUND #1: - Ankle Wound Laterality: Left, Lateral Cleanser: Soap and Water 1 x Per Week/30 Days Discharge Instructions: May shower and wash wound with dial antibacterial soap and water prior to dressing change. Cleanser: Wound Cleanser 1 x Per Week/30 Days Discharge Instructions: Cleanse the wound with wound cleanser prior to applying a clean dressing using gauze sponges, not tissue or cotton balls. Peri-Wound Care: Sween Lotion (Moisturizing lotion) 1 x Per Week/30 Days Discharge Instructions: Apply moisturizing  lotion as directed  Prim Dressing: IODOFLEX 0.9% Cadexomer Iodine Pad 4x6 cm 1 x Per Week/30 Days ary Discharge Instructions: Apply to wound bed as instructed Secondary Dressing: Woven Gauze Sponge, Non-Sterile 4x4 in 1 x Per Week/30 Days Discharge Instructions: Apply over primary dressing as directed. Secondary Dressing: ABD Pad, 5x9 1 x Per Week/30 Days Discharge Instructions: Apply over primary dressing as directed. Com pression Wrap: ThreePress (3 layer compression wrap) 1 x Per Week/30 Days Discharge Instructions: Apply three layer compression as directed. 1. The Iodoflex under 3 layer compression 2. The big reasons for the nonhealing here are uncontrolled swelling and a nonviable surface. Especially the latter. Further mechanical debridement is likely to be necessary because of this. 3. I think she would be an ideal candidate for an advanced treatment option once we can get the surface of this close. 4. There was no exposed hardware, no evidence of infection. An x-ray was done on 05/22/2020 there was nothing remarkable here. No cultures were obtained Electronic Signature(s) Signed: 07/12/2020 5:16:47 PM By: Baltazar Najjar MD Entered By: Baltazar Najjar on 07/12/2020 16:52:54 -------------------------------------------------------------------------------- HxROS Details Patient Name: Date of Service: [name], [name] 07/12/2020 2:45 PM Medical Record Number: [id number] Patient Account Number: [id number] Date of Birth/Sex: Treating RN: [birth year] (55 y.o. Female) Shawn Stall Primary Care Provider: [place], Betty Other Clinician: Referring Provider: Treating Provider/Extender: Fanchon Papania [place], Betty Weeks in Treatment: 0 Information Obtained From Patient Constitutional Symptoms (General Health) Complaints and Symptoms: Negative for: Fatigue; Fever; Chills; Marked Weight Change Medical History: Past Medical History Notes: Hypothyroidism history of radiation 20 years ago  to goiter Eyes Complaints and Symptoms: Positive for: Glasses / Contacts - reading Negative for: Dry Eyes; Vision Changes Medical History: Negative for: Cataracts; Glaucoma; Optic Neuritis Ear/Nose/Mouth/Throat Complaints and Symptoms: Negative for: Chronic sinus problems or rhinitis Medical History: Negative for: Chronic sinus problems/congestion; Middle ear problems Respiratory Complaints and Symptoms: Negative for: Chronic or frequent coughs; Shortness of Breath Medical History: Positive for: Asthma - childhood Negative for: Aspiration; Chronic Obstructive Pulmonary Disease (COPD); Pneumothorax; Sleep Apnea; Tuberculosis Cardiovascular Complaints and Symptoms: Negative for: Chest pain Medical History: Negative for: Angina; Arrhythmia; Congestive Heart Failure; Coronary Artery Disease; Deep Vein Thrombosis; Hypertension; Hypotension; Myocardial Infarction; Peripheral Arterial Disease; Peripheral Venous Disease; Phlebitis; Vasculitis Gastrointestinal Complaints and Symptoms: Negative for: Frequent diarrhea; Nausea; Vomiting Medical History: Negative for: Cirrhosis ; Colitis; Crohns; Hepatitis A; Hepatitis B; Hepatitis C Past Medical History Notes: IBS Endocrine Complaints and Symptoms: Negative for: Heat/cold intolerance Medical History: Negative for: Type I Diabetes; Type II Diabetes Genitourinary Complaints and Symptoms: Negative for: Frequent urination Medical History: Negative for: End Stage Renal Disease Integumentary (Skin) Complaints and Symptoms: Positive for: Wounds - left ankle Medical History: Negative for: History of Burn Musculoskeletal Complaints and Symptoms: Negative for: Muscle Pain; Muscle Weakness Medical History: Negative for: Gout; Rheumatoid Arthritis; Osteoarthritis; Osteomyelitis Neurologic Complaints and Symptoms: Negative for: Numbness/parasthesias Medical History: Negative for: Dementia; Neuropathy; Quadriplegia; Paraplegia; Seizure  Disorder Psychiatric Complaints and Symptoms: Negative for: Claustrophobia; Suicidal Medical History: Negative for: Anorexia/bulimia; Confinement Anxiety Hematologic/Lymphatic Medical History: Positive for: Anemia Negative for: Hemophilia; Human Immunodeficiency Virus; Lymphedema; Sickle Cell Disease Immunological Medical History: Negative for: Lupus Erythematosus; Raynauds; Scleroderma Oncologic Medical History: Negative for: Received Chemotherapy; Received Radiation Immunizations Pneumococcal Vaccine: Received Pneumococcal Vaccination: No Implantable Devices None Hospitalization / Surgery History Type of Hospitalization/Surgery ORIF left ankle 02/2020 Family and Social History Cancer: Yes - Siblings,Father; Diabetes: Yes - Mother; Heart Disease: Yes - Mother; Hereditary Spherocytosis: No; Hypertension: Yes - Mother; Kidney Disease: Yes -  Father; Lung Disease: No; Seizures: No; Stroke: Yes - Mother; Thyroid Problems: No; Tuberculosis: No; Never smoker; Marital Status - Single; Alcohol Use: Never; Drug Use: No History; Caffeine Use: Daily - coffee; Financial Concerns: No; Food, Clothing or Shelter Needs: No; Support System Lacking: No; Transportation Concerns: No Electronic Signature(s) Signed: 07/12/2020 5:16:47 PM By: Baltazar Najjar MD Signed: 07/12/2020 6:16:40 PM By: Shawn Stall Entered By: Shawn Stall on 07/12/2020 15:03:25 -------------------------------------------------------------------------------- SuperBill Details Patient Name: Date of Service: [name], [name] 07/12/2020 Medical Record Number: [id number] Patient Account Number: [id number] Date of Birth/Sex: Treating RN: Feb 18, 1965 (55 y.o. Female) Antonieta Iba Primary Care Provider: [place], Betty Other Clinician: Referring Provider: Treating Provider/Extender: Gladine Plude [place], Betty Weeks in Treatment: 0 Diagnosis Coding ICD-10 Codes Code Description T81.31XD Disruption of external operation  (surgical) wound, not elsewhere classified, subsequent encounter I87.312 Chronic venous hypertension (idiopathic) with ulcer of left lower extremity L97.828 Non-pressure chronic ulcer of other part of left lower leg with other specified severity Facility Procedures CPT4 Code: [id number] Description: 99213 - WOUND CARE VISIT-LEV 3 EST PT Modifier: 25 Quantity: 1 CPT4 Code: [id number] Description: 11042 - DEB SUBQ TISSUE 20 SQ CM/< ICD-10 Diagnosis Description L97.828 Non-pressure chronic ulcer of other part of left lower leg with other specified s T81.31XD Disruption of external operation (surgical) wound, not elsewhere classified,  subs Modifier: everity equent encounter Quantity: 1 Physician Procedures : CPT4 Code Description Modifier [id number] - WC PHYS SUBQ TISS 20 SQ CM ICD-10 Diagnosis Description L97.828 Non-pressure chronic ulcer of other part of left lower leg with other specified severity T81.31XD Disruption of external operation (surgical)  wound, not elsewhere classified, subsequent encounter Quantity: 1 Electronic Signature(s) Signed: 07/12/2020 4:54:23 PM By: Antonieta Iba Signed: 07/12/2020 5:16:47 PM By: Baltazar Najjar MD Entered By: Antonieta Iba on 07/12/2020 16:54:23

## 2020-07-12 NOTE — Progress Notes (Signed)
Liberty HandyCORNEY, [name] ([id number]) Visit Report for 07/12/2020 Allergy List Details Patient Name: Date of Service: [name], [name] 07/12/2020 2:45 PM Medical Record Number: [id number] Patient Account Number: [id number] Date of Birth/Sex: Treating RN: 11-26-1965 (55 y.o. Female) Shawn Stalleaton, Bobbi Primary Care Cache Decoursey: [place], Betty Other Clinician: Referring Stacie Templin: Treating Hikari Tripp/Extender: Robson, Michael [place], Betty Weeks in Treatment: 0 Allergies Active Allergies Sulfa (Sulfonamide Antibiotics) Reaction: Hives Allergy Notes Electronic Signature(s) Signed: 07/12/2020 6:16:40 PM By: Shawn Stalleaton, Bobbi Entered By: Shawn Stalleaton, Bobbi on 07/12/2020 14:57:02 -------------------------------------------------------------------------------- Arrival Information Details Patient Name: Date of Service: [name], [name] 07/12/2020 2:45 PM Medical Record Number: [id number] Patient Account Number: [id number] Date of Birth/Sex: Treating RN: 11-26-1965 (55 y.o. Female) Shawn Stalleaton, Bobbi Primary Care Farryn Linares: [place], Betty Other Clinician: Referring Jaivon Vanbeek: Treating Yassmin Binegar/Extender: Robson, Michael [place], Betty Weeks in Treatment: 0 Visit Information Patient Arrived: Ambulatory Arrival Time: 14:48 Accompanied By: self Transfer Assistance: None Patient Identification Verified: Yes Secondary Verification Process Completed: Yes Patient Requires Transmission-Based Precautions: No Patient Has Alerts: No Electronic Signature(s) Signed: 07/12/2020 6:16:40 PM By: Shawn Stalleaton, Bobbi Entered By: Shawn Stalleaton, Bobbi on 07/12/2020 14:54:29 -------------------------------------------------------------------------------- Clinic Level of Care Assessment Details Patient Name: Date of Service: [name], [name] 07/12/2020 2:45 PM Medical Record Number: [id number] Patient Account Number: [id number] Date of Birth/Sex: Treating RN: 11-26-1965 (55 y.o. Female) Antonieta IbaBarnhart, Jodi Primary Care Kerry-Anne Mezo: [place], Betty Other  Clinician: Referring Crystol Walpole: Treating Jalal Rauch/Extender: Robson, Michael [place], Betty Weeks in Treatment: 0 Clinic Level of Care Assessment Items TOOL 1 Quantity Score X- 1 0 Use when EandM and Procedure is performed on INITIAL visit ASSESSMENTS - Nursing Assessment / Reassessment X- 1 20 General Physical Exam (combine w/ comprehensive assessment (listed just below) when performed on new pt. evals) X- 1 25 Comprehensive Assessment (HX, ROS, Risk Assessments, Wounds Hx, etc.) ASSESSMENTS - Wound and Skin Assessment / Reassessment []  - 0 Dermatologic / Skin Assessment (not related to wound area) ASSESSMENTS - Ostomy and/or Continence Assessment and Care []  - 0 Incontinence Assessment and Management []  - 0 Ostomy Care Assessment and Management (repouching, etc.) PROCESS - Coordination of Care []  - 0 Simple Patient / Family Education for ongoing care X- 1 20 Complex (extensive) Patient / Family Education for ongoing care X- 1 10 Staff obtains ChiropractorConsents, Records, T Results / Process Orders est X- 1 10 Staff telephones HHA, Nursing Homes / Clarify orders / etc []  - 0 Routine Transfer to another Facility (non-emergent condition) []  - 0 Routine [facility] Admission (non-emergent condition) []  - 0 New Admissions / Manufacturing engineernsurance Authorizations / Ordering NPWT Apligraf, etc. , []  - 0 Emergency [facility] Admission (emergent condition) PROCESS - Special Needs []  - 0 Pediatric / Minor Patient Management []  - 0 Isolation Patient Management []  - 0 Hearing / Language / Visual special needs []  - 0 Assessment of Community assistance (transportation, D/C planning, etc.) []  - 0 Additional assistance / Altered mentation []  - 0 Support Surface(s) Assessment (bed, cushion, seat, etc.) INTERVENTIONS - Miscellaneous []  - 0 External ear exam []  - 0 Patient Transfer (multiple staff / Nurse, adultHoyer Lift / Similar devices) []  - 0 Simple Staple / Suture removal (25 or less) []  - 0 Complex Staple /  Suture removal (26 or more) []  - 0 Hypo/Hyperglycemic Management (do not check if billed separately) X- 1 15 Ankle / Brachial Index (ABI) - do not check if billed separately Has the patient been seen at the [facility] within the last three years: Yes Total Score: 100 Level Of Care: New/Established - Level 3 Electronic Signature(s) Signed: 07/12/2020 5:10:19 PM  By: Antonieta Iba Entered By: Antonieta Iba on 07/12/2020 15:46:53 -------------------------------------------------------------------------------- Compression Therapy Details Patient Name: Date of Service: [name], [name] 07/12/2020 2:45 PM Medical Record Number: [id number] Patient Account Number: [id number] Date of Birth/Sex: Treating RN: 1965/10/17 (55 y.o. Female) Antonieta Iba Primary Care Sayuri Rhames: [place], Betty Other Clinician: Referring Javani Spratt: Treating Abdishakur Gottschall/Extender: Robson, Michael [place], Betty Weeks in Treatment: 0 Compression Therapy Performed for Wound Assessment: Wound #1 Left,Lateral Ankle Performed By: Clinician Antonieta Iba, RN Compression Type: Three Layer Post Procedure Diagnosis Same as Pre-procedure Electronic Signature(s) Signed: 07/12/2020 5:10:19 PM By: Antonieta Iba Entered By: Antonieta Iba on 07/12/2020 15:44:27 -------------------------------------------------------------------------------- Lower Extremity Assessment Details Patient Name: Date of Service: [name], [name] 07/12/2020 2:45 PM Medical Record Number: [id number] Patient Account Number: [id number] Date of Birth/Sex: Treating RN: 1965/02/10 (55 y.o. Female) Elesa Hacker, [place] Primary Care Tylasia Fletchall: [place], Betty Other Clinician: Referring Jaqulyn Chancellor: Treating Kolbe Delmonaco/Extender: Robson, Michael [place], Betty Weeks in Treatment: 0 Edema Assessment Assessed: Kyra Searles: Yes] [Right: No] Edema: [Left: Ye] [Right: s] Calf Left: Right: Point of Measurement: 36 cm From Medial Instep 36 cm Ankle Left: Right: Point of  Measurement: 11 cm From Medial Instep 11 cm Knee To Floor Left: Right: From Medial Instep 45 cm Vascular Assessment Pulses: Dorsalis Pedis Palpable: [Left:Yes] Doppler Audible: [Left:Yes] Posterior Tibial Palpable: [Left:Yes Yes] Notes left leg ABI noncompressible. Electronic Signature(s) Signed: 07/12/2020 6:16:40 PM By: Shawn Stall Entered By: Shawn Stall on 07/12/2020 15:20:10 -------------------------------------------------------------------------------- Multi Wound Chart Details Patient Name: Date of Service: [name], [name] 07/12/2020 2:45 PM Medical Record Number: [id number] Patient Account Number: [id number] Date of Birth/Sex: Treating RN: March 02, 1965 (55 y.o. Female) Antonieta Iba Primary Care Mackinsey Pelland: [place], Betty Other Clinician: Referring Andriel Omalley: Treating Trust Leh/Extender: Robson, Michael [place], Betty Weeks in Treatment: 0 Vital Signs Height(in): 67 Pulse(bpm): 84 Weight(lbs): 270 Blood Pressure(mmHg): 164/96 Body Mass Index(BMI): 42 Temperature(F): 98.3 Respiratory Rate(breaths/min): 18 Photos: [1:No Photos Left, Lateral Ankle] [N/A:N/A N/A] Wound Location: [1:Surgical Injury] [N/A:N/A] Wounding Event: [1:Open Surgical Wound] [N/A:N/A] Primary Etiology: [1:Anemia, Asthma] [N/A:N/A] Comorbid History: [1:02/15/2020] [N/A:N/A] Date Acquired: [1:0] [N/A:N/A] Weeks of Treatment: [1:Open] [N/A:N/A] Wound Status: [1:3.8x1.5x0.5] [N/A:N/A] Measurements L x W x D (cm) [1:4.477] [N/A:N/A] A (cm) : rea [1:2.238] [N/A:N/A] Volume (cm) : [1:Full Thickness Without Exposed] [N/A:N/A] Classification: [1:Support Structures Medium] [N/A:N/A] Exudate A mount: [1:Serosanguineous] [N/A:N/A] Exudate Type: [1:red, brown] [N/A:N/A] Exudate Color: [1:Thickened] [N/A:N/A] Wound Margin: [1:Medium (34-66%)] [N/A:N/A] Granulation A mount: [1:Red] [N/A:N/A] Granulation Quality: [1:Medium (34-66%)] [N/A:N/A] Necrotic A mount: [1:Fat Layer (Subcutaneous Tissue):  Yes N/A] Exposed Structures: [1:Fascia: No Tendon: No Muscle: No Joint: No Bone: No None] [N/A:N/A] Epithelialization: [1:Debridement - Selective/Open Wound N/A] Debridement: Pre-procedure Verification/Time Out 15:35 [N/A:N/A] Taken: [1:Other] [N/A:N/A] Pain Control: [1:Slough] [N/A:N/A] Tissue Debrided: [1:Non-Viable Tissue] [N/A:N/A] Level: [1:5.7] [N/A:N/A] Debridement A (sq cm): [1:rea Curette] [N/A:N/A] Instrument: [1:Minimum] [N/A:N/A] Bleeding: [1:Pressure] [N/A:N/A] Hemostasis A chieved: [1:Procedure was tolerated well] [N/A:N/A] Debridement Treatment Response: [1:3.8x1.5x0.5] [N/A:N/A] Post Debridement Measurements L x W x D (cm) [1:2.238] [N/A:N/A] Post Debridement Volume: (cm) [1:Compression Therapy] [N/A:N/A] Procedures Performed: [1:Debridement] Treatment Notes Electronic Signature(s) Signed: 07/12/2020 5:10:19 PM By: Antonieta Iba Signed: 07/12/2020 5:16:47 PM By: Baltazar Najjar MD Entered By: Baltazar Najjar on 07/12/2020 16:37:35 -------------------------------------------------------------------------------- Multi-Disciplinary Care Plan Details Patient Name: Date of Service: [name], [name] 07/12/2020 2:45 PM Medical Record Number: [id number] Patient Account Number: [id number] Date of Birth/Sex: Treating RN: [birth year] (55 y.o. Female) Antonieta Iba Primary Care Asaf Elmquist: [place], Betty Other Clinician: Referring Rand Boller: Treating Marquet Faircloth/Extender: Robson, Michael [place], Betty Weeks in Treatment: 0 Active Inactive Orientation to the Wound  Care Program Nursing Diagnoses: Knowledge deficit related to the wound healing center program Goals: Patient/caregiver will verbalize understanding of the Wound Healing Center Program Date Initiated: 07/12/2020 Target Resolution Date: 08/09/2020 Goal Status: Active Interventions: Provide education on orientation to the wound center Notes: Wound/Skin Impairment Nursing Diagnoses: Impaired tissue  integrity Goals: Patient/caregiver will verbalize understanding of skin care regimen Date Initiated: 07/12/2020 Target Resolution Date: 08/09/2020 Goal Status: Active Ulcer/skin breakdown will have a volume reduction of 30% by week 4 Date Initiated: 07/12/2020 Target Resolution Date: 08/09/2020 Goal Status: Active Interventions: Assess patient/caregiver ability to obtain necessary supplies Assess patient/caregiver ability to perform ulcer/skin care regimen upon admission and as needed Assess ulceration(s) every visit Provide education on ulcer and skin care Treatment Activities: Skin care regimen initiated : 07/12/2020 Topical wound management initiated : 07/12/2020 Notes: Electronic Signature(s) Signed: 07/12/2020 5:10:19 PM By: Antonieta Iba Entered By: Antonieta Iba on 07/12/2020 15:39:38 -------------------------------------------------------------------------------- Pain Assessment Details Patient Name: Date of Service: [name], [name] 07/12/2020 2:45 PM Medical Record Number: [id number] Patient Account Number: [id number] Date of Birth/Sex: Treating RN: 26-Jul-1965 (55 y.o. Female) Shawn Stall Primary Care Medora Roorda: [place], Betty Other Clinician: Referring Yola Paradiso: Treating Jago Carton/Extender: Robson, Michael [place], Betty Weeks in Treatment: 0 Active Problems Location of Pain Severity and Description of Pain Patient Has Paino Yes Site Locations Pain Location: Generalized Pain Rate the pain. Current Pain Level: 7 Worst Pain Level: 10 Least Pain Level: 0 Tolerable Pain Level: 8 Pain Management and Medication Current Pain Management: Medication: No Cold Application: No Rest: No Massage: No Activity: No T.E.N.S.: No Heat Application: No Leg drop or elevation: No Is the Current Pain Management Adequate: Adequate How does your wound impact your activities of daily livingo Sleep: No Bathing: No Appetite: No Relationship With Others: No Bladder Continence:  No Emotions: No Bowel Continence: No Work: No Toileting: No Drive: No Dressing: No Hobbies: No Electronic Signature(s) Signed: 07/12/2020 6:16:40 PM By: Shawn Stall Entered By: Shawn Stall on 07/12/2020 14:59:14 -------------------------------------------------------------------------------- Patient/Caregiver Education Details Patient Name: Date of Service: [name] 6/10/2022andnbsp2:45 PM Medical Record Number: [id number] Patient Account Number: [id number] Date of Birth/Gender: Treating RN: October 20, 1965 (55 y.o. Female) Antonieta Iba Primary Care Physician: [place], Betty Other Clinician: Referring Physician: Treating Physician/Extender: Robson, Michael [place], Betty Weeks in Treatment: 0 Education Assessment Education Provided To: Patient Education Topics Provided Venous: Methods: Explain/Verbal, Printed Responses: State content correctly Welcome [facility]: o Handouts: Welcome [facility] o Methods: Explain/Verbal, Printed Responses: State content correctly Wound/Skin Impairment: Methods: Demonstration, Explain/Verbal, Printed Responses: State content correctly Electronic Signature(s) Signed: 07/12/2020 5:10:19 PM By: Antonieta Iba Entered By: Antonieta Iba on 07/12/2020 15:40:04 -------------------------------------------------------------------------------- Wound Assessment Details Patient Name: Date of Service: [name], [name] 07/12/2020 2:45 PM Medical Record Number: [id number] Patient Account Number: [id number] Date of Birth/Sex: Treating RN: 07/12/1965 (55 y.o. Female) Shawn Stall Primary Care Lorraine Kazmierski: [place], Betty Other Clinician: Referring Aramis Weil: Treating Leonell Lobdell/Extender: Robson, Michael [place], Betty Weeks in Treatment: 0 Wound Status Wound Number: 1 Primary Etiology: Open Surgical Wound Wound Location: Left, Lateral Ankle Wound Status: Open Wounding Event: Surgical Injury Comorbid History:  Anemia, Asthma Date Acquired: 02/15/2020 Weeks Of Treatment: 0 Clustered Wound: No Photos Wound Measurements Length: (cm) 3.8 Width: (cm) 1.5 Depth: (cm) 0.5 Area: (cm) 4.477 Volume: (cm) 2.238 % Reduction in Area: 0% % Reduction in Volume: 0% Epithelialization: None Tunneling: No Undermining: No Wound Description Classification: Full Thickness Without Exposed Support Structures Wound Margin: Thickened Exudate Amount: Medium Exudate Type: Serosanguineous Exudate Color: red,  brown Wound Bed Granulation Amount: Medium (34-66%) Granulation Quality: Red Necrotic Amount: Medium (34-66%) Necrotic Quality: Adherent Slough Foul Odor After Cleansing: No Slough/Fibrino Yes Exposed Structure Fascia Exposed: No Fat Layer (Subcutaneous Tissue) Exposed: Yes Tendon Exposed: No Muscle Exposed: No Joint Exposed: No Bone Exposed: No Electronic Signature(s) Signed: 07/12/2020 4:58:44 PM By: Karl Ito Signed: 07/12/2020 6:16:40 PM By: Shawn Stall Entered By: Karl Ito on 07/12/2020 16:56:59 -------------------------------------------------------------------------------- Vitals Details Patient Name: Date of Service: [name], [name] 07/12/2020 2:45 PM Medical Record Number: [id number] Patient Account Number: [id number] Date of Birth/Sex: Treating RN: 11/13/1965 (55 y.o. Female) Elesa Hacker, [place] Primary Care Tam Savoia: [place], Betty Other Clinician: Referring Darlena Koval: Treating Denario Bagot/Extender: Robson, Michael [place], Betty Weeks in Treatment: 0 Vital Signs Time Taken: 14:54 Temperature (F): 98.3 Height (in): 67 Pulse (bpm): 84 Source: Stated Respiratory Rate (breaths/min): 18 Weight (lbs): 270 Blood Pressure (mmHg): 164/96 Source: Stated Reference Range: 80 - 120 mg / dl Body Mass Index (BMI): 42.3 Electronic Signature(s) Signed: 07/12/2020 6:16:40 PM By: Shawn Stall Entered By: Shawn Stall on 07/12/2020 14:55:52

## 2020-07-16 ENCOUNTER — Other Ambulatory Visit: Payer: Self-pay

## 2020-07-16 ENCOUNTER — Encounter (HOSPITAL_BASED_OUTPATIENT_CLINIC_OR_DEPARTMENT_OTHER): Payer: 59 | Admitting: Internal Medicine

## 2020-07-16 DIAGNOSIS — L97828 Non-pressure chronic ulcer of other part of left lower leg with other specified severity: Secondary | ICD-10-CM | POA: Diagnosis not present

## 2020-07-16 NOTE — Progress Notes (Signed)
[name], [name] ([id number]) Visit Report for 07/16/2020 Arrival Information Details Patient Name: Date of Service: [name], [name] 07/16/2020 12:45 PM Medical Record Number: [id number] Patient Account Number: [id number] Date of Birth/Sex: Treating RN: Aug 13, 1965 (55 y.o. F) Primary Care Madoc Holquin: [place], Betty Other Clinician: Referring Fahed Morten: Treating Tajai Ihde/Extender: Robson, Michael [place], Betty Weeks in Treatment: 0 Visit Information History Since Last Visit Added or deleted any medications: No Patient Arrived: Ambulatory Any new allergies or adverse reactions: No Arrival Time: 13:02 Had a fall or experienced change in No Accompanied By: self activities of daily living that may affect Transfer Assistance: None risk of falls: Patient Identification Verified: Yes Signs or symptoms of abuse/neglect since last visito No Secondary Verification Process Completed: Yes Hospitalized since last visit: No Patient Requires Transmission-Based Precautions: No Implantable device outside of [facility] clinic excluding No Patient Has Alerts: No cellular tissue based products placed in [facility] center since last visit: Has Dressing in Place as Prescribed: Yes Pain Present Now: Yes Electronic Signature(s) Signed: 07/16/2020 4:42:14 PM By: Karl Ito Entered By: Karl Ito on 07/16/2020 13:03:35 -------------------------------------------------------------------------------- Compression Therapy Details Patient Name: Date of Service: [name], [name] 07/16/2020 12:45 PM Medical Record Number: [id number] Patient Account Number: [id number] Date of Birth/Sex: Treating RN: [birth year] (55 y.o. Arta Silence Primary Care Fusako Tanabe: [place], Betty Other Clinician: Referring Merton Wadlow: Treating Leigh Kaeding/Extender: Robson, Michael [place], Betty Weeks in Treatment: 0 Compression Therapy Performed for Wound Assessment: Wound #1 Left,Lateral Ankle Performed By: Clinician Shawn Stall,  RN Compression Type: Three Layer Electronic Signature(s) Signed: 07/16/2020 6:05:55 PM By: Shawn Stall Entered By: Shawn Stall on 07/16/2020 15:03:05 -------------------------------------------------------------------------------- Encounter Discharge Information Details Patient Name: Date of Service: [name], [name] 07/16/2020 12:45 PM Medical Record Number: [id number] Patient Account Number: [id number] Date of Birth/Sex: Treating RN: 01-Aug-1965 (55 y.o. Arta Silence Primary Care Kym Fenter: Other Clinician: [place], Betty Referring Jacquelynn Friend: Treating Nichola Cieslinski/Extender: Robson, Michael [place], Betty Weeks in Treatment: 0 Encounter Discharge Information Items Discharge Condition: Stable Ambulatory Status: Ambulatory Discharge Destination: Home Transportation: Private Auto Accompanied By: self Schedule Follow-up Appointment: Yes Clinical Summary of Care: Electronic Signature(s) Signed: 07/16/2020 6:05:55 PM By: Shawn Stall Entered By: Shawn Stall on 07/16/2020 15:34:49 -------------------------------------------------------------------------------- Pain Assessment Details Patient Name: Date of Service: [name], [name] 07/16/2020 12:45 PM Medical Record Number: [id number] Patient Account Number: [id number] Date of Birth/Sex: Treating RN: 27-Feb-1965 (55 y.o. Arta Silence Primary Care Dmarco Baldus: [place], Betty Other Clinician: Referring Tasheena Wambolt: Treating Jordan Pardini/Extender: Robson, Michael [place], Betty Weeks in Treatment: 0 Active Problems Location of Pain Severity and Description of Pain Patient Has Paino Yes Site Locations Pain Location: Generalized Pain Rate [place] pain. Current Pain Level: 5 Worst Pain Level: 10 Least Pain Level: 0 Tolerable Pain Level: 7 Pain Management and Medication Current Pain Management: Medication: No Cold Application: No Rest: No Massage: No Activity: No T.E.N.S.: No Heat Application: No Leg drop or elevation: No Is  [place] Current Pain Management Adequate: Adequate How does your wound impact your activities of daily livingo Sleep: No Bathing: No Appetite: No Relationship With Others: No Bladder Continence: No Emotions: No Bowel Continence: No Work: No Toileting: No Drive: No Dressing: No Hobbies: No Electronic Signature(s) Signed: 07/16/2020 6:05:55 PM By: Shawn Stall Entered By: Shawn Stall on 07/16/2020 15:32:54 -------------------------------------------------------------------------------- Patient/Caregiver Education Details Patient Name: Date of Service: [name] 6/14/2022andnbsp12:45 PM Medical Record Number: [id number] Patient Account Number: [id number] Date of Birth/Gender: Treating RN: May 15, 1965 (55 y.o. Arta Silence Primary Care Physician: [place], Betty Other Clinician: Referring Physician: Treating Physician/Extender: Robson, Michael [place],  Harrell Lark in Treatment: 0 Education Assessment Education Provided To: Patient Education Topics Provided Wound/Skin Impairment: Handouts: Skin Care Do's and Dont's Methods: Explain/Verbal Responses: Reinforcements needed Electronic Signature(s) Signed: 07/16/2020 6:05:55 PM By: Shawn Stall Entered By: Shawn Stall on 07/16/2020 15:34:12 -------------------------------------------------------------------------------- Wound Assessment Details Patient Name: Date of Service: [name], [name] 07/16/2020 12:45 PM Medical Record Number: [id number] Patient Account Number: [id number] Date of Birth/Sex: Treating RN: 02-Apr-1965 (55 y.o. F) Primary Care Brocha Gilliam: [place], Betty Other Clinician: Referring Siriyah Ambrosius: Treating Crestina Strike/Extender: Robson, Michael [place], Betty Weeks in Treatment: 0 Wound Status Wound Number: 1 Primary Etiology: Open Surgical Wound Wound Location: Left, Lateral Ankle Wound Status: Open Wounding Event: Surgical Injury Date Acquired: 02/15/2020 Weeks Of Treatment: 0 Clustered Wound:  No Wound Measurements Length: (cm) 3.8 Width: (cm) 1.5 Depth: (cm) 0.5 Area: (cm) 4.477 Volume: (cm) 2.238 % Reduction in Area: 0% % Reduction in Volume: 0% Wound Description Classification: Full Thickness Without Exposed Support Structur Treatment Notes Wound #1 (Ankle) Wound Laterality: Left, Lateral Cleanser Wound Cleanser Discharge Instruction: Cleanse [place] wound with wound balls. Soap and Water Discharge Instruction: May shower and wash wound wi es cleanser prior to applying a clean dressing using gauze sponges, not tissue or cotton th dial antibacterial soap and water prior to dressing change. Peri-Wound Care Sween Lotion (Moisturizing lotion) Discharge Instruction: Apply moisturizing lotion as directed Topical Primary Dressing IODOFLEX 0.9% Cadexomer Iodine Pad 4x6 cm Discharge Instruction: Apply to wound bed as instructed Secondary Dressing Woven Gauze Sponge, Non-Sterile 4x4 in Discharge Instruction: Apply over primary dressing as directed. ABD Pad, 5x9 Discharge Instruction: Apply over primary dressing as directed. Secured With Compression Wrap ThreePress (3 layer compression wrap) Discharge Instruction: Apply three layer compression as directed. Compression Stockings Add-Ons Notes foam at bend of foot and leg. Electronic Signature(s) Signed: 07/16/2020 4:42:14 PM By: Karl Ito Entered By: Karl Ito on 07/16/2020 13:04:10 -------------------------------------------------------------------------------- Vitals Details Patient Name: Date of Service: [name], [name] 07/16/2020 12:45 PM Medical Record Number: [id number] Patient Account Number: [id number] Date of Birth/Sex: Treating RN: [birth year] (55 y.o. F) Primary Care Aileana Hodder: [place], Betty Other Clinician: Referring Latrel Szymczak: Treating Zayleigh Stroh/Extender: Robson, Michael [place], Betty Weeks in Treatment: 0 Vital Signs Time Taken: 13:03 Temperature (F): 98.1 Height (in): 67 Pulse  (bpm): 86 Weight (lbs): 270 Respiratory Rate (breaths/min): 18 Body Mass Index (BMI): 42.3 Blood Pressure (mmHg): 167/94 Reference Range: 80 - 120 mg / dl Electronic Signature(s) Signed: 07/16/2020 4:42:14 PM By: Karl Ito Entered By: Karl Ito on 07/16/2020 13:04:01

## 2020-07-16 NOTE — Progress Notes (Signed)
PAULEEN, GOLEMAN ([id number]) Visit Report for 07/16/2020 SuperBill Details Patient Name: Date of Service: KOURTLYN, CHARLET 07/16/2020 Medical Record Number: [id number] Patient Account Number: [id number] Date of Birth/Sex: Treating RN: 1965/08/23 (55 y.o. Arta Silence Primary Care Provider: [place], Betty Other Clinician: Referring Provider: Treating Provider/Extender: Roshaunda Starkey [place], Betty Weeks in Treatment: 0 Diagnosis Coding ICD-10 Codes Code Description T81.31XD Disruption of external operation (surgical) wound, not elsewhere classified, subsequent encounter I87.312 Chronic venous hypertension (idiopathic) with ulcer of left lower extremity L97.828 Non-pressure chronic ulcer of other part of left lower leg with other specified severity Facility Procedures CPT4 Code Description Modifier Quantity [id number] (Facility Use Only) [phone] - APPLY MULTLAY COMPRS LWR LT LEG 1 Electronic Signature(s) Signed: 07/16/2020 4:29:11 PM By: Baltazar Najjar MD Signed: 07/16/2020 6:05:55 PM By: Shawn Stall Entered By: Shawn Stall on 07/16/2020 15:35:03

## 2020-07-19 ENCOUNTER — Encounter (HOSPITAL_BASED_OUTPATIENT_CLINIC_OR_DEPARTMENT_OTHER): Payer: 59 | Admitting: Internal Medicine

## 2020-07-19 ENCOUNTER — Other Ambulatory Visit: Payer: Self-pay

## 2020-07-19 DIAGNOSIS — L97828 Non-pressure chronic ulcer of other part of left lower leg with other specified severity: Secondary | ICD-10-CM | POA: Diagnosis not present

## 2020-07-19 NOTE — Progress Notes (Addendum)
NADELYN, ENRIQUES ([id number]) Visit Report for 07/19/2020 Arrival Information Details Patient Name: Date of Service: [place], Hull 07/19/2020 3:00 PM Medical Record Number: [id number] Patient Account Number: [id number] Date of Birth/Sex: Treating RN: 09-28-1965 (55 y.o. [name] Primary Care Kharis Lapenna: [place], Betty Other Clinician: Referring Brizeyda Holtmeyer: Treating Jonet Mathies/Extender: Robson, Michael [place], Betty Weeks in Treatment: 1 Visit Information History Since Last Visit Added or deleted any medications: No Patient Arrived: Ambulatory Any new allergies or adverse reactions: No Arrival Time: 15:58 Had a fall or experienced change in No Accompanied By: alone activities of daily living that may affect Transfer Assistance: None risk of falls: Patient Identification Verified: Yes Signs or symptoms of abuse/neglect since last visito No Secondary Verification Process Completed: Yes Hospitalized since last visit: No Patient Requires Transmission-Based Precautions: No Implantable device outside of the clinic excluding No Patient Has Alerts: No cellular tissue based products placed in the center since last visit: Has Dressing in Place as Prescribed: Yes Has Compression in Place as Prescribed: Yes Pain Present Now: No Electronic Signature(s) Signed: 07/22/2020 5:37:12 PM By: Zandra Abts RN, BSN Entered By: Zandra Abts on 07/19/2020 15:58:39 -------------------------------------------------------------------------------- Compression Therapy Details Patient Name: Date of Service: CO Hull, [place]raine 07/19/2020 3:00 PM Medical Record Number: [id number] Patient Account Number: [id number] Date of Birth/Sex: Treating RN: 1965/02/14 (55 y.o. [name] Primary Care Wildon Cuevas: [place], Betty Other Clinician: Referring Floy Angert: Treating Ladelle Teodoro/Extender: Robson, Michael [place], Betty Weeks in Treatment: 1 Compression Therapy Performed for Wound Assessment: Wound #1  Left,Lateral Ankle Performed By: Clinician Antonieta Iba, RN Compression Type: Three Layer Post Procedure Diagnosis Same as Pre-procedure Electronic Signature(s) Signed: 07/19/2020 5:38:00 PM By: Antonieta Iba Entered By: Antonieta Iba on 07/19/2020 16:52:33 -------------------------------------------------------------------------------- Lower Extremity Assessment Details Patient Name: Date of Service: CO Hull, [place]raine 07/19/2020 3:00 PM Medical Record Number: [id number] Patient Account Number: [id number] Date of Birth/Sex: Treating RN: [birth year] (55 y.o. [name] Primary Care Keyaan Lederman: [place], Betty Other Clinician: Referring Hiawatha Dressel: Treating Destin Vinsant/Extender: Robson, Michael [place], Betty Weeks in Treatment: 1 Edema Assessment Assessed: Kyra Searles: No] [Right: No] Edema: [Left: Ye] [Right: s] Calf Left: Right: Point of Measurement: 36 cm From Medial Instep 32.5 cm Ankle Left: Right: Point of Measurement: 11 cm From Medial Instep 26 cm Vascular Assessment Pulses: Dorsalis Pedis Palpable: [Left:Yes] Electronic Signature(s) Signed: 07/22/2020 5:37:12 PM By: Zandra Abts RN, BSN Entered By: Zandra Abts on 07/19/2020 16:09:11 -------------------------------------------------------------------------------- Multi Wound Chart Details Patient Name: Date of Service: CO Hull, Lorraine 07/19/2020 3:00 PM Medical Record Number: [id number] Patient Account Number: [id number] Date of Birth/Sex: Treating RN: [birth year] (55 y.o. [name] Primary Care Racheal Mathurin: [place], Betty Other Clinician: Referring Mackenize Delgadillo: Treating Dario Yono/Extender: Robson, Michael [place], Betty Weeks in Treatment: 1 Vital Signs Height(in): 67 Pulse(bpm): 76 Weight(lbs): 270 Blood Pressure(mmHg): 151/85 Body Mass Index(BMI): 42 Temperature(F): 99.3 Respiratory Rate(breaths/min): 16 Photos: [1:No Photos Left, Lateral Ankle] [N/A:N/A N/A] Wound Location: [1:Surgical Injury]  [N/A:N/A] Wounding Event: [1:Open Surgical Wound] [N/A:N/A] Primary Etiology: [1:Anemia, Asthma] [N/A:N/A] Comorbid History: [1:02/15/2020] [N/A:N/A] Date Acquired: [1:1] [N/A:N/A] Weeks of Treatment: [1:Open] [N/A:N/A] Wound Status: [1:4.8x1.5x0.3] [N/A:N/A] Measurements L x W x D (cm) [1:5.655] [N/A:N/A] A (cm) : rea [1:1.696] [N/A:N/A] Volume (cm) : [1:-26.30%] [N/A:N/A] % Reduction in Area: [1:24.20%] [N/A:N/A] % Reduction in Volume: [1:Full Thickness Without Exposed] [N/A:N/A] Classification: [1:Support Structures Medium] [N/A:N/A] Exudate A mount: [1:Serosanguineous] [N/A:N/A] Exudate Type: [1:red, brown] [N/A:N/A] Exudate Color: [1:Flat and Intact] [N/A:N/A] Wound Margin: [1:Medium (34-66%)] [N/A:N/A] Granulation A mount: [1:Red] [N/A:N/A] Granulation Quality: [1:Medium (34-66%)] [N/A:N/A] Necrotic A mount: [1:Fat Layer (  Subcutaneous Tissue): Yes N/A] Exposed Structures: [1:Fascia: No Tendon: No Muscle: No Joint: No Bone: No Small (1-33%)] [N/A:N/A] Epithelialization: [1:Debridement - Excisional] [N/A:N/A] Debridement: Pre-procedure Verification/Time Out 16:44 [N/A:N/A] Taken: [1:Other] [N/A:N/A] Pain Control: [1:Subcutaneous] [N/A:N/A] Tissue Debrided: [1:Skin/Subcutaneous Tissue] [N/A:N/A] Level: [1:7.2] [N/A:N/A] Debridement A (sq cm): [1:rea Curette] [N/A:N/A] Instrument: [1:Minimum] [N/A:N/A] Bleeding: [1:Pressure] [N/A:N/A] Hemostasis A chieved: [1:Procedure was tolerated well] [N/A:N/A] Debridement Treatment Response: [1:4.8x1.5x0.3] [N/A:N/A] Post Debridement Measurements L x W x D (cm) [1:1.696] [N/A:N/A] Post Debridement Volume: (cm) [1:Compression Therapy] [N/A:N/A] Procedures Performed: [1:Debridement] Treatment Notes Electronic Signature(s) Signed: 07/19/2020 5:27:23 PM By: Baltazar Najjar MD Signed: 07/19/2020 5:38:00 PM By: Antonieta Iba Entered By: Baltazar Najjar on 07/19/2020  17:09:32 -------------------------------------------------------------------------------- Multi-Disciplinary Care Plan Details Patient Name: Date of Service: CO Hull, [place]raine 07/19/2020 3:00 PM Medical Record Number: [id number] Patient Account Number: [id number] Date of Birth/Sex: Treating RN: 01/24/1966 (55 y.o. [name] Primary Care Galya Dunnigan: [place], Betty Other Clinician: Referring Faylynn Stamos: Treating Solace Wendorff/Extender: Robson, Michael [place], Betty Weeks in Treatment: 1 Active Inactive Orientation to the Wound Care Program Nursing Diagnoses: Knowledge deficit related to the wound healing center program Goals: Patient/caregiver will verbalize understanding of the Wound Healing Center Program Date Initiated: 07/12/2020 Target Resolution Date: 08/09/2020 Goal Status: Active Interventions: Provide education on orientation to the wound center Notes: Wound/Skin Impairment Nursing Diagnoses: Impaired tissue integrity Goals: Patient/caregiver will verbalize understanding of skin care regimen Date Initiated: 07/12/2020 Target Resolution Date: 08/09/2020 Goal Status: Active Ulcer/skin breakdown will have a volume reduction of 30% by week 4 Date Initiated: 07/12/2020 Target Resolution Date: 08/09/2020 Goal Status: Active Interventions: Assess patient/caregiver ability to obtain necessary supplies Assess patient/caregiver ability to perform ulcer/skin care regimen upon admission and as needed Assess ulceration(s) every visit Provide education on ulcer and skin care Treatment Activities: Skin care regimen initiated : 07/12/2020 Topical wound management initiated : 07/12/2020 Notes: Electronic Signature(s) Signed: 07/19/2020 3:57:55 PM By: Antonieta Iba Entered By: Antonieta Iba on 07/19/2020 15:57:55 -------------------------------------------------------------------------------- Pain Assessment Details Patient Name: Date of Service: CO Hull, [place]raine 07/19/2020 3:00  PM Medical Record Number: [id number] Patient Account Number: [id number] Date of Birth/Sex: Treating RN: [birth year] (55 y.o. [name] Primary Care Jerl Munyan: [place], Betty Other Clinician: Referring Gari Trovato: Treating Candon Caras/Extender: Robson, Michael [place], Betty Weeks in Treatment: 1 Active Problems Location of Pain Severity and Description of Pain Patient Has Paino No Site Locations Pain Management and Medication Current Pain Management: Medication: Yes Cold Application: No Rest: No Massage: No Activity: No T.E.N.S.: No Heat Application: No Leg drop or elevation: No Is the Current Pain Management Adequate: Adequate Electronic Signature(s) Signed: 07/22/2020 5:37:12 PM By: Zandra Abts RN, BSN Entered By: Zandra Abts on 07/19/2020 16:08:36 -------------------------------------------------------------------------------- Patient/Caregiver Education Details Patient Name: Date of Service: CO Hull, [place]raine 6/17/2022andnbsp3:00 PM Medical Record Number: [id number] Patient Account Number: [id number] Date of Birth/Gender: Treating RN: [birth year] (55 y.o. [name] Primary Care Physician: [place], Betty Other Clinician: Referring Physician: Treating Physician/Extender: Robson, Michael [place], Betty Weeks in Treatment: 1 Education Assessment Education Provided To: Patient Education Topics Provided Venous: Methods: Explain/Verbal, Printed Responses: State content correctly Wound/Skin Impairment: Methods: Explain/Verbal, Printed Responses: State content correctly Electronic Signature(s) Signed: 07/19/2020 5:38:00 PM By: Antonieta Iba Entered By: Antonieta Iba on 07/19/2020 16:48:55 -------------------------------------------------------------------------------- Wound Assessment Details Patient Name: Date of Service: CO Hull, [place]raine 07/19/2020 3:00 PM Medical Record Number: [id number] Patient Account Number: [id number] Date of  Birth/Sex: Treating RN: 11-14-1965 (55 y.o. [name] Primary Care Amonda Brillhart: [place], Betty Other Clinician: Referring Kallen Delatorre: Treating Sarthak Rubenstein/Extender: Robson, Michael [place],  Betty Weeks in Treatment: 1 Wound Status Wound Number: 1 Primary Etiology: Open Surgical Wound Wound Location: Left, Lateral Ankle Wound Status: Open Wounding Event: Surgical Injury Comorbid History: Anemia, Asthma Date Acquired: 02/15/2020 Weeks Of Treatment: 1 Clustered Wound: No Photos Wound Measurements Length: (cm) 4.8 Width: (cm) 1.5 Depth: (cm) 0.3 Area: (cm) 5.655 Volume: (cm) 1.696 % Reduction in Area: -26.3% % Reduction in Volume: 24.2% Epithelialization: Small (1-33%) Tunneling: No Undermining: No Wound Description Classification: Full Thickness Without Exposed Support Structures Wound Margin: Flat and Intact Exudate Amount: Medium Exudate Type: Serosanguineous Exudate Color: red, brown Foul Odor After Cleansing: No Slough/Fibrino Yes Wound Bed Granulation Amount: Medium (34-66%) Exposed Structure Granulation Quality: Red Fascia Exposed: No Necrotic Amount: Medium (34-66%) Fat Layer (Subcutaneous Tissue) Exposed: Yes Necrotic Quality: Adherent Slough Tendon Exposed: No Muscle Exposed: No Joint Exposed: No Bone Exposed: No Electronic Signature(s) Signed: 07/22/2020 5:37:12 PM By: Zandra Abts RN, BSN Signed: 07/25/2020 8:38:46 AM By: Rolan Lipa Entered By: Rolan Lipa on 07/22/2020 15:04:24 -------------------------------------------------------------------------------- Vitals Details Patient Name: Date of Service: CO Hull, [place]raine 07/19/2020 3:00 PM Medical Record Number: [id number] Patient Account Number: [id number] Date of Birth/Sex: Treating RN: 11/12/1965 (55 y.o. [name] Primary Care Edrick Whitehorn: [place], Betty Other Clinician: Referring Savonna Birchmeier: Treating Kairie Vangieson/Extender: Robson, Michael [place], Betty Weeks in Treatment:  1 Vital Signs Time Taken: 15:58 Temperature (F): 99.3 Height (in): 67 Pulse (bpm): 76 Weight (lbs): 270 Respiratory Rate (breaths/min): 16 Body Mass Index (BMI): 42.3 Blood Pressure (mmHg): 151/85 Reference Range: 80 - 120 mg / dl Electronic Signature(s) Signed: 07/22/2020 5:37:12 PM By: Zandra Abts RN, BSN Entered By: Zandra Abts on 07/19/2020 16:08:03

## 2020-07-19 NOTE — Progress Notes (Signed)
[name], [name] ([id number]) Visit Report for 07/19/2020 Debridement Details Patient Name: Date of Service: [name], Hull 07/19/2020 3:00 PM Medical Record Number: [id number] Patient Account Number: [id number] Date of Birth/Sex: Treating RN: 1965/11/21 (55 y.o. [name] Primary Care Provider: [place], Betty Other Clinician: Referring Provider: Treating Provider/Extender: Jameon Deller [place], Betty Weeks in Treatment: 1 Debridement Performed for Assessment: Wound #1 Left,Lateral Ankle Performed By: Physician Maxwell Caul., MD Debridement Type: Debridement Level of Consciousness (Pre-procedure): Awake and Alert Pre-procedure Verification/Time Out Yes - 16:44 Taken: Start Time: 16:45 Pain Control: Other : Benzocaine T Area Debrided (L x W): otal 4.8 (cm) x 1.5 (cm) = 7.2 (cm) Tissue and other material debrided: Non-Viable, Subcutaneous Level: Skin/Subcutaneous Tissue Debridement Description: Excisional Instrument: Curette Bleeding: Minimum Hemostasis Achieved: Pressure End Time: 16:51 Response to Treatment: Procedure was tolerated well Level of Consciousness (Post- Awake and Alert procedure): Post Debridement Measurements of Total Wound Length: (cm) 4.8 Width: (cm) 1.5 Depth: (cm) 0.3 Volume: (cm) 1.696 Character of Wound/Ulcer Post Debridement: Stable Post Procedure Diagnosis Same as Pre-procedure Electronic Signature(s) Signed: 07/19/2020 5:27:23 PM By: Baltazar Najjar MD Signed: 07/19/2020 5:38:00 PM By: Antonieta Iba Entered By: Baltazar Najjar on 07/19/2020 17:09:43 -------------------------------------------------------------------------------- HPI Details Patient Name: Date of Service: [name], Lorraine 07/19/2020 3:00 PM Medical Record Number: [id number] Patient Account Number: [id number] Date of Birth/Sex: Treating RN: 03-14-1965 (55 y.o. [name] Primary Care Provider: [place], Betty Other Clinician: Referring Provider: Treating  Provider/Extender: Amaury Kuzel [place], Betty Weeks in Treatment: 1 History of Present Illness HPI Description: ADMISSION 07/12/2020; This is a otherwise well 55 year old woman who suffered a fall on the ice in January. She developed a left ankle fibular fracture requiring an ORIF on February 15, 2020. She is followed by Dr. Margarita Rana and she has been using wet-to-dry dressings for a long period now however the wound is only slowly contracting and she was referred for our review of this. 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 She has some edema in her lower extremities and she uses some form of compression stocking. Past medical history includes hypothyroidism, irritable bowel syndrome, anemia. She may have some degree of chronic venous insufficiency necessitating use of compression stockings. She is not a diabetic ABI in the left leg in our clinic was noncompressible however her peripheral pulses are easily palpable 6/17; Wound is healthier looking but still non viable surface under illumination. using iodoflex Electronic Signature(s) Signed: 07/19/2020 5:27:23 PM By: Baltazar Najjar MD Entered By: Baltazar Najjar on 07/19/2020 17:12:41 -------------------------------------------------------------------------------- Physical Exam Details Patient Name: Date of Service: [name], Lorraine 07/19/2020 3:00 PM Medical Record Number: [id number] Patient Account Number: [id number] Date of Birth/Sex: Treating RN: 04/21/1965 (55 y.o. [name] Primary Care Provider: [place], Betty Other Clinician: Referring Provider: Treating Provider/Extender: Kaysen Deal [place], Betty Weeks in Treatment: 1 Constitutional Patient is hypertensive.. Pulse regular and within target range for patient.Marland Kitchen Respirations regular, non-labored and within target range.. Temperature is normal and within the target range for the patient.Marland Kitchen Appears in no  distress. Cardiovascular Pedal pulses absent bilaterally.. Notes Exam; the eschared circumference is better since last week. Under illumination still some debris on the surface gentle debridement with a #5 curette removing subcutaneous tissue. Her edema control is better Electronic Signature(s) Signed: 07/19/2020 5:27:23 PM By: Baltazar Najjar MD Entered By: Baltazar Najjar on 07/19/2020 17:17:21 -------------------------------------------------------------------------------- Physician Orders Details Patient Name: Date of Service: [name], Lorraine 07/19/2020 3:00 PM Medical Record Number:  [id number] Patient Account Number: [id number] Date of Birth/Sex: Treating RN: 07/12/1965 (55 y.o. [name] Primary Care Provider: [place], Betty Other Clinician: Referring Provider: Treating Provider/Extender: Kutter Schnepf [place], Betty Weeks in Treatment: 1 Verbal / Phone Orders: No Diagnosis Coding ICD-10 Coding Code Description T81.31XD Disruption of external operation (surgical) wound, not elsewhere classified, subsequent encounter I87.312 Chronic venous hypertension (idiopathic) with ulcer of left lower extremity L97.828 Non-pressure chronic ulcer of other part of left lower leg with other specified severity Follow-up Appointments ppointment in 1 week. - with Dr. Leanord Hawking Return A Cellular or Tissue Based Products Cellular or Tissue Based Product Type: - 07/19/20: Apligraf: Run insurance benefits Bathing/ Shower/ Hygiene May shower with protection but do not get wound dressing(s) wet. Edema Control - Lymphedema / SCD / Other Elevate legs to the level of the heart or above for 30 minutes daily and/or when sitting, a frequency of: Avoid standing for long periods of time. Additional Orders / Instructions Follow Nutritious Diet - Increase protein Wound Treatment Wound #1 - Ankle Wound Laterality: Left, Lateral Cleanser: Soap and Water 1 x Per Week/30 Days Discharge Instructions:  May shower and wash wound with dial antibacterial soap and water prior to dressing change. Cleanser: Wound Cleanser 1 x Per Week/30 Days Discharge Instructions: Cleanse the wound with wound cleanser prior to applying a clean dressing using gauze sponges, not tissue or cotton balls. Peri-Wound Care: Sween Lotion (Moisturizing lotion) 1 x Per Week/30 Days Discharge Instructions: Apply moisturizing lotion as directed Prim Dressing: IODOFLEX 0.9% Cadexomer Iodine Pad 4x6 cm 1 x Per Week/30 Days ary Discharge Instructions: Apply to wound bed as instructed Secondary Dressing: Woven Gauze Sponge, Non-Sterile 4x4 in 1 x Per Week/30 Days Discharge Instructions: Apply over primary dressing as directed. Secondary Dressing: ABD Pad, 5x9 1 x Per Week/30 Days Discharge Instructions: Apply over primary dressing as directed. Compression Wrap: ThreePress (3 layer compression wrap) 1 x Per Week/30 Days Discharge Instructions: Apply three layer compression as directed. Electronic Signature(s) Signed: 07/19/2020 5:27:23 PM By: Baltazar Najjar MD Signed: 07/19/2020 5:38:00 PM By: Antonieta Iba Previous Signature: 07/19/2020 3:57:47 PM Version By: Antonieta Iba Entered By: Antonieta Iba on 07/19/2020 16:53:22 -------------------------------------------------------------------------------- Problem List Details Patient Name: Date of Service: [name], Anija 07/19/2020 3:00 PM Medical Record Number: [id number] Patient Account Number: [id number] Date of Birth/Sex: Treating RN: 1966-02-02 (56 y.o. [name] Primary Care Provider: [place], Betty Other Clinician: Referring Provider: Treating Provider/Extender: Malee Grays [place], Betty Weeks in Treatment: 1 Active Problems ICD-10 Encounter Code Description Active Date MDM Diagnosis T81.31XD Disruption of external operation (surgical) wound, not elsewhere classified, 07/12/2020 No Yes subsequent encounter I87.312 Chronic venous hypertension  (idiopathic) with ulcer of left lower extremity 07/12/2020 No Yes L97.828 Non-pressure chronic ulcer of other part of left lower leg with other specified 07/12/2020 No Yes severity Inactive Problems Resolved Problems Electronic Signature(s) Signed: 07/19/2020 3:57:27 PM By: Antonieta Iba Signed: 07/19/2020 5:27:23 PM By: Baltazar Najjar MD Entered By: Antonieta Iba on 07/19/2020 15:57:27 -------------------------------------------------------------------------------- Progress Note Details Patient Name: Date of Service: [name], Abigail 07/19/2020 3:00 PM Medical Record Number: [id number] Patient Account Number: [id number] Date of Birth/Sex: Treating RN: 02-13-1965 (54 y.o. [name] Primary Care Provider: [place], Betty Other Clinician: Referring Provider: Treating Provider/Extender: Kenzy Campoverde [place], Betty Weeks in Treatment: 1 Subjective History of Present Illness (HPI) ADMISSION 07/12/2020; This is a otherwise well 55 year old woman who suffered a fall on the ice in January. She developed a left ankle fibular fracture requiring an ORIF on February 15, 2020. She is followed by Dr. Margarita Ranaimothy Murphy and she has been using wet-to-dry dressings for a long period now however the wound is only slowly contracting and she was referred for our review of this. 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 She has some edema in her lower extremities and she uses some form of compression stocking. Past medical history includes hypothyroidism, irritable bowel syndrome, anemia. She may have some degree of chronic venous insufficiency necessitating use of compression stockings. She is not a diabetic ABI in the left leg in our clinic was noncompressible however her peripheral pulses are easily palpable 6/17; Wound is healthier looking but still non viable surface under illumination. using iodoflex Objective Constitutional Patient is hypertensive.. Pulse  regular and within target range for patient.Marland Kitchen. Respirations regular, non-labored and within target range.. Temperature is normal and within the target range for the patient.Marland Kitchen. Appears in no distress. Vitals Time Taken: 3:58 PM, Height: 67 in, Weight: 270 lbs, BMI: 42.3, Temperature: 99.3 F, Pulse: 76 bpm, Respiratory Rate: 16 breaths/min, Blood Pressure: 151/85 mmHg. Cardiovascular Pedal pulses absent bilaterally.. General Notes: Exam; the eschared circumference is better since last week. Under illumination still some debris on the surface gentle debridement with a #5 curette removing subcutaneous tissue. Her edema control is better Integumentary (Hair, Skin) Wound #1 status is Open. Original cause of wound was Surgical Injury. The date acquired was: 02/15/2020. The wound has been in treatment 1 weeks. The wound is located on the Left,Lateral Ankle. The wound measures 4.8cm length x 1.5cm width x 0.3cm depth; 5.655cm^2 area and 1.696cm^3 volume. There is Fat Layer (Subcutaneous Tissue) exposed. There is no tunneling or undermining noted. There is a medium amount of serosanguineous drainage noted. The wound margin is flat and intact. There is medium (34-66%) red granulation within the wound bed. There is a medium (34-66%) amount of necrotic tissue within the wound bed including Adherent Slough. Assessment Active Problems ICD-10 Disruption of external operation (surgical) wound, not elsewhere classified, subsequent encounter Chronic venous hypertension (idiopathic) with ulcer of left lower extremity Non-pressure chronic ulcer of other part of left lower leg with other specified severity Procedures Wound #1 Pre-procedure diagnosis of Wound #1 is an Open Surgical Wound located on the Left,Lateral Ankle . There was a Excisional Skin/Subcutaneous Tissue Debridement with a total area of 7.2 sq cm performed by Maxwell Caulobson, Keita Demarco G., MD. With the following instrument(s): Curette to remove  Non-Viable tissue/material. Material removed includes Subcutaneous Tissue after achieving pain control using Other (Benzocaine). No specimens were taken. A time out was conducted at 16:44, prior to the start of the procedure. A Minimum amount of bleeding was controlled with Pressure. The procedure was tolerated well. Post Debridement Measurements: 4.8cm length x 1.5cm width x 0.3cm depth; 1.696cm^3 volume. Character of Wound/Ulcer Post Debridement is stable. Post procedure Diagnosis Wound #1: Same as Pre-Procedure Pre-procedure diagnosis of Wound #1 is an Open Surgical Wound located on the Left,Lateral Ankle . There was a Three Layer Compression Therapy Procedure by Antonieta IbaBarnhart, Jodi, RN. Post procedure Diagnosis Wound #1: Same as Pre-Procedure Plan Follow-up Appointments: Return Appointment in 1 week. - with Dr. Leanord Hawkingobson Cellular or Tissue Based Products: Cellular or Tissue Based Product Type: - 07/19/20: Apligraf: Run insurance benefits Bathing/ Shower/ Hygiene: May shower with protection but do not get wound dressing(s) wet. Edema Control - Lymphedema / SCD / Other: Elevate legs to the level of the heart or above for 30 minutes daily  and/or when sitting, a frequency of: Avoid standing for long periods of time. Additional Orders / Instructions: Follow Nutritious Diet - Increase protein WOUND #1: - Ankle Wound Laterality: Left, Lateral Cleanser: Soap and Water 1 x Per Week/30 Days Discharge Instructions: May shower and wash wound with dial antibacterial soap and water prior to dressing change. Cleanser: Wound Cleanser 1 x Per Week/30 Days Discharge Instructions: Cleanse the wound with wound cleanser prior to applying a clean dressing using gauze sponges, not tissue or cotton balls. Peri-Wound Care: Sween Lotion (Moisturizing lotion) 1 x Per Week/30 Days Discharge Instructions: Apply moisturizing lotion as directed Prim Dressing: IODOFLEX 0.9% Cadexomer Iodine Pad 4x6 cm 1 x Per Week/30  Days ary Discharge Instructions: Apply to wound bed as instructed Secondary Dressing: Woven Gauze Sponge, Non-Sterile 4x4 in 1 x Per Week/30 Days Discharge Instructions: Apply over primary dressing as directed. Secondary Dressing: ABD Pad, 5x9 1 x Per Week/30 Days Discharge Instructions: Apply over primary dressing as directed. Com pression Wrap: ThreePress (3 layer compression wrap) 1 x Per Week/30 Days Discharge Instructions: Apply three layer compression as directed. 1. I am continuing the Iodoflex trying to get the surface healthier. 2. Aim to change to silver collagen next week 3. I gave her tramadol 50 mg 4 times daily as needed 4. I am going to put Apligraf through her insurance although she is not ready for this yet. 5. The whole thing is complicated by the fact she works at [employer] and is on her feet all day but there does not seem to be any way around this. Electronic Signature(s) Signed: 07/19/2020 5:27:23 PM By: Baltazar Najjar MD Entered By: Baltazar Najjar on 07/19/2020 17:18:13 -------------------------------------------------------------------------------- SuperBill Details Patient Name: Date of Service: [name], Branden 07/19/2020 Medical Record Number: [id number] Patient Account Number: [id number] Date of Birth/Sex: Treating RN: 1965-06-06 (55 y.o. [name] Primary Care Provider: [place], Betty Other Clinician: Referring Provider: Treating Provider/Extender: Kimbery Harwood [place], Betty Weeks in Treatment: 1 Diagnosis Coding ICD-10 Codes Code Description T81.31XD Disruption of external operation (surgical) wound, not elsewhere classified, subsequent encounter I87.312 Chronic venous hypertension (idiopathic) with ulcer of left lower extremity L97.828 Non-pressure chronic ulcer of other part of left lower leg with other specified severity Facility Procedures CPT4 Code: [id number] Description: 11042 - DEB SUBQ TISSUE 20 SQ CM/< ICD-10 Diagnosis Description  L97.828 Non-pressure chronic ulcer of other part of left lower leg with other specified Modifier: severity Quantity: 1 Physician Procedures : CPT4 Code Description Modifier [id number] 11042 - WC PHYS SUBQ TISS 20 SQ CM ICD-10 Diagnosis Description L97.828 Non-pressure chronic ulcer of other part of left lower leg with other specified severity Quantity: 1 Electronic Signature(s) Signed: 07/19/2020 5:27:23 PM By: Baltazar Najjar MD Entered By: Baltazar Najjar on 07/19/2020 17:18:30

## 2020-07-20 ENCOUNTER — Other Ambulatory Visit: Payer: Self-pay | Admitting: Family Medicine

## 2020-07-20 DIAGNOSIS — E032 Hypothyroidism due to medicaments and other exogenous substances: Secondary | ICD-10-CM

## 2020-07-26 ENCOUNTER — Other Ambulatory Visit: Payer: Self-pay

## 2020-07-26 ENCOUNTER — Encounter (HOSPITAL_BASED_OUTPATIENT_CLINIC_OR_DEPARTMENT_OTHER): Payer: 59 | Admitting: Internal Medicine

## 2020-07-26 ENCOUNTER — Other Ambulatory Visit: Payer: Self-pay | Admitting: Family Medicine

## 2020-07-26 DIAGNOSIS — Z1231 Encounter for screening mammogram for malignant neoplasm of breast: Secondary | ICD-10-CM

## 2020-07-26 DIAGNOSIS — L97828 Non-pressure chronic ulcer of other part of left lower leg with other specified severity: Secondary | ICD-10-CM | POA: Diagnosis not present

## 2020-07-26 NOTE — Progress Notes (Signed)
ANH, BIGOS ([id number]) Visit Report for 07/26/2020 Arrival Information Details Patient Name: Date of Service: [name], [name] 07/26/2020 3:00 PM Medical Record Number: [id number] Patient Account Number: [id number] Date of Birth/Sex: Treating RN: May 13, 1965 (55 y.o. Ardis Rowan, [name] Primary Care Hanae Waiters: [place], Betty Other Clinician: Referring Rashena Dowling: Treating Roshun Klingensmith/Extender: Robson, Michael [place], Betty Weeks in Treatment: 2 Visit Information History Since Last Visit Added or deleted any medications: No Patient Arrived: Ambulatory Any new allergies or adverse reactions: No Arrival Time: 16:01 Had a fall or experienced change in No Accompanied By: self activities of daily living that may affect Transfer Assistance: None risk of falls: Patient Identification Verified: Yes Signs or symptoms of abuse/neglect since last visito No Secondary Verification Process Completed: Yes Hospitalized since last visit: No Patient Requires Transmission-Based Precautions: No Implantable device outside of the clinic excluding No Patient Has Alerts: No cellular tissue based products placed in the center since last visit: Has Dressing in Place as Prescribed: Yes Pain Present Now: Yes Electronic Signature(s) Signed: 07/26/2020 6:18:32 PM By: Fonnie Mu RN Entered By: Fonnie Mu on 07/26/2020 16:01:56 -------------------------------------------------------------------------------- Compression Therapy Details Patient Name: Date of Service: [name], [name] 07/26/2020 3:00 PM Medical Record Number: [id number] Patient Account Number: [id number] Date of Birth/Sex: Treating RN: 12-Nov-1965 (55 y.o. Toniann Fail Primary Care Perris Conwell: [place], Betty Other Clinician: Referring Nakiesha Rumsey: Treating Eufemio Strahm/Extender: Robson, Michael [place], Betty Weeks in Treatment: 2 Compression Therapy Performed for Wound Assessment: Wound #1 Left,Lateral Ankle Performed By:  Clinician Shawn Stall, RN Compression Type: Three Layer Post Procedure Diagnosis Same as Pre-procedure Electronic Signature(s) Signed: 07/26/2020 6:18:32 PM By: Fonnie Mu RN Entered By: Fonnie Mu on 07/26/2020 16:51:57 -------------------------------------------------------------------------------- Encounter Discharge Information Details Patient Name: Date of Service: [name], [name] 07/26/2020 3:00 PM Medical Record Number: [id number] Patient Account Number: [id number] Date of Birth/Sex: Treating RN: Jan 26, 1966 (55 y.o. Arta Silence Primary Care Phynix Horton: [place], Betty Other Clinician: Referring Asriel Westrup: Treating Jamine Wingate/Extender: Robson, Michael [place], Betty Weeks in Treatment: 2 Encounter Discharge Information Items Post Procedure Vitals Discharge Condition: Stable Temperature (F): 98.4 Ambulatory Status: Ambulatory Pulse (bpm): 94 Discharge Destination: Home Respiratory Rate (breaths/min): 17 Transportation: Private Auto Blood Pressure (mmHg): 166/94 Accompanied By: self Schedule Follow-up Appointment: Yes Clinical Summary of Care: Electronic Signature(s) Signed: 07/26/2020 6:02:43 PM By: Shawn Stall Entered By: Shawn Stall on 07/26/2020 18:02:28 -------------------------------------------------------------------------------- Lower Extremity Assessment Details Patient Name: Date of Service: [name], [name] 07/26/2020 3:00 PM Medical Record Number: [id number] Patient Account Number: [id number] Date of Birth/Sex: Treating RN: 1965-07-07 (55 y.o. Ardis Rowan, [name] Primary Care Shakeila Pfarr: [place], Betty Other Clinician: Referring Karlton Maya: Treating Bibiana Gillean/Extender: Robson, Michael [place], Betty Weeks in Treatment: 2 Edema Assessment Assessed: Kyra Searles: Yes] [Right: No] Edema: [Left: Ye] [Right: s] Calf Left: Right: Point of Measurement: 36 cm From Medial Instep 32.5 cm Ankle Left: Right: Point of Measurement: 11 cm From Medial  Instep 26 cm Vascular Assessment Pulses: Dorsalis Pedis Palpable: [Left:Yes] Posterior Tibial Palpable: [Left:Yes] Electronic Signature(s) Signed: 07/26/2020 6:18:32 PM By: Fonnie Mu RN Entered By: Fonnie Mu on 07/26/2020 16:11:24 -------------------------------------------------------------------------------- Multi Wound Chart Details Patient Name: Date of Service: [name], [name] 07/26/2020 3:00 PM Medical Record Number: [id number] Patient Account Number: [id number] Date of Birth/Sex: Treating RN: August 02, 1965 (55 y.o. Tommye Standard Primary Care Teigan Sahli: [place], Betty Other Clinician: Referring Mane Consolo: Treating Khyrie Masi/Extender: Robson, Michael [place], Betty Weeks in Treatment: 2 Vital Signs Height(in): 67 Pulse(bpm): 94 Weight(lbs): 270 Blood Pressure(mmHg): 166/94 Body Mass Index(BMI): 42 Temperature(F): 98.4 Respiratory Rate(breaths/min): 17 Photos: [N/A:N/A] Left, Lateral Ankle N/A N/A  Wound Location: Surgical Injury N/A N/A Wounding Event: Open Surgical Wound N/A N/A Primary Etiology: Anemia, Asthma N/A N/A Comorbid History: 02/15/2020 N/A N/A Date Acquired: 2 N/A N/A Weeks of Treatment: Open N/A N/A Wound Status: 3.7x1.2x0.3 N/A N/A Measurements L x W x D (cm) 3.487 N/A N/A A (cm) : rea 1.046 N/A N/A Volume (cm) : 22.10% N/A N/A % Reduction in A rea: 53.30% N/A N/A % Reduction in Volume: Full Thickness Without Exposed N/A N/A Classification: Support Structures Medium N/A N/A Exudate A mount: Serosanguineous N/A N/A Exudate Type: red, brown N/A N/A Exudate Color: Distinct, outline attached N/A N/A Wound Margin: Large (67-100%) N/A N/A Granulation A mount: Red, Pink N/A N/A Granulation Quality: Small (1-33%) N/A N/A Necrotic A mount: Fat Layer (Subcutaneous Tissue): Yes N/A N/A Exposed Structures: Fascia: No Tendon: No Muscle: No Joint: No Bone: No None N/A N/A Epithelialization: Debridement -  Excisional N/A N/A Debridement: Pre-procedure Verification/Time Out 16:45 N/A N/A Taken: Other N/A N/A Pain Control: Subcutaneous, Slough N/A N/A Tissue Debrided: Skin/Subcutaneous Tissue N/A N/A Level: 4.44 N/A N/A Debridement A (sq cm): rea Curette N/A N/A Instrument: Minimum N/A N/A Bleeding: Pressure N/A N/A Hemostasis A chieved: 5 N/A N/A Procedural Pain: 3 N/A N/A Post Procedural Pain: Procedure was tolerated well N/A N/A Debridement Treatment Response: 3.7x1.2x0.3 N/A N/A Post Debridement Measurements L x W x D (cm) 1.046 N/A N/A Post Debridement Volume: (cm) Compression Therapy N/A N/A Procedures Performed: Debridement Treatment Notes Electronic Signature(s) Signed: 07/26/2020 5:20:40 PM By: Baltazar Najjarobson, Michael MD Signed: 07/26/2020 6:17:32 PM By: Zenaida DeedBoehlein, Linda RN, BSN Entered By: Baltazar Najjarobson, Michael on 07/26/2020 17:12:30 -------------------------------------------------------------------------------- Multi-Disciplinary Care Plan Details Patient Name: Date of Service: [name], [name] 07/26/2020 3:00 PM Medical Record Number: [id number] Patient Account Number: [id number] Date of Birth/Sex: Treating RN: 05/15/1965 (55 y.o. Tommye StandardF) Boehlein, Linda Primary Care Jacki Couse: [place], Betty Other Clinician: Referring Eugean Arnott: Treating Sadaf Przybysz/Extender: Robson, Michael [place], Betty Weeks in Treatment: 2 Active Inactive Wound/Skin Impairment Nursing Diagnoses: Impaired tissue integrity Goals: Patient/caregiver will verbalize understanding of skin care regimen Date Initiated: 07/12/2020 Target Resolution Date: 08/09/2020 Goal Status: Active Ulcer/skin breakdown will have a volume reduction of 30% by week 4 Date Initiated: 07/12/2020 Target Resolution Date: 08/09/2020 Goal Status: Active Interventions: Assess patient/caregiver ability to obtain necessary supplies Assess patient/caregiver ability to perform ulcer/skin care regimen upon admission and as needed Assess  ulceration(s) every visit Provide education on ulcer and skin care Treatment Activities: Skin care regimen initiated : 07/12/2020 Topical wound management initiated : 07/12/2020 Notes: Electronic Signature(s) Signed: 07/26/2020 6:17:32 PM By: Zenaida DeedBoehlein, Linda RN, BSN Entered By: Zenaida DeedBoehlein, Linda on 07/26/2020 17:33:37 -------------------------------------------------------------------------------- Pain Assessment Details Patient Name: Date of Service: [name], [name] 07/26/2020 3:00 PM Medical Record Number: [id number] Patient Account Number: [id number] Date of Birth/Sex: Treating RN: 05/15/1965 (55 y.o. Ardis RowanF) Breedlove, [name] Primary Care Elliott Quade: [place], Betty Other Clinician: Referring Gatsby Chismar: Treating Rande Dario/Extender: Robson, Michael [place], Betty Weeks in Treatment: 2 Active Problems Location of Pain Severity and Description of Pain Patient Has Paino Yes Site Locations Pain Location: Pain Location: Pain in Ulcers With Dressing Change: Yes Duration of the Pain. Constant / Intermittento Constant Rate the pain. Current Pain Level: 7 Worst Pain Level: 10 Least Pain Level: 0 Tolerable Pain Level: 7 Character of Pain Describe the Pain: Aching Pain Management and Medication Current Pain Management: Medication: Yes Cold Application: No Rest: Yes Massage: No Activity: No T.E.N.S.: No Heat Application: No Leg drop or elevation: No Is the Current Pain Management Adequate: Adequate How does your wound  impact your activities of daily livingo Sleep: No Bathing: No Appetite: No Relationship With Others: No Bladder Continence: No Emotions: No Bowel Continence: No Work: No Toileting: No Drive: No Dressing: No Hobbies: No Electronic Signature(s) Signed: 07/26/2020 6:18:32 PM By: Fonnie Mu RN Entered By: Fonnie Mu on 07/26/2020 16:04:45 -------------------------------------------------------------------------------- Patient/Caregiver Education  Details Patient Name: Date of Service: [name], [name] 6/24/2022andnbsp3:00 PM Medical Record Number: [id number] Patient Account Number: [id number] Date of Birth/Gender: Treating RN: [birth year] (55 y.o. Tommye Standard Primary Care Physician: [place], Betty Other Clinician: Referring Physician: Treating Physician/Extender: Robson, Michael [place], Betty Weeks in Treatment: 2 Education Assessment Education Provided To: Patient Education Topics Provided Venous: Methods: Explain/Verbal Responses: Reinforcements needed, State content correctly Wound/Skin Impairment: Methods: Explain/Verbal Responses: Reinforcements needed, State content correctly Electronic Signature(s) Signed: 07/26/2020 6:17:32 PM By: Zenaida Deed RN, BSN Entered By: Zenaida Deed on 07/26/2020 17:33:57 -------------------------------------------------------------------------------- Wound Assessment Details Patient Name: Date of Service: [name], [name] 07/26/2020 3:00 PM Medical Record Number: [id number] Patient Account Number: [id number] Date of Birth/Sex: Treating RN: 1965/07/01 (55 y.o. Ardis Rowan, [name] Primary Care Conlan Miceli: [place], Betty Other Clinician: Referring Devanee Pomplun: Treating Halaina Vanduzer/Extender: Robson, Michael [place], Betty Weeks in Treatment: 2 Wound Status Wound Number: 1 Primary Etiology: Open Surgical Wound Wound Location: Left, Lateral Ankle Wound Status: Open Wounding Event: Surgical Injury Comorbid History: Anemia, Asthma Date Acquired: 02/15/2020 Weeks Of Treatment: 2 Clustered Wound: No Photos Wound Measurements Length: (cm) 3.7 Width: (cm) 1.2 Depth: (cm) 0.3 Area: (cm) 3.487 Volume: (cm) 1.046 % Reduction in Area: 22.1% % Reduction in Volume: 53.3% Epithelialization: None Tunneling: No Undermining: No Wound Description Classification: Full Thickness Without Exposed Support Structures Wound Margin: Distinct, outline attached Exudate Amount:  Medium Exudate Type: Serosanguineous Exudate Color: red, brown Foul Odor After Cleansing: No Slough/Fibrino Yes Wound Bed Granulation Amount: Large (67-100%) Exposed Structure Granulation Quality: Red, Pink Fascia Exposed: No Necrotic Amount: Small (1-33%) Fat Layer (Subcutaneous Tissue) Exposed: Yes Necrotic Quality: Adherent Slough Tendon Exposed: No Muscle Exposed: No Joint Exposed: No Bone Exposed: No Treatment Notes Wound #1 (Ankle) Wound Laterality: Left, Lateral Cleanser Soap and Water Discharge Instruction: May shower and wash wound with dial antibacterial soap and water prior to dressing change. Wound Cleanser Discharge Instruction: Cleanse the wound with wound cleanser prior to applying a clean dressing using gauze sponges, not tissue or cotton balls. Peri-Wound Care Sween Lotion (Moisturizing lotion) Discharge Instruction: Apply moisturizing lotion as directed Topical Primary Dressing IODOFLEX 0.9% Cadexomer Iodine Pad 4x6 cm Discharge Instruction: Apply to wound bed as instructed Secondary Dressing Woven Gauze Sponge, Non-Sterile 4x4 in Discharge Instruction: Apply over primary dressing as directed. ABD Pad, 5x9 Discharge Instruction: Apply over primary dressing as directed. Secured With Compression Wrap ThreePress (3 layer compression wrap) Discharge Instruction: Apply three layer compression as directed. Compression Stockings Add-Ons Electronic Signature(s) Signed: 07/26/2020 5:07:58 PM By: Karl Ito Signed: 07/26/2020 6:18:32 PM By: Fonnie Mu RN Entered By: Karl Ito on 07/26/2020 16:57:38 -------------------------------------------------------------------------------- Vitals Details Patient Name: Date of Service: [name], [name] 07/26/2020 3:00 PM Medical Record Number: [id number] Patient Account Number: [id number] Date of Birth/Sex: Treating RN: December 05, 1965 (55 y.o. Ardis Rowan, [name] Primary Care Ramone Gander: [place],  Betty Other Clinician: Referring Arial Galligan: Treating Jerauld Bostwick/Extender: Robson, Michael [place], Betty Weeks in Treatment: 2 Vital Signs Time Taken: 16:01 Temperature (F): 98.4 Height (in): 67 Pulse (bpm): 94 Weight (lbs): 270 Respiratory Rate (breaths/min): 17 Body Mass Index (BMI): 42.3 Blood Pressure (mmHg): 166/94 Reference Range: 80 - 120 mg / dl Electronic Signature(s) Signed: 07/26/2020 6:18:32 PM By:  Fonnie Mu RN Entered By: Fonnie Mu on 07/26/2020 16:04:09

## 2020-07-26 NOTE — Progress Notes (Signed)
Liberty HandyCORNEY, Jayna ([id number]) Visit Report for 07/26/2020 Debridement Details Patient Name: Date of Service: [name], Verlisa 07/26/2020 3:00 PM Medical Record Number: [id number] Patient Account Number: [id number] Date of Birth/Sex: Treating RN: [birth year] (55 y.o. Billy CoastF) Boehlein, Linda Primary Care Provider: [place], Betty Other Clinician: Referring Provider: Treating Provider/Extender: Valente Fosberg [place], Betty Weeks in Treatment: 2 Debridement Performed for Assessment: Wound #1 Left,Lateral Ankle Performed By: Physician Maxwell Caulobson, Milam Allbaugh G., MD Debridement Type: Debridement Level of Consciousness (Pre-procedure): Awake and Alert Pre-procedure Verification/Time Out Yes - 16:45 Taken: Start Time: 16:47 Pain Control: Other : benzocaine 20% spray T Area Debrided (L x W): otal 3.7 (cm) x 1.2 (cm) = 4.44 (cm) Tissue and other material debrided: Viable, Non-Viable, Slough, Subcutaneous, Slough Level: Skin/Subcutaneous Tissue Debridement Description: Excisional Instrument: Curette Bleeding: Minimum Hemostasis Achieved: Pressure End Time: 16:50 Procedural Pain: 5 Post Procedural Pain: 3 Response to Treatment: Procedure was tolerated well Level of Consciousness (Post- Awake and Alert procedure): Post Debridement Measurements of Total Wound Length: (cm) 3.7 Width: (cm) 1.2 Depth: (cm) 0.3 Volume: (cm) 1.046 Character of Wound/Ulcer Post Debridement: Improved Post Procedure Diagnosis Same as Pre-procedure Electronic Signature(s) Signed: 07/26/2020 5:20:40 PM By: Baltazar Najjarobson, Jassiah Viviano MD Signed: 07/26/2020 6:17:32 PM By: Zenaida DeedBoehlein, Linda RN, BSN Entered By: Baltazar Najjarobson, Vance Belcourt on 07/26/2020 17:12:42 -------------------------------------------------------------------------------- HPI Details Patient Name: Date of Service: [name], Chaquana 07/26/2020 3:00 PM Medical Record Number: [id number] Patient Account Number: [id number] Date of Birth/Sex: Treating RN: [birth year] (55 y.o. Tommye StandardF)  Boehlein, Linda Primary Care Provider: [place], Betty Other Clinician: Referring Provider: Treating Provider/Extender: Zaxton Angerer [place], Betty Weeks in Treatment: 2 History of Present Illness HPI Description: ADMISSION 07/12/2020; This is a otherwise well 55 year old woman who suffered a fall on the ice in January. She developed a left ankle fibular fracture requiring an ORIF on February 15, 2020. She is followed by Dr. Margarita Ranaimothy Murphy and she has been using wet-to-dry dressings for a long period now however the wound is only slowly contracting and she was referred for our review of this. 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 She has some edema in her lower extremities and she uses some form of compression stocking. Past medical history includes hypothyroidism, irritable bowel syndrome, anemia. She may have some degree of chronic venous insufficiency necessitating use of compression stockings. She is not a diabetic ABI in the left leg in our clinic was noncompressible however her peripheral pulses are easily palpable 6/17; Wound is healthier looking but still non viable surface under illumination. using iodoflex 6/24; patient's wound is smaller. Still some debris on the surface. Using Iodoflex. I hope to transition today to silver collagen but I think that may be overly ambitious Electronic Signature(s) Signed: 07/26/2020 5:20:40 PM By: Baltazar Najjarobson, Mikeria Valin MD Entered By: Baltazar Najjarobson, Briar Sword on 07/26/2020 17:13:21 -------------------------------------------------------------------------------- Physical Exam Details Patient Name: Date of Service: [name], Jasslyn 07/26/2020 3:00 PM Medical Record Number: [id number] Patient Account Number: [id number] Date of Birth/Sex: Treating RN: [birth year] (55 y.o. Tommye StandardF) Boehlein, Linda Primary Care Provider: [place], Betty Other Clinician: Referring Provider: Treating Provider/Extender: Basem Yannuzzi [place],  Betty Weeks in Treatment: 2 Constitutional Patient is hypertensive.. Pulse regular and within target range for patient.Marland Kitchen. Respirations regular, non-labored and within target range.. Temperature is normal and within the target range for the patient.Marland Kitchen. Appears in no distress. Notes Wound exam; much better since circumference of the wound. Under illumination still some debris on the surface which I debrided with a #5 curette gently this cleans up  quite nicely in general everything slightly better including the wound surface and the wound dimensions. No evidence of infection Electronic Signature(s) Signed: 07/26/2020 5:20:40 PM By: Baltazar Najjar MD Entered By: Baltazar Najjar on 07/26/2020 17:15:00 -------------------------------------------------------------------------------- Physician Orders Details Patient Name: Date of Service: [name], Lizzete 07/26/2020 3:00 PM Medical Record Number: [id number] Patient Account Number: [id number] Date of Birth/Sex: Treating RN: 1965-07-01 (55 y.o. Ardis Rowan, [name] Primary Care Provider: [place], Betty Other Clinician: Referring Provider: Treating Provider/Extender: Haja Crego [place], Betty Weeks in Treatment: 2 Verbal / Phone Orders: No Diagnosis Coding ICD-10 Coding Code Description T81.31XD Disruption of external operation (surgical) wound, not elsewhere classified, subsequent encounter I87.312 Chronic venous hypertension (idiopathic) with ulcer of left lower extremity L97.828 Non-pressure chronic ulcer of other part of left lower leg with other specified severity Follow-up Appointments ppointment in 1 week. - with Dr. Leanord Hawking Return A Cellular or Tissue Based Products Cellular or Tissue Based Product Type: - 07/19/20: Apligraf: Run insurance benefits Bathing/ Shower/ Hygiene May shower with protection but do not get wound dressing(s) wet. Edema Control - Lymphedema / SCD / Other Elevate legs to the level of the heart or above for 30  minutes daily and/or when sitting, a frequency of: Avoid standing for long periods of time. Exercise regularly Additional Orders / Instructions Follow Nutritious Diet - Increase protein Wound Treatment Wound #1 - Ankle Wound Laterality: Left, Lateral Cleanser: Soap and Water 1 x Per Week/30 Days Discharge Instructions: May shower and wash wound with dial antibacterial soap and water prior to dressing change. Cleanser: Wound Cleanser 1 x Per Week/30 Days Discharge Instructions: Cleanse the wound with wound cleanser prior to applying a clean dressing using gauze sponges, not tissue or cotton balls. Peri-Wound Care: Sween Lotion (Moisturizing lotion) 1 x Per Week/30 Days Discharge Instructions: Apply moisturizing lotion as directed Prim Dressing: IODOFLEX 0.9% Cadexomer Iodine Pad 4x6 cm 1 x Per Week/30 Days ary Discharge Instructions: Apply to wound bed as instructed Secondary Dressing: Woven Gauze Sponge, Non-Sterile 4x4 in 1 x Per Week/30 Days Discharge Instructions: Apply over primary dressing as directed. Secondary Dressing: ABD Pad, 5x9 1 x Per Week/30 Days Discharge Instructions: Apply over primary dressing as directed. Compression Wrap: ThreePress (3 layer compression wrap) 1 x Per Week/30 Days Discharge Instructions: Apply three layer compression as directed. Electronic Signature(s) Signed: 07/26/2020 5:20:40 PM By: Baltazar Najjar MD Signed: 07/26/2020 6:18:32 PM By: Fonnie Mu RN Entered By: Fonnie Mu on 07/26/2020 16:54:35 -------------------------------------------------------------------------------- Problem List Details Patient Name: Date of Service: [name], Takenya 07/26/2020 3:00 PM Medical Record Number: [id number] Patient Account Number: [id number] Date of Birth/Sex: Treating RN: 1965-11-27 (55 y.o. Ardis Rowan, [name] Primary Care Provider: [place], Betty Other Clinician: Referring Provider: Treating Provider/Extender: Vaden Becherer [place],  Betty Weeks in Treatment: 2 Active Problems ICD-10 Encounter Code Description Active Date MDM Diagnosis T81.31XD Disruption of external operation (surgical) wound, not elsewhere classified, 07/12/2020 No Yes subsequent encounter I87.312 Chronic venous hypertension (idiopathic) with ulcer of left lower extremity 07/12/2020 No Yes L97.828 Non-pressure chronic ulcer of other part of left lower leg with other specified 07/12/2020 No Yes severity Inactive Problems Resolved Problems Electronic Signature(s) Signed: 07/26/2020 5:20:40 PM By: Baltazar Najjar MD Entered By: Baltazar Najjar on 07/26/2020 17:12:25 -------------------------------------------------------------------------------- Progress Note Details Patient Name: Date of Service: [name], Jamayia 07/26/2020 3:00 PM Medical Record Number: [id number] Patient Account Number: [id number] Date of Birth/Sex: Treating RN: [birth year] (55 y.o. Tommye Standard Primary Care Provider: [place], Betty Other Clinician: Referring Provider: Treating Provider/Extender: Dalasia Predmore [place],  Harrell Lark in Treatment: 2 Subjective History of Present Illness (HPI) ADMISSION 07/12/2020; This is a otherwise well 55 year old woman who suffered a fall on the ice in January. She developed a left ankle fibular fracture requiring an ORIF on February 15, 2020. She is followed by Dr. Margarita Rana and she has been using wet-to-dry dressings for a long period now however the wound is only slowly contracting and she was referred for our review of this. 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 She has some edema in her lower extremities and she uses some form of compression stocking. Past medical history includes hypothyroidism, irritable bowel syndrome, anemia. She may have some degree of chronic venous insufficiency necessitating use of compression stockings. She is not a diabetic ABI in the left leg in our clinic  was noncompressible however her peripheral pulses are easily palpable 6/17; Wound is healthier looking but still non viable surface under illumination. using iodoflex 6/24; patient's wound is smaller. Still some debris on the surface. Using Iodoflex. I hope to transition today to silver collagen but I think that may be overly ambitious Objective Constitutional Patient is hypertensive.. Pulse regular and within target range for patient.Marland Kitchen Respirations regular, non-labored and within target range.. Temperature is normal and within the target range for the patient.Marland Kitchen Appears in no distress. Vitals Time Taken: 4:01 PM, Height: 67 in, Weight: 270 lbs, BMI: 42.3, Temperature: 98.4 F, Pulse: 94 bpm, Respiratory Rate: 17 breaths/min, Blood Pressure: 166/94 mmHg. General Notes: Wound exam; much better since circumference of the wound. Under illumination still some debris on the surface which I debrided with a #5 curette gently this cleans up quite nicely in general everything slightly better including the wound surface and the wound dimensions. No evidence of infection Integumentary (Hair, Skin) Wound #1 status is Open. Original cause of wound was Surgical Injury. The date acquired was: 02/15/2020. The wound has been in treatment 2 weeks. The wound is located on the Left,Lateral Ankle. The wound measures 3.7cm length x 1.2cm width x 0.3cm depth; 3.487cm^2 area and 1.046cm^3 volume. There is Fat Layer (Subcutaneous Tissue) exposed. There is no tunneling or undermining noted. There is a medium amount of serosanguineous drainage noted. The wound margin is distinct with the outline attached to the wound base. There is large (67-100%) red, pink granulation within the wound bed. There is a small (1- 33%) amount of necrotic tissue within the wound bed including Adherent Slough. Assessment Active Problems ICD-10 Disruption of external operation (surgical) wound, not elsewhere classified, subsequent  encounter Chronic venous hypertension (idiopathic) with ulcer of left lower extremity Non-pressure chronic ulcer of other part of left lower leg with other specified severity Procedures Wound #1 Pre-procedure diagnosis of Wound #1 is an Open Surgical Wound located on the Left,Lateral Ankle . There was a Excisional Skin/Subcutaneous Tissue Debridement with a total area of 4.44 sq cm performed by Maxwell Caul., MD. With the following instrument(s): Curette to remove Viable and Non-Viable tissue/material. Material removed includes Subcutaneous Tissue and Slough and after achieving pain control using Other (benzocaine 20% spray). No specimens were taken. A time out was conducted at 16:45, prior to the start of the procedure. A Minimum amount of bleeding was controlled with Pressure. The procedure was tolerated well with a pain level of 5 throughout and a pain level of 3 following the procedure. Post Debridement Measurements: 3.7cm length x 1.2cm width x 0.3cm depth; 1.046cm^3 volume. Character of Wound/Ulcer Post  Debridement is improved. Post procedure Diagnosis Wound #1: Same as Pre-Procedure Pre-procedure diagnosis of Wound #1 is an Open Surgical Wound located on the Left,Lateral Ankle . There was a Three Layer Compression Therapy Procedure by Shawn Stall, RN. Post procedure Diagnosis Wound #1: Same as Pre-Procedure Plan Follow-up Appointments: Return Appointment in 1 week. - with Dr. Leanord Hawking Cellular or Tissue Based Products: Cellular or Tissue Based Product Type: - 07/19/20: Apligraf: Run insurance benefits Bathing/ Shower/ Hygiene: May shower with protection but do not get wound dressing(s) wet. Edema Control - Lymphedema / SCD / Other: Elevate legs to the level of the heart or above for 30 minutes daily and/or when sitting, a frequency of: Avoid standing for long periods of time. Exercise regularly Additional Orders / Instructions: Follow Nutritious Diet - Increase protein WOUND  #1: - Ankle Wound Laterality: Left, Lateral Cleanser: Soap and Water 1 x Per Week/30 Days Discharge Instructions: May shower and wash wound with dial antibacterial soap and water prior to dressing change. Cleanser: Wound Cleanser 1 x Per Week/30 Days Discharge Instructions: Cleanse the wound with wound cleanser prior to applying a clean dressing using gauze sponges, not tissue or cotton balls. Peri-Wound Care: Sween Lotion (Moisturizing lotion) 1 x Per Week/30 Days Discharge Instructions: Apply moisturizing lotion as directed Prim Dressing: IODOFLEX 0.9% Cadexomer Iodine Pad 4x6 cm 1 x Per Week/30 Days ary Discharge Instructions: Apply to wound bed as instructed Secondary Dressing: Woven Gauze Sponge, Non-Sterile 4x4 in 1 x Per Week/30 Days Discharge Instructions: Apply over primary dressing as directed. Secondary Dressing: ABD Pad, 5x9 1 x Per Week/30 Days Discharge Instructions: Apply over primary dressing as directed. Com pression Wrap: ThreePress (3 layer compression wrap) 1 x Per Week/30 Days Discharge Instructions: Apply three layer compression as directed. 1. I have not changed the dressing still Iodoflex. 2. The patient was approved for Apligraf and I am not ready to use that this week or apply. Careful attention to dimensions and the wound surface next week. Electronic Signature(s) Signed: 07/26/2020 5:20:40 PM By: Baltazar Najjar MD Entered By: Baltazar Najjar on 07/26/2020 17:15:35 -------------------------------------------------------------------------------- SuperBill Details Patient Name: Date of Service: [name], [facility] 07/26/2020 Medical Record Number: [id number] Patient Account Number: [id number] Date of Birth/Sex: Treating RN: Feb 24, 1965 (55 y.o. Ardis Rowan, [name] Primary Care Provider: [place], Betty Other Clinician: Referring Provider: Treating Provider/Extender: Kilo Eshelman [place], Betty Weeks in Treatment: 2 Diagnosis Coding ICD-10 Codes Code  Description T81.31XD Disruption of external operation (surgical) wound, not elsewhere classified, subsequent encounter I87.312 Chronic venous hypertension (idiopathic) with ulcer of left lower extremity L97.828 Non-pressure chronic ulcer of other part of left lower leg with other specified severity Facility Procedures CPT4 Code: [id number] Description: 11042 - DEB SUBQ TISSUE 20 SQ CM/< ICD-10 Diagnosis Description L97.828 Non-pressure chronic ulcer of other part of left lower leg with other specified Modifier: severity Quantity: 1 Physician Procedures : CPT4 Code Description Modifier [id number] 11042 - WC PHYS SUBQ TISS 20 SQ CM ICD-10 Diagnosis Description L97.828 Non-pressure chronic ulcer of other part of left lower leg with other specified severity Quantity: 1 Electronic Signature(s) Signed: 07/26/2020 5:20:40 PM By: Baltazar Najjar MD Entered By: Baltazar Najjar on 07/26/2020 17:15:45

## 2020-07-31 ENCOUNTER — Encounter (HOSPITAL_BASED_OUTPATIENT_CLINIC_OR_DEPARTMENT_OTHER): Payer: 59 | Admitting: Internal Medicine

## 2020-07-31 ENCOUNTER — Other Ambulatory Visit: Payer: Self-pay

## 2020-07-31 DIAGNOSIS — L97828 Non-pressure chronic ulcer of other part of left lower leg with other specified severity: Secondary | ICD-10-CM | POA: Diagnosis not present

## 2020-07-31 NOTE — Progress Notes (Signed)
KEYAIRRA, KOLINSKI ([id number]) Visit Report for 07/31/2020 Arrival Information Details Patient Name: Date of Service: [name], [name] 07/31/2020 10:00 A M Medical Record Number: [id number] Patient Account Number: [id number] Date of Birth/Sex: Treating RN: [birth year] (55 y.o. Dorthula Perfect, Emeterio Reeve Primary Care Derick Seminara: [place], Betty Other Clinician: Referring Tyasia Packard: Treating Kelen Laura/Extender: Robson, Michael [place], Betty Weeks in Treatment: 2 Visit Information History Since Last Visit Added or deleted any medications: No Patient Arrived: Ambulatory Any new allergies or adverse reactions: No Arrival Time: 10:27 Had a fall or experienced change in No Accompanied By: self activities of daily living that may affect Transfer Assistance: None risk of falls: Patient Identification Verified: Yes Signs or symptoms of abuse/neglect since last visito No Secondary Verification Process Completed: Yes Hospitalized since last visit: No Patient Requires Transmission-Based Precautions: No Implantable device outside of the clinic excluding No Patient Has Alerts: No cellular tissue based products placed in the center since last visit: Has Dressing in Place as Prescribed: Yes Pain Present Now: No Electronic Signature(s) Signed: 07/31/2020 10:30:26 AM By: Karl Ito Entered By: Karl Ito on 07/31/2020 10:27:58 -------------------------------------------------------------------------------- Compression Therapy Details Patient Name: Date of Service: [name], [name] 07/31/2020 10:00 A M Medical Record Number: [id number] Patient Account Number: [id number] Date of Birth/Sex: Treating RN: 07-Oct-1965 (55 y.o. [name] Primary Care Khaylee Mcevoy: [place], Betty Other Clinician: Referring Miko Markwood: Treating Gamaliel Charney/Extender: Robson, Michael [place], Betty Weeks in Treatment: 2 Compression Therapy Performed for Wound Assessment: Wound #1 Left,Lateral Ankle Performed By: Clinician  Zandra Abts, RN Compression Type: Three Layer Post Procedure Diagnosis Same as Pre-procedure Electronic Signature(s) Signed: 07/31/2020 4:56:05 PM By: Zandra Abts RN, BSN Entered By: Zandra Abts on 07/31/2020 10:45:30 -------------------------------------------------------------------------------- Encounter Discharge Information Details Patient Name: Date of Service: [name], [name] 07/31/2020 10:00 A M Medical Record Number: [id number] Patient Account Number: [id number] Date of Birth/Sex: Treating RN: December 13, 1965 (55 y.o. [name] Primary Care Galileah Piggee: [place], Betty Other Clinician: Referring Lauria Depoy: Treating Ramandeep Arington/Extender: Robson, Michael [place], Betty Weeks in Treatment: 2 Encounter Discharge Information Items Discharge Condition: Stable Ambulatory Status: Ambulatory Discharge Destination: Home Transportation: Private Auto Accompanied By: self Schedule Follow-up Appointment: Yes Clinical Summary of Care: Electronic Signature(s) Signed: 07/31/2020 6:10:10 PM By: Shawn Stall Entered By: Shawn Stall on 07/31/2020 10:59:21 -------------------------------------------------------------------------------- Lower Extremity Assessment Details Patient Name: Date of Service: [name], [name] 07/31/2020 10:00 A M Medical Record Number: [id number] Patient Account Number: [id number] Date of Birth/Sex: Treating RN: 25-Apr-1965 (55 y.o. [name] Primary Care Xander Jutras: [place], Betty Other Clinician: Referring Durenda Pechacek: Treating Owin Vignola/Extender: Robson, Michael [place], Betty Weeks in Treatment: 2 Edema Assessment Assessed: Kyra Searles: Yes] [Right: No] Edema: [Left: Ye] [Right: s] Calf Left: Right: Point of Measurement: 36 cm From Medial Instep 32 cm Ankle Left: Right: Point of Measurement: 11 cm From Medial Instep 24 cm Vascular Assessment Pulses: Dorsalis Pedis Palpable: [Left:Yes] Electronic Signature(s) Signed: 07/31/2020 6:10:10 PM By:  Shawn Stall Entered By: Shawn Stall on 07/31/2020 10:32:19 -------------------------------------------------------------------------------- Multi Wound Chart Details Patient Name: Date of Service: [name], [name] 07/31/2020 10:00 A M Medical Record Number: [id number] Patient Account Number: [id number] Date of Birth/Sex: Treating RN: [birth year] (55 y.o. [name] Primary Care Klyn Kroening: [place], Betty Other Clinician: Referring Enyah Moman: Treating Michayla Mcneil/Extender: Robson, Michael [place], Betty Weeks in Treatment: 2 Vital Signs Height(in): 67 Pulse(bpm): 84 Weight(lbs): 270 Blood Pressure(mmHg): 148/88 Body Mass Index(BMI): 42 Temperature(F): 97.8 Respiratory Rate(breaths/min): 17 Photos: [1:No Photos Left, Lateral Ankle] [N/A:N/A N/A] Wound Location: [1:Surgical Injury] [N/A:N/A] Wounding Event: [1:Open Surgical Wound] [N/A:N/A] Primary Etiology: [1:Anemia, Asthma] [N/A:N/A]  Comorbid History: [1:02/15/2020] [N/A:N/A] Date Acquired: [1:2] [N/A:N/A] Weeks of Treatment: [1:Open] [N/A:N/A] Wound Status: [1:4.1x1.1x0.2] [N/A:N/A] Measurements L x W x D (cm) [1:3.542] [N/A:N/A] A (cm) : rea [1:0.708] [N/A:N/A] Volume (cm) : [1:20.90%] [N/A:N/A] % Reduction in A rea: [1:68.40%] [N/A:N/A] % Reduction in Volume: [1:Full Thickness Without Exposed] [N/A:N/A] Classification: [1:Support Structures Medium] [N/A:N/A] Exudate Amount: [1:Serosanguineous] [N/A:N/A] Exudate Type: [1:red, brown] [N/A:N/A] Exudate Color: [1:Distinct, outline attached] [N/A:N/A] Wound Margin: [1:Large (67-100%)] [N/A:N/A] Granulation Amount: [1:Red, Pink] [N/A:N/A] Granulation Quality: [1:None Present (0%)] [N/A:N/A] Necrotic Amount: [1:Fat Layer (Subcutaneous Tissue): Yes N/A] Exposed Structures: [1:Fascia: No Tendon: No Muscle: No Joint: No Bone: No Small (1-33%)] [N/A:N/A] Treatment Notes Electronic Signature(s) Signed: 07/31/2020 4:56:05 PM By: Zandra Abts RN, BSN Signed: 07/31/2020  4:58:19 PM By: Baltazar Najjar MD Entered By: Baltazar Najjar on 07/31/2020 10:40:58 -------------------------------------------------------------------------------- Multi-Disciplinary Care Plan Details Patient Name: Date of Service: [name], [name] 07/31/2020 10:00 A M Medical Record Number: [id number] Patient Account Number: [id number] Date of Birth/Sex: Treating RN: 1965-03-31 (55 y.o. [name] Primary Care Louana Fontenot: [place], Betty Other Clinician: Referring Montague Corella: Treating Rosabel Sermeno/Extender: Robson, Michael [place], Betty Weeks in Treatment: 2 Active Inactive Wound/Skin Impairment Nursing Diagnoses: Impaired tissue integrity Goals: Patient/caregiver will verbalize understanding of skin care regimen Date Initiated: 07/12/2020 Target Resolution Date: 08/16/2020 Goal Status: Active Ulcer/skin breakdown will have a volume reduction of 30% by week 4 Date Initiated: 07/12/2020 Target Resolution Date: 08/16/2020 Goal Status: Active Interventions: Assess patient/caregiver ability to obtain necessary supplies Assess patient/caregiver ability to perform ulcer/skin care regimen upon admission and as needed Assess ulceration(s) every visit Provide education on ulcer and skin care Treatment Activities: Skin care regimen initiated : 07/12/2020 Topical wound management initiated : 07/12/2020 Notes: Electronic Signature(s) Signed: 07/31/2020 4:56:05 PM By: Zandra Abts RN, BSN Entered By: Zandra Abts on 07/31/2020 10:25:31 -------------------------------------------------------------------------------- Pain Assessment Details Patient Name: Date of Service: [name], [name] 07/31/2020 10:00 A M Medical Record Number: [id number] Patient Account Number: [id number] Date of Birth/Sex: Treating RN: Jun 24, 1965 (55 y.o. [name] Primary Care Elazar Argabright: [place], Betty Other Clinician: Referring Jacquelina Hewins: Treating Evelise Reine/Extender: Robson, Michael [place],  Betty Weeks in Treatment: 2 Active Problems Location of Pain Severity and Description of Pain Patient Has Paino No Site Locations Pain Management and Medication Current Pain Management: Electronic Signature(s) Signed: 07/31/2020 10:30:26 AM By: Karl Ito Signed: 07/31/2020 4:56:05 PM By: Zandra Abts RN, BSN Entered By: Karl Ito on 07/31/2020 10:28:26 -------------------------------------------------------------------------------- Patient/Caregiver Education Details Patient Name: Date of Service: [name], [name] 6/29/2022andnbsp10:00 A M Medical Record Number: [id number] Patient Account Number: [id number] Date of Birth/Gender: Treating RN: [birth year] (55 y.o. [name] Primary Care Physician: [place], Betty Other Clinician: Referring Physician: Treating Physician/Extender: Robson, Michael [place], Betty Weeks in Treatment: 2 Education Assessment Education Provided To: Patient Education Topics Provided Wound/Skin Impairment: Methods: Explain/Verbal Responses: State content correctly Electronic Signature(s) Signed: 07/31/2020 4:56:05 PM By: Zandra Abts RN, BSN Entered By: Zandra Abts on 07/31/2020 10:26:56 -------------------------------------------------------------------------------- Wound Assessment Details Patient Name: Date of Service: [name], [name] 07/31/2020 10:00 A M Medical Record Number: [id number] Patient Account Number: [id number] Date of Birth/Sex: Treating RN: July 14, 1965 (55 y.o. [name] Primary Care Armina Galloway: [place], Betty Other Clinician: Referring Jinelle Butchko: Treating Arizona Nordquist/Extender: Robson, Michael [place], Betty Weeks in Treatment: 2 Wound Status Wound Number: 1 Primary Etiology: Open Surgical Wound Wound Location: Left, Lateral Ankle Wound Status: Open Wounding Event: Surgical Injury Comorbid History: Anemia, Asthma Date Acquired: 02/15/2020 Weeks Of Treatment: 2 Clustered Wound: No Photos Wound  Measurements Length: (cm) 4.1 Width: (  cm) 1.1 Depth: (cm) 0.2 Area: (cm) 3.542 Volume: (cm) 0.708 % Reduction in Area: 20.9% % Reduction in Volume: 68.4% Epithelialization: Small (1-33%) Tunneling: No Undermining: No Wound Description Classification: Full Thickness Without Exposed Support Structures Wound Margin: Distinct, outline attached Exudate Amount: Medium Exudate Type: Serosanguineous Exudate Color: red, brown Foul Odor After Cleansing: No Slough/Fibrino No Wound Bed Granulation Amount: Large (67-100%) Exposed Structure Granulation Quality: Red, Pink Fascia Exposed: No Necrotic Amount: None Present (0%) Fat Layer (Subcutaneous Tissue) Exposed: Yes Tendon Exposed: No Muscle Exposed: No Joint Exposed: No Bone Exposed: No Treatment Notes Wound #1 (Ankle) Wound Laterality: Left, Lateral Cleanser Soap and Water Discharge Instruction: May shower and wash wound with dial antibacterial soap and water prior to dressing change. Wound Cleanser Discharge Instruction: Cleanse the wound with wound cleanser prior to applying a clean dressing using gauze sponges, not tissue or cotton balls. Peri-Wound Care Sween Lotion (Moisturizing lotion) Discharge Instruction: Apply moisturizing lotion as directed Topical Primary Dressing IODOFLEX 0.9% Cadexomer Iodine Pad 4x6 cm Discharge Instruction: Apply to wound bed as instructed Secondary Dressing Woven Gauze Sponge, Non-Sterile 4x4 in Discharge Instruction: Apply over primary dressing as directed. ABD Pad, 5x9 Discharge Instruction: Apply over primary dressing as directed. Secured With Compression Wrap ThreePress (3 layer compression wrap) Discharge Instruction: Apply three layer compression as directed. Compression Stockings Add-Ons Electronic Signature(s) Signed: 07/31/2020 4:56:05 PM By: Zandra Abts RN, BSN Signed: 07/31/2020 5:02:46 PM By: Karl Ito Previous Signature: 07/31/2020 10:30:26 AM Version By: Karl Ito Entered By: Karl Ito on 07/31/2020 16:31:32 -------------------------------------------------------------------------------- Vitals Details Patient Name: Date of Service: [name], [name] 07/31/2020 10:00 A M Medical Record Number: [id number] Patient Account Number: [id number] Date of Birth/Sex: Treating RN: 07/27/1965 (55 y.o. [name] Primary Care Myrlene Riera: [place], Betty Other Clinician: Referring Celisa Schoenberg: Treating Kailan Laws/Extender: Robson, Michael [place], Betty Weeks in Treatment: 2 Vital Signs Time Taken: 10:28 Temperature (F): 97.8 Height (in): 67 Pulse (bpm): 84 Weight (lbs): 270 Respiratory Rate (breaths/min): 17 Body Mass Index (BMI): 42.3 Blood Pressure (mmHg): 148/88 Reference Range: 80 - 120 mg / dl Electronic Signature(s) Signed: 07/31/2020 10:30:26 AM By: Karl Ito Entered By: Karl Ito on 07/31/2020 10:28:19

## 2020-07-31 NOTE — Progress Notes (Signed)
KATRECE, ROEDIGER ([id number]) Visit Report for 07/31/2020 HPI Details Patient Name: Date of Service: [name], [name] 07/31/2020 10:00 A M Medical Record Number: [id number] Patient Account Number: [id number] Date of Birth/Sex: Treating RN: November 19, 1965 (55 y.o. [name] Primary Care Provider: [place], Betty Other Clinician: Referring Provider: Treating Provider/Extender: Obediah Welles [place], Betty Weeks in Treatment: 2 History of Present Illness HPI Description: ADMISSION 07/12/2020; This is a otherwise well 55 year old woman who suffered a fall on the ice in January. She developed a left ankle fibular fracture requiring an ORIF on February 15, 2020. She is followed by Dr. Margarita Rana and she has been using wet-to-dry dressings for a long period now however the wound is only slowly contracting and she was referred for our review of this. 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 She has some edema in her lower extremities and she uses some form of compression stocking. Past medical history includes hypothyroidism, irritable bowel syndrome, anemia. She may have some degree of chronic venous insufficiency necessitating use of compression stockings. She is not a diabetic ABI in the left leg in our clinic was noncompressible however her peripheral pulses are easily palpable 6/17; Wound is healthier looking but still non viable surface under illumination. using iodoflex 6/24; patient's wound is smaller. Still some debris on the surface. Using Iodoflex. I hope to transition today to silver collagen but I think that may be overly ambitious 6/29; wound is about the same size as last visit however the surface of the wound looks a lot better. We have been using Iodoflex. Actually things look is good as I was hoping for today and I think she might be ready to actually go ahead with Apligraf. We talked about this. She follows up with her surgeon  tomorrow Electronic Signature(s) Signed: 07/31/2020 4:58:19 PM By: Baltazar Najjar MD Entered By: Baltazar Najjar on 07/31/2020 10:41:37 -------------------------------------------------------------------------------- Physical Exam Details Patient Name: Date of Service: [name], [name] 07/31/2020 10:00 A M Medical Record Number: [id number] Patient Account Number: [id number] Date of Birth/Sex: Treating RN: 20-Apr-1965 (55 y.o. [name] Primary Care Provider: [place], Betty Other Clinician: Referring Provider: Treating Provider/Extender: Amiera Herzberg [place], Betty Weeks in Treatment: 2 Cardiovascular Good edema control. Notes Wound exam; surface the wound is about the same size as last visit however under illumination things look healthy. There is absolutely no need for debridement no evidence of surrounding infection. Electronic Signature(s) Signed: 07/31/2020 4:58:19 PM By: Baltazar Najjar MD Entered By: Baltazar Najjar on 07/31/2020 10:44:01 -------------------------------------------------------------------------------- Physician Orders Details Patient Name: Date of Service: [name], [name] 07/31/2020 10:00 A M Medical Record Number: [id number] Patient Account Number: [id number] Date of Birth/Sex: Treating RN: May 15, 1965 (55 y.o. [name] Primary Care Provider: [place], Betty Other Clinician: Referring Provider: Treating Provider/Extender: Kemora Pinard [place], Betty Weeks in Treatment: 2 Verbal / Phone Orders: No Diagnosis Coding ICD-10 Coding Code Description T81.31XD Disruption of external operation (surgical) wound, not elsewhere classified, subsequent encounter I87.312 Chronic venous hypertension (idiopathic) with ulcer of left lower extremity L97.828 Non-pressure chronic ulcer of other part of left lower leg with other specified severity Follow-up Appointments ppointment in 1 week. - with Dr. Leanord Hawking Return A Bathing/ Shower/ Hygiene May  shower with protection but do not get wound dressing(s) wet. Edema Control - Lymphedema / SCD / Other Elevate legs to the level of the heart or above for 30 minutes daily and/or when sitting, a frequency of: - throughout the day  Avoid standing for long periods of time. Exercise regularly Additional Orders / Instructions Follow Nutritious Diet - Increase protein Wound Treatment Wound #1 - Ankle Wound Laterality: Left, Lateral Cleanser: Soap and Water 1 x Per Week/30 Days Discharge Instructions: May shower and wash wound with dial antibacterial soap and water prior to dressing change. Cleanser: Wound Cleanser 1 x Per Week/30 Days Discharge Instructions: Cleanse the wound with wound cleanser prior to applying a clean dressing using gauze sponges, not tissue or cotton balls. Peri-Wound Care: Sween Lotion (Moisturizing lotion) 1 x Per Week/30 Days Discharge Instructions: Apply moisturizing lotion as directed Prim Dressing: IODOFLEX 0.9% Cadexomer Iodine Pad 4x6 cm 1 x Per Week/30 Days ary Discharge Instructions: Apply to wound bed as instructed Secondary Dressing: Woven Gauze Sponge, Non-Sterile 4x4 in 1 x Per Week/30 Days Discharge Instructions: Apply over primary dressing as directed. Secondary Dressing: ABD Pad, 5x9 1 x Per Week/30 Days Discharge Instructions: Apply over primary dressing as directed. Compression Wrap: ThreePress (3 layer compression wrap) 1 x Per Week/30 Days Discharge Instructions: Apply three layer compression as directed. Electronic Signature(s) Signed: 07/31/2020 4:56:05 PM By: Zandra Abts RN, BSN Signed: 07/31/2020 4:58:19 PM By: Baltazar Najjar MD Entered By: Zandra Abts on 07/31/2020 10:41:10 -------------------------------------------------------------------------------- Problem List Details Patient Name: Date of Service: [name], [name] 07/31/2020 10:00 A M Medical Record Number: [id number] Patient Account Number: [id number] Date of Birth/Sex: Treating  RN: 11/13/1965 (55 y.o. [name] Primary Care Provider: [place], Betty Other Clinician: Referring Provider: Treating Provider/Extender: Hosea Hanawalt [place], Betty Weeks in Treatment: 2 Active Problems ICD-10 Encounter Code Description Active Date MDM Diagnosis T81.31XD Disruption of external operation (surgical) wound, not elsewhere classified, 07/12/2020 No Yes subsequent encounter I87.312 Chronic venous hypertension (idiopathic) with ulcer of left lower extremity 07/12/2020 No Yes L97.828 Non-pressure chronic ulcer of other part of left lower leg with other specified 07/12/2020 No Yes severity Inactive Problems Resolved Problems Electronic Signature(s) Signed: 07/31/2020 4:58:19 PM By: Baltazar Najjar MD Entered By: Baltazar Najjar on 07/31/2020 10:40:51 -------------------------------------------------------------------------------- Progress Note Details Patient Name: Date of Service: [name], [name] 07/31/2020 10:00 A M Medical Record Number: [id number] Patient Account Number: [id number] Date of Birth/Sex: Treating RN: 1965/10/31 (55 y.o. [name] Primary Care Provider: [place], Betty Other Clinician: Referring Provider: Treating Provider/Extender: Temica Righetti [place], Betty Weeks in Treatment: 2 Subjective History of Present Illness (HPI) ADMISSION 07/12/2020; This is a otherwise well 55 year old woman who suffered a fall on the ice in January. She developed a left ankle fibular fracture requiring an ORIF on February 15, 2020. She is followed by Dr. Margarita Rana and she has been using wet-to-dry dressings for a long period now however the wound is only slowly contracting and she was referred for our review of this. 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 She has some edema in her lower extremities and she uses some form of compression stocking. Past medical history includes hypothyroidism, irritable bowel  syndrome, anemia. She may have some degree of chronic venous insufficiency necessitating use of compression stockings. She is not a diabetic ABI in the left leg in our clinic was noncompressible however her peripheral pulses are easily palpable 6/17; Wound is healthier looking but still non viable surface under illumination. using iodoflex 6/24; patient's wound is smaller. Still some debris on the surface. Using Iodoflex. I hope to transition today to silver collagen but I think that may be overly ambitious 6/29; wound is about the same size  as last visit however the surface of the wound looks a lot better. We have been using Iodoflex. Actually things look is good as I was hoping for today and I think she might be ready to actually go ahead with Apligraf. We talked about this. She follows up with her surgeon tomorrow Objective Constitutional Vitals Time Taken: 10:28 AM, Height: 67 in, Weight: 270 lbs, BMI: 42.3, Temperature: 97.8 F, Pulse: 84 bpm, Respiratory Rate: 17 breaths/min, Blood Pressure: 148/88 mmHg. Cardiovascular Good edema control. General Notes: Wound exam; surface the wound is about the same size as last visit however under illumination things look healthy. There is absolutely no need for debridement no evidence of surrounding infection. Integumentary (Hair, Skin) Wound #1 status is Open. Original cause of wound was Surgical Injury. The date acquired was: 02/15/2020. The wound has been in treatment 2 weeks. The wound is located on the Left,Lateral Ankle. The wound measures 4.1cm length x 1.1cm width x 0.2cm depth; 3.542cm^2 area and 0.708cm^3 volume. There is Fat Layer (Subcutaneous Tissue) exposed. There is no tunneling or undermining noted. There is a medium amount of serosanguineous drainage noted. The wound margin is distinct with the outline attached to the wound base. There is large (67-100%) red, pink granulation within the wound bed. There is no necrotic tissue within the  wound bed. Assessment Active Problems ICD-10 Disruption of external operation (surgical) wound, not elsewhere classified, subsequent encounter Chronic venous hypertension (idiopathic) with ulcer of left lower extremity Non-pressure chronic ulcer of other part of left lower leg with other specified severity Procedures Wound #1 Pre-procedure diagnosis of Wound #1 is an Open Surgical Wound located on the Left,Lateral Ankle . There was a Three Layer Compression Therapy Procedure by Zandra Abts, RN. Post procedure Diagnosis Wound #1: Same as Pre-Procedure Plan Follow-up Appointments: Return Appointment in 1 week. - with Dr. Chauncey Mann Shower/ Hygiene: May shower with protection but do not get wound dressing(s) wet. Edema Control - Lymphedema / SCD / Other: Elevate legs to the level of the heart or above for 30 minutes daily and/or when sitting, a frequency of: - throughout the day Avoid standing for long periods of time. Exercise regularly Additional Orders / Instructions: Follow Nutritious Diet - Increase protein WOUND #1: - Ankle Wound Laterality: Left, Lateral Cleanser: Soap and Water 1 x Per Week/30 Days Discharge Instructions: May shower and wash wound with dial antibacterial soap and water prior to dressing change. Cleanser: Wound Cleanser 1 x Per Week/30 Days Discharge Instructions: Cleanse the wound with wound cleanser prior to applying a clean dressing using gauze sponges, not tissue or cotton balls. Peri-Wound Care: Sween Lotion (Moisturizing lotion) 1 x Per Week/30 Days Discharge Instructions: Apply moisturizing lotion as directed Prim Dressing: IODOFLEX 0.9% Cadexomer Iodine Pad 4x6 cm 1 x Per Week/30 Days ary Discharge Instructions: Apply to wound bed as instructed Secondary Dressing: Woven Gauze Sponge, Non-Sterile 4x4 in 1 x Per Week/30 Days Discharge Instructions: Apply over primary dressing as directed. Secondary Dressing: ABD Pad, 5x9 1 x Per Week/30  Days Discharge Instructions: Apply over primary dressing as directed. Com pression Wrap: ThreePress (3 layer compression wrap) 1 x Per Week/30 Days Discharge Instructions: Apply three layer compression as directed. 1. I am continuing with the Iodoflex however I would like to go ahead and order Apligraf. She will have a Lorraine-pay we discussed this with her. In general it takes at least 2 applications to know whether this is moving things forward perhaps 3 nevertheless this is the quickest way  I know to get something like this to close Electronic Signature(s) Signed: 07/31/2020 4:56:05 PM By: Zandra AbtsLynch, Shatara RN, BSN Signed: 07/31/2020 4:58:19 PM By: Baltazar Najjarobson, Orien Mayhall MD Entered By: Zandra AbtsLynch, Shatara on 07/31/2020 10:46:09 -------------------------------------------------------------------------------- SuperBill Details Patient Name: Date of Service: [name], [name] 07/31/2020 Medical Record Number: [id number] Patient Account Number: [id number] Date of Birth/Sex: Treating RN: December 10, 1965 (55 y.o. Wynelle LinkF) Lynch, Shatara Primary Care Provider: [place], Betty Other Clinician: Referring Provider: Treating Provider/Extender: Zykeem Bauserman [place], Betty Weeks in Treatment: 2 Diagnosis Coding ICD-10 Codes Code Description T81.31XD Disruption of external operation (surgical) wound, not elsewhere classified, subsequent encounter I87.312 Chronic venous hypertension (idiopathic) with ulcer of left lower extremity L97.828 Non-pressure chronic ulcer of other part of left lower leg with other specified severity Facility Procedures CPT4 Code: [id number] Description: (Facility Use Only) [id number] - APPLY MULTLAY COMPRS LWR LT LEG Modifier: Quantity: 1 Physician Procedures : CPT4 Code Description Modifier [id number] - WC PHYS LEVEL 3 - EST PT ICD-10 Diagnosis Description T81.31XD Disruption of external operation (surgical) wound, not elsewhere classified, subsequent encounter I87.312 Chronic venous hypertension   (idiopathic) with ulcer of left lower extremity L97.828 Non-pressure chronic ulcer of other part of left lower leg with other specified severity Quantity: 1 Electronic Signature(s) Signed: 07/31/2020 4:56:05 PM By: Zandra AbtsLynch, Shatara RN, BSN Signed: 07/31/2020 4:58:19 PM By: Baltazar Najjarobson, Calynn Ferrero MD Entered By: Zandra AbtsLynch, Shatara on 07/31/2020 10:45:49

## 2020-08-07 ENCOUNTER — Other Ambulatory Visit: Payer: Self-pay

## 2020-08-07 ENCOUNTER — Encounter (HOSPITAL_BASED_OUTPATIENT_CLINIC_OR_DEPARTMENT_OTHER): Payer: 59 | Attending: Internal Medicine | Admitting: Internal Medicine

## 2020-08-07 DIAGNOSIS — L97828 Non-pressure chronic ulcer of other part of left lower leg with other specified severity: Secondary | ICD-10-CM | POA: Insufficient documentation

## 2020-08-07 NOTE — Progress Notes (Signed)
ISIS, COSTANZA ([id number]) Visit Report for 08/07/2020 Cellular or Tissue Based Product Details Patient Name: Date of Service: [name], [name] 08/07/2020 3:30 PM Medical Record Number: [id number] Patient Account Number: [id number] Date of Birth/Sex: Treating RN: April 17, 1965 (55 y.o. [name] Primary Care Provider: [place], Betty Other Clinician: Referring Provider: Treating Provider/Extender: Nateisha Moyd [place], Betty Weeks in Treatment: 3 Cellular or Tissue Based Product Type Wound #1 Left,Lateral Ankle Applied to: Performed By: Physician Maxwell Caul., MD Cellular or Tissue Based Product Type: Apligraf Level of Consciousness (Pre-procedure): Awake and Alert Pre-procedure Verification/Time Out Yes - 16:23 Taken: Location: trunk / arms / legs Wound Size (sq cm): 3.6 Product Size (sq cm): 44 Waste Size (sq cm): 22 Waste Reason: wound size Amount of Product Applied (sq cm): 22 Instrument Used: Forceps Lot #: [id number] Order #: 1 Expiration Date: 08/13/2020 Fenestrated: Yes Instrument: Blade Reconstituted: Yes Solution Type: normal saline Solution Amount: 6 ml Lot #: [id number] Solution Expiration Date: 10/03/2021 Secured: Yes Secured With: Steri-Strips Dressing Applied: Yes Primary Dressing: gauze, ABD, compression wrap Procedural Pain: 0 Post Procedural Pain: 0 Response to Treatment: Procedure was tolerated well Level of Consciousness (Post- Awake and Alert procedure): Post Procedure Diagnosis Same as Pre-procedure Electronic Signature(s) Signed: 08/07/2020 5:13:41 PM By: Baltazar Najjar MD Entered By: Baltazar Najjar on 08/07/2020 16:30:49 -------------------------------------------------------------------------------- HPI Details Patient Name: Date of Service: [name], [name] 08/07/2020 3:30 PM Medical Record Number: [id number] Patient Account Number: [id number] Date of Birth/Sex: Treating RN: 02-Apr-1965 (55 y.o. [name] Primary  Care Provider: [place], Betty Other Clinician: Referring Provider: Treating Provider/Extender: Dejia Ebron [place], Betty Weeks in Treatment: 3 History of Present Illness HPI Description: ADMISSION 07/12/2020; This is a otherwise well 55 year old woman who suffered a fall on the ice in January. She developed a left ankle fibular fracture requiring an ORIF on February 15, 2020. She is followed by Dr. Margarita Rana and she has been using wet-to-dry dressings for a long period now however the wound is only slowly contracting and she was referred for our review of this. 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 She has some edema in her lower extremities and she uses some form of compression stocking. Past medical history includes hypothyroidism, irritable bowel syndrome, anemia. She may have some degree of chronic venous insufficiency necessitating use of compression stockings. She is not a diabetic ABI in the left leg in our clinic was noncompressible however her peripheral pulses are easily palpable 6/17; Wound is healthier looking but still non viable surface under illumination. using iodoflex 6/24; patient's wound is smaller. Still some debris on the surface. Using Iodoflex. I hope to transition today to silver collagen but I think that may be overly ambitious 6/29; wound is about the same size as last visit however the surface of the wound looks a lot better. We have been using Iodoflex. Actually things look is good as I was hoping for today and I think she might be ready to actually go ahead with Apligraf. We talked about this. She follows up with her surgeon tomorrow 7/6; wound is about the same size we have been using Iodoflex. We applied Apligraf #1 today. I think this is her best chance to get this to close Electronic Signature(s) Signed: 08/07/2020 5:13:41 PM By: Baltazar Najjar MD Entered By: Baltazar Najjar on 08/07/2020  16:31:21 -------------------------------------------------------------------------------- Physical Exam Details Patient Name: Date of Service: [name], [name] 08/07/2020 3:30 PM Medical Record Number: [id number] Patient  Account Number: [id number] Date of Birth/Sex: Treating RN: Mar 11, 1965 (55 y.o. [name] Primary Care Provider: [place], Betty Other Clinician: Referring Provider: Treating Provider/Extender: Zonya Gudger [place], Betty Weeks in Treatment: 3 Constitutional Sitting or standing Blood Pressure is within target range for patient.. Pulse regular and within target range for patient.Marland Kitchen Respirations regular, non-labored and within target range.. Temperature is normal and within the target range for the patient.Marland Kitchen Appears in no distress. Notes Wound exam; surface of the wound about the same sinus things look healthy however. No debridement is required we applied Apligraf #1 in the standard fashion Electronic Signature(s) Signed: 08/07/2020 5:13:41 PM By: Baltazar Najjar MD Entered By: Baltazar Najjar on 08/07/2020 16:31:57 -------------------------------------------------------------------------------- Physician Orders Details Patient Name: Date of Service: [name], [name] 08/07/2020 3:30 PM Medical Record Number: [id number] Patient Account Number: [id number] Date of Birth/Sex: Treating RN: September 15, 1965 (55 y.o. [name] Primary Care Provider: [place], Betty Other Clinician: Referring Provider: Treating Provider/Extender: Eimi Viney [place], Betty Weeks in Treatment: 3 Verbal / Phone Orders: No Diagnosis Coding ICD-10 Coding Code Description T81.31XD Disruption of external operation (surgical) wound, not elsewhere classified, subsequent encounter I87.312 Chronic venous hypertension (idiopathic) with ulcer of left lower extremity L97.828 Non-pressure chronic ulcer of other part of left lower leg with other specified severity Follow-up  Appointments ppointment in 2 weeks. - with Dr. Leanord Hawking Return A Nurse Visit: - 1 week for rewrap Cellular or Tissue Based Products Wound #1 Left,Lateral Ankle Cellular or Tissue Based Product Type: - Apligraf #1 Cellular or Tissue Based Product applied to wound bed, secured with steri-strips, cover with Adaptic or Mepitel. (DO NOT REMOVE). Bathing/ Shower/ Hygiene May shower with protection but do not get wound dressing(s) wet. Edema Control - Lymphedema / SCD / Other Elevate legs to the level of the heart or above for 30 minutes daily and/or when sitting, a frequency of: - throughout the day Avoid standing for long periods of time. Exercise regularly Additional Orders / Instructions Follow Nutritious Diet - Increase protein Wound Treatment Wound #1 - Ankle Wound Laterality: Left, Lateral Cleanser: Soap and Water 1 x Per Week/30 Days Discharge Instructions: May shower and wash wound with dial antibacterial soap and water prior to dressing change. Cleanser: Wound Cleanser 1 x Per Week/30 Days Discharge Instructions: Cleanse the wound with wound cleanser prior to applying a clean dressing using gauze sponges, not tissue or cotton balls. Peri-Wound Care: Sween Lotion (Moisturizing lotion) 1 x Per Week/30 Days Discharge Instructions: Apply moisturizing lotion as directed Prim Dressing: Apligraf ary 1 x Per Week/30 Days Secondary Dressing: Woven Gauze Sponge, Non-Sterile 4x4 in 1 x Per Week/30 Days Discharge Instructions: Apply over primary dressing as directed. Secondary Dressing: ABD Pad, 5x9 1 x Per Week/30 Days Discharge Instructions: Apply over primary dressing as directed. Compression Wrap: ThreePress (3 layer compression wrap) 1 x Per Week/30 Days Discharge Instructions: Apply three layer compression as directed. Electronic Signature(s) Signed: 08/07/2020 5:13:41 PM By: Baltazar Najjar MD Signed: 08/07/2020 7:11:13 PM By: Zandra Abts RN, BSN Entered By: Zandra Abts on 08/07/2020  16:27:12 -------------------------------------------------------------------------------- Problem List Details Patient Name: Date of Service: [name], Sheree 08/07/2020 3:30 PM Medical Record Number: [id number] Patient Account Number: [id number] Date of Birth/Sex: Treating RN: 1965/07/09 (55 y.o. [name] Primary Care Provider: [place], Betty Other Clinician: Referring Provider: Treating Provider/Extender: Doratha Mcswain [place], Betty Weeks in Treatment: 3 Active Problems ICD-10 Encounter Code Description Active Date MDM Diagnosis T81.31XD Disruption of external operation (surgical) wound, not elsewhere classified, 07/12/2020 No Yes subsequent  encounter I87.312 Chronic venous hypertension (idiopathic) with ulcer of left lower extremity 07/12/2020 No Yes L97.828 Non-pressure chronic ulcer of other part of left lower leg with other specified 07/12/2020 No Yes severity Inactive Problems Resolved Problems Electronic Signature(s) Signed: 08/07/2020 5:13:41 PM By: Baltazar Najjar MD Entered By: Baltazar Najjar on 08/07/2020 16:30:30 -------------------------------------------------------------------------------- Progress Note Details Patient Name: Date of Service: [name], Karaline 08/07/2020 3:30 PM Medical Record Number: [id number] Patient Account Number: [id number] Date of Birth/Sex: Treating RN: Jun 25, 1965 (55 y.o. [name] Primary Care Provider: [place], Betty Other Clinician: Referring Provider: Treating Provider/Extender: Sevag Shearn [place], Betty Weeks in Treatment: 3 Subjective History of Present Illness (HPI) ADMISSION 07/12/2020; This is a otherwise well 55 year old woman who suffered a fall on the ice in January. She developed a left ankle fibular fracture requiring an ORIF on February 15, 2020. She is followed by Dr. Margarita Rana and she has been using wet-to-dry dressings for a long period now however the wound is only slowly contracting and  she was referred for our review of this. 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 She has some edema in her lower extremities and she uses some form of compression stocking. Past medical history includes hypothyroidism, irritable bowel syndrome, anemia. She may have some degree of chronic venous insufficiency necessitating use of compression stockings. She is not a diabetic ABI in the left leg in our clinic was noncompressible however her peripheral pulses are easily palpable 6/17; Wound is healthier looking but still non viable surface under illumination. using iodoflex 6/24; patient's wound is smaller. Still some debris on the surface. Using Iodoflex. I hope to transition today to silver collagen but I think that may be overly ambitious 6/29; wound is about the same size as last visit however the surface of the wound looks a lot better. We have been using Iodoflex. Actually things look is good as I was hoping for today and I think she might be ready to actually go ahead with Apligraf. We talked about this. She follows up with her surgeon tomorrow 7/6; wound is about the same size we have been using Iodoflex. We applied Apligraf #1 today. I think this is her best chance to get this to close Objective Constitutional Sitting or standing Blood Pressure is within target range for patient.. Pulse regular and within target range for patient.Marland Kitchen Respirations regular, non-labored and within target range.. Temperature is normal and within the target range for the patient.Marland Kitchen Appears in no distress. Vitals Time Taken: 3:55 PM, Height: 67 in, Weight: 270 lbs, BMI: 42.3, Temperature: 99.0 F, Pulse: 120 bpm, Respiratory Rate: 17 breaths/min, Blood Pressure: 133/84 mmHg. General Notes: Wound exam; surface of the wound about the same sinus things look healthy however. No debridement is required we applied Apligraf #1 in the standard fashion Integumentary (Hair,  Skin) Wound #1 status is Open. Original cause of wound was Surgical Injury. The date acquired was: 02/15/2020. The wound has been in treatment 3 weeks. The wound is located on the Left,Lateral Ankle. The wound measures 3.6cm length x 1cm width x 0.2cm depth; 2.827cm^2 area and 0.565cm^3 volume. There is Fat Layer (Subcutaneous Tissue) exposed. There is no tunneling or undermining noted. There is a medium amount of serosanguineous drainage noted. The wound margin is distinct with the outline attached to the wound base. There is large (67-100%) red, pink granulation within the wound bed. There is no necrotic tissue within the wound bed. Assessment Active  Problems ICD-10 Disruption of external operation (surgical) wound, not elsewhere classified, subsequent encounter Chronic venous hypertension (idiopathic) with ulcer of left lower extremity Non-pressure chronic ulcer of other part of left lower leg with other specified severity Procedures Wound #1 Pre-procedure diagnosis of Wound #1 is an Open Surgical Wound located on the Left,Lateral Ankle. A skin graft procedure using a bioengineered skin substitute/cellular or tissue based product was performed by Maxwell Caul., MD with the following instrument(s): Forceps. Apligraf was applied and secured with Steri-Strips. 22 sq cm of product was utilized and 22 sq cm was wasted due to wound size. Post Application, gauze, ABD, compression wrap was applied. A Time Out was conducted at 16:23, prior to the start of the procedure. The procedure was tolerated well with a pain level of 0 throughout and a pain level of 0 following the procedure. Post procedure Diagnosis Wound #1: Same as Pre-Procedure . Pre-procedure diagnosis of Wound #1 is an Open Surgical Wound located on the Left,Lateral Ankle . There was a Three Layer Compression Therapy Procedure by Zandra Abts, RN. Post procedure Diagnosis Wound #1: Same as Pre-Procedure Plan Follow-up  Appointments: Return Appointment in 2 weeks. - with Dr. Leanord Hawking Nurse Visit: - 1 week for rewrap Cellular or Tissue Based Products: Wound #1 Left,Lateral Ankle: Cellular or Tissue Based Product Type: - Apligraf #1 Cellular or Tissue Based Product applied to wound bed, secured with steri-strips, cover with Adaptic or Mepitel. (DO NOT REMOVE). Bathing/ Shower/ Hygiene: May shower with protection but do not get wound dressing(s) wet. Edema Control - Lymphedema / SCD / Other: Elevate legs to the level of the heart or above for 30 minutes daily and/or when sitting, a frequency of: - throughout the day Avoid standing for long periods of time. Exercise regularly Additional Orders / Instructions: Follow Nutritious Diet - Increase protein WOUND #1: - Ankle Wound Laterality: Left, Lateral Cleanser: Soap and Water 1 x Per Week/30 Days Discharge Instructions: May shower and wash wound with dial antibacterial soap and water prior to dressing change. Cleanser: Wound Cleanser 1 x Per Week/30 Days Discharge Instructions: Cleanse the wound with wound cleanser prior to applying a clean dressing using gauze sponges, not tissue or cotton balls. Peri-Wound Care: Sween Lotion (Moisturizing lotion) 1 x Per Week/30 Days Discharge Instructions: Apply moisturizing lotion as directed Prim Dressing: Apligraf 1 x Per Week/30 Days ary Secondary Dressing: Woven Gauze Sponge, Non-Sterile 4x4 in 1 x Per Week/30 Days Discharge Instructions: Apply over primary dressing as directed. Secondary Dressing: ABD Pad, 5x9 1 x Per Week/30 Days Discharge Instructions: Apply over primary dressing as directed. Com pression Wrap: ThreePress (3 layer compression wrap) 1 x Per Week/30 Days Discharge Instructions: Apply three layer compression as directed. 1. Apligraf #1 in the standard fashion back for a nurse visit next week Electronic Signature(s) Signed: 08/07/2020 5:13:41 PM By: Baltazar Najjar MD Entered By: Baltazar Najjar on  08/07/2020 16:32:19 -------------------------------------------------------------------------------- SuperBill Details Patient Name: Date of Service: [name], Kylii 08/07/2020 Medical Record Number: [id number] Patient Account Number: [id number] Date of Birth/Sex: Treating RN: May 23, 1965 (55 y.o. [name] Primary Care Provider: [place], Betty Other Clinician: Referring Provider: Treating Provider/Extender: Maeva Dant [place], Betty Weeks in Treatment: 3 Diagnosis Coding ICD-10 Codes Code Description T81.31XD Disruption of external operation (surgical) wound, not elsewhere classified, subsequent encounter I87.312 Chronic venous hypertension (idiopathic) with ulcer of left lower extremity L97.828 Non-pressure chronic ulcer of other part of left lower leg with other specified severity Facility Procedures CPT4 Code: 94503888 Description: (Facility Use  Only) Apligraf 1 SQ CM Modifier: Quantity: 44 CPT4 Code: [id number] Description: 15271 - SKIN SUB GRAFT TRNK/ARM/LEG ICD-10 Diagnosis Description L97.828 Non-pressure chronic ulcer of other part of left lower leg with other specified s Modifier: everity Quantity: 1 Physician Procedures : CPT4 Code Description Modifier [id number] 15271 - WC PHYS SKIN SUB GRAFT TRNK/ARM/LEG ICD-10 Diagnosis Description L97.828 Non-pressure chronic ulcer of other part of left lower leg with other specified severity Quantity: 1 Electronic Signature(s) Signed: 08/07/2020 5:13:41 PM By: Baltazar Najjarobson, Courtenay Creger MD Entered By: Baltazar Najjarobson, Caspar Favila on 08/07/2020 16:32:35

## 2020-08-07 NOTE — Progress Notes (Signed)
ZAMIA, TYMINSKI ([id number]) Visit Report for 08/07/2020 Arrival Information Details Patient Name: Date of Service: [name], [name] 08/07/2020 3:30 PM Medical Record Number: [id number] Patient Account Number: [id number] Date of Birth/Sex: Treating RN: Sep 04, 1965 (55 y.o. Dorthula Perfect, Emeterio Reeve Primary Care Patra Gherardi: [place], Betty Other Clinician: Referring Jacoba Cherney: Treating Eilam Shrewsbury/Extender: Robson, Michael [place], Betty Weeks in Treatment: 3 Visit Information History Since Last Visit Added or deleted any medications: No Patient Arrived: Ambulatory Any new allergies or adverse reactions: No Arrival Time: 15:54 Had a fall or experienced change in No Accompanied By: self activities of daily living that may affect Transfer Assistance: None risk of falls: Patient Identification Verified: Yes Signs or symptoms of abuse/neglect since last visito No Secondary Verification Process Completed: Yes Hospitalized since last visit: No Patient Requires Transmission-Based Precautions: No Implantable device outside of the clinic excluding No Patient Has Alerts: No cellular tissue based products placed in the center since last visit: Has Dressing in Place as Prescribed: Yes Pain Present Now: No Electronic Signature(s) Signed: 08/07/2020 5:22:20 PM By: Karl Ito Entered By: Karl Ito on 08/07/2020 15:55:33 -------------------------------------------------------------------------------- Compression Therapy Details Patient Name: Date of Service: [name], [name] 08/07/2020 3:30 PM Medical Record Number: [id number] Patient Account Number: [id number] Date of Birth/Sex: Treating RN: 1965/10/28 (55 y.o. [name] Primary Care Gracelee Stemmler: [place], Betty Other Clinician: Referring Ashaz Robling: Treating Chrislynn Mosely/Extender: Robson, Michael [place], Betty Weeks in Treatment: 3 Compression Therapy Performed for Wound Assessment: Wound #1 Left,Lateral Ankle Performed By: Clinician Zandra Abts, RN Compression Type: Three Layer Post Procedure Diagnosis Same as Pre-procedure Electronic Signature(s) Signed: 08/07/2020 7:11:13 PM By: Zandra Abts RN, BSN Entered By: Zandra Abts on 08/07/2020 16:25:43 -------------------------------------------------------------------------------- Encounter Discharge Information Details Patient Name: Date of Service: [name], [name] 08/07/2020 3:30 PM Medical Record Number: [id number] Patient Account Number: [id number] Date of Birth/Sex: Treating RN: 09/27/1965 (55 y.o. Arta Silence Primary Care Deijah Spikes: [place], Betty Other Clinician: Referring Virgle Arth: Treating Garan Frappier/Extender: Robson, Michael [place], Betty Weeks in Treatment: 3 Encounter Discharge Information Items Post Procedure Vitals Discharge Condition: Stable Temperature (F): 99 Ambulatory Status: Ambulatory Pulse (bpm): 120 Discharge Destination: Home Respiratory Rate (breaths/min): 17 Transportation: Private Auto Blood Pressure (mmHg): 133/84 Accompanied By: self Schedule Follow-up Appointment: Yes Clinical Summary of Care: Electronic Signature(s) Signed: 08/07/2020 6:50:03 PM By: Shawn Stall Entered By: Shawn Stall on 08/07/2020 18:13:49 -------------------------------------------------------------------------------- Lower Extremity Assessment Details Patient Name: Date of Service: [name], [name] 08/07/2020 3:30 PM Medical Record Number: [id number] Patient Account Number: [id number] Date of Birth/Sex: Treating RN: 09-04-1965 (55 y.o. [name] Primary Care Kajal Scalici: [place], Betty Other Clinician: Referring Aqueelah Cotrell: Treating Anberlin Diez/Extender: Robson, Michael [place], Betty Weeks in Treatment: 3 Edema Assessment Assessed: Kyra Searles: No] [Right: No] Edema: [Left: Ye] [Right: s] Calf Left: Right: Point of Measurement: 36 cm From Medial Instep 32 cm Ankle Left: Right: Point of Measurement: 11 cm From Medial Instep 24 cm Vascular  Assessment Pulses: Dorsalis Pedis Palpable: [Left:Yes] Electronic Signature(s) Signed: 08/07/2020 7:11:13 PM By: Zandra Abts RN, BSN Entered By: Zandra Abts on 08/07/2020 16:22:34 -------------------------------------------------------------------------------- Multi Wound Chart Details Patient Name: Date of Service: [name], [name] 08/07/2020 3:30 PM Medical Record Number: [id number] Patient Account Number: [id number] Date of Birth/Sex: Treating RN: 18-Feb-1965 (55 y.o. [name] Primary Care Calie Buttrey: [place], Betty Other Clinician: Referring Anala Whisenant: Treating Christin Mccreedy/Extender: Robson, Michael [place], Betty Weeks in Treatment: 3 Vital Signs Height(in): 67 Pulse(bpm): 120 Weight(lbs): 270 Blood Pressure(mmHg): 133/84 Body Mass Index(BMI): 42 Temperature(F): 99.0 Respiratory Rate(breaths/min): 17 Photos: [1:No Photos Left, Lateral Ankle] [N/A:N/A N/A] Wound Location: [  1:Surgical Injury] [N/A:N/A] Wounding Event: [1:Open Surgical Wound] [N/A:N/A] Primary Etiology: [1:Anemia, Asthma] [N/A:N/A] Comorbid History: [1:02/15/2020] [N/A:N/A] Date Acquired: [1:3] [N/A:N/A] Weeks of Treatment: [1:Open] [N/A:N/A] Wound Status: [1:3.6x1x0.2] [N/A:N/A] Measurements L x W x D (cm) [1:2.827] [N/A:N/A] A (cm) : rea [1:0.565] [N/A:N/A] Volume (cm) : [1:36.90%] [N/A:N/A] % Reduction in A rea: [1:74.80%] [N/A:N/A] % Reduction in Volume: [1:Full Thickness Without Exposed] [N/A:N/A] Classification: [1:Support Structures Medium] [N/A:N/A] Exudate Amount: [1:Serosanguineous] [N/A:N/A] Exudate Type: [1:red, brown] [N/A:N/A] Exudate Color: [1:Distinct, outline attached] [N/A:N/A] Wound Margin: [1:Large (67-100%)] [N/A:N/A] Granulation Amount: [1:Red, Pink] [N/A:N/A] Granulation Quality: [1:None Present (0%)] [N/A:N/A] Necrotic Amount: [1:Fat Layer (Subcutaneous Tissue): Yes N/A] Exposed Structures: [1:Fascia: No Tendon: No Muscle: No Joint: No Bone: No Small (1-33%)]  [N/A:N/A] Epithelialization: [1:Cellular or Tissue Based Product] [N/A:N/A] Procedures Performed: [1:Compression Therapy] Treatment Notes Electronic Signature(s) Signed: 08/07/2020 5:13:41 PM By: Baltazar Najjar MD Signed: 08/07/2020 7:11:13 PM By: Zandra Abts RN, BSN Entered By: Baltazar Najjar on 08/07/2020 16:30:37 -------------------------------------------------------------------------------- Multi-Disciplinary Care Plan Details Patient Name: Date of Service: [name], [name] 08/07/2020 3:30 PM Medical Record Number: [id number] Patient Account Number: [id number] Date of Birth/Sex: Treating RN: [birth year] (55 y.o. [name] Primary Care Kamaria Lucia: [place], Betty Other Clinician: Referring Elias Dennington: Treating Kyara Boxer/Extender: Robson, Michael [place], Betty Weeks in Treatment: 3 Active Inactive Wound/Skin Impairment Nursing Diagnoses: Impaired tissue integrity Goals: Patient/caregiver will verbalize understanding of skin care regimen Date Initiated: 07/12/2020 Target Resolution Date: 08/16/2020 Goal Status: Active Ulcer/skin breakdown will have a volume reduction of 30% by week 4 Date Initiated: 07/12/2020 Target Resolution Date: 08/16/2020 Goal Status: Active Interventions: Assess patient/caregiver ability to obtain necessary supplies Assess patient/caregiver ability to perform ulcer/skin care regimen upon admission and as needed Assess ulceration(s) every visit Provide education on ulcer and skin care Treatment Activities: Skin care regimen initiated : 07/12/2020 Topical wound management initiated : 07/12/2020 Notes: Electronic Signature(s) Signed: 08/07/2020 7:11:13 PM By: Zandra Abts RN, BSN Entered By: Zandra Abts on 08/07/2020 18:36:42 -------------------------------------------------------------------------------- Pain Assessment Details Patient Name: Date of Service: [name], [name] 08/07/2020 3:30 PM Medical Record Number: [id number] Patient Account  Number: [id number] Date of Birth/Sex: Treating RN: [birth year] (55 y.o. [name] Primary Care Hiral Lukasiewicz: [place], Betty Other Clinician: Referring Daevion Navarette: Treating Salli Bodin/Extender: Robson, Michael [place], Betty Weeks in Treatment: 3 Active Problems Location of Pain Severity and Description of Pain Patient Has Paino No Site Locations Pain Management and Medication Current Pain Management: Electronic Signature(s) Signed: 08/07/2020 5:22:20 PM By: Karl Ito Signed: 08/07/2020 7:11:13 PM By: Zandra Abts RN, BSN Signed: 08/07/2020 7:11:13 PM By: Zandra Abts RN, BSN Entered By: Karl Ito on 08/07/2020 15:55:58 -------------------------------------------------------------------------------- Patient/Caregiver Education Details Patient Name: Date of Service: DEDE, DOBESH 7/6/2022andnbsp3:30 PM Medical Record Number: [id number] Patient Account Number: [id number] Date of Birth/Gender: Treating RN: 1966/01/21 (55 y.o. [name] Primary Care Physician: [place], Betty Other Clinician: Referring Physician: Treating Physician/Extender: Robson, Michael [place], Betty Weeks in Treatment: 3 Education Assessment Education Provided To: Patient Education Topics Provided Wound/Skin Impairment: Methods: Explain/Verbal Responses: State content correctly Electronic Signature(s) Signed: 08/07/2020 7:11:13 PM By: Zandra Abts RN, BSN Entered By: Zandra Abts on 08/07/2020 18:37:01 -------------------------------------------------------------------------------- Wound Assessment Details Patient Name: Date of Service: [name], [name] 08/07/2020 3:30 PM Medical Record Number: [id number] Patient Account Number: [id number] Date of Birth/Sex: Treating RN: 1965-04-23 (55 y.o. [name] Primary Care Chan Rosasco: [place], Betty Other Clinician: Referring Shian Goodnow: Treating Noel Rodier/Extender: Robson, Michael [place], Betty Weeks in Treatment: 3 Wound  Status Wound Number: 1 Primary Etiology: Open Surgical Wound Wound Location: Left,  Lateral Ankle Wound Status: Open Wounding Event: Surgical Injury Comorbid History: Anemia, Asthma Date Acquired: 02/15/2020 Weeks Of Treatment: 3 Clustered Wound: No Photos Wound Measurements Length: (cm) 3.6 Width: (cm) 1 Depth: (cm) 0.2 Area: (cm) 2.827 Volume: (cm) 0.565 % Reduction in Area: 36.9% % Reduction in Volume: 74.8% Epithelialization: Small (1-33%) Tunneling: No Undermining: No Wound Description Classification: Full Thickness Without Exposed Support Structures Wound Margin: Distinct, outline attached Exudate Amount: Medium Exudate Type: Serosanguineous Exudate Color: red, brown Foul Odor After Cleansing: No Slough/Fibrino No Wound Bed Granulation Amount: Large (67-100%) Exposed Structure Granulation Quality: Red, Pink Fascia Exposed: No Necrotic Amount: None Present (0%) Fat Layer (Subcutaneous Tissue) Exposed: Yes Tendon Exposed: No Muscle Exposed: No Joint Exposed: No Bone Exposed: No Treatment Notes Wound #1 (Ankle) Wound Laterality: Left, Lateral Cleanser Soap and Water Discharge Instruction: May shower and wash wound with dial antibacterial soap and water prior to dressing change. Wound Cleanser Discharge Instruction: Cleanse the wound with wound cleanser prior to applying a clean dressing using gauze sponges, not tissue or cotton balls. Peri-Wound Care Sween Lotion (Moisturizing lotion) Discharge Instruction: Apply moisturizing lotion as directed Topical Primary Dressing Apligraf Secondary Dressing Woven Gauze Sponge, Non-Sterile 4x4 in Discharge Instruction: Apply over primary dressing as directed. ABD Pad, 5x9 Discharge Instruction: Apply over primary dressing as directed. Secured With Compression Wrap ThreePress (3 layer compression wrap) Discharge Instruction: Apply three layer compression as directed. Compression Stockings Add-Ons Electronic  Signature(s) Signed: 08/07/2020 5:22:20 PM By: Karl Ito Signed: 08/07/2020 7:11:13 PM By: Zandra Abts RN, BSN Entered By: Karl Ito on 08/07/2020 17:11:05 -------------------------------------------------------------------------------- Vitals Details Patient Name: Date of Service: [name], Carolee 08/07/2020 3:30 PM Medical Record Number: [id number] Patient Account Number: [id number] Date of Birth/Sex: Treating RN: 12-18-1965 (55 y.o. [name] Primary Care Yamilet Mcfayden: [place], Betty Other Clinician: Referring Leira Regino: Treating Alaria Oconnor/Extender: Robson, Michael [place], Betty Weeks in Treatment: 3 Vital Signs Time Taken: 15:55 Temperature (F): 99.0 Height (in): 67 Pulse (bpm): 120 Weight (lbs): 270 Respiratory Rate (breaths/min): 17 Body Mass Index (BMI): 42.3 Blood Pressure (mmHg): 133/84 Reference Range: 80 - 120 mg / dl Electronic Signature(s) Signed: 08/07/2020 5:22:20 PM By: Karl Ito Entered By: Karl Ito on 08/07/2020 15:55:50

## 2020-08-15 ENCOUNTER — Other Ambulatory Visit: Payer: Self-pay

## 2020-08-15 ENCOUNTER — Encounter (HOSPITAL_BASED_OUTPATIENT_CLINIC_OR_DEPARTMENT_OTHER): Payer: 59 | Admitting: Internal Medicine

## 2020-08-15 DIAGNOSIS — L97828 Non-pressure chronic ulcer of other part of left lower leg with other specified severity: Secondary | ICD-10-CM | POA: Diagnosis not present

## 2020-08-15 NOTE — Progress Notes (Signed)
[name], [name] ([id number]) Visit Report for 08/15/2020 Arrival Information Details Patient Name: Date of Service: Lorraine, Hull 08/15/2020 3:15 PM Medical Record Number: [id number] Patient Account Number: [id number] Date of Birth/Sex: Treating RN: 09-27-1965 (55 y.o. [name] Primary Care Nykia Turko: [place], Betty Other Clinician: Referring Tyray Proch: Treating Tremon Sainvil/Extender: Robson, Michael [place], Betty Weeks in Treatment: 4 Visit Information History Since Last Visit Added or deleted any medications: No Patient Arrived: Ambulatory Any new allergies or adverse reactions: No Arrival Time: 15:38 Had a fall or experienced change in No Accompanied By: alone activities of daily living that may affect Transfer Assistance: None risk of falls: Patient Identification Verified: Yes Signs or symptoms of abuse/neglect since last visito No Secondary Verification Process Completed: Yes Hospitalized since last visit: No Patient Requires Transmission-Based Precautions: No Implantable device outside of the clinic excluding No Patient Has Alerts: No cellular tissue based products placed in the center since last visit: Has Dressing in Place as Prescribed: Yes Has Compression in Place as Prescribed: Yes Pain Present Now: No Electronic Signature(s) Signed: 08/15/2020 6:01:10 PM By: Zandra Abts RN, BSN Entered By: Zandra Abts on 08/15/2020 17:29:38 -------------------------------------------------------------------------------- Compression Therapy Details Patient Name: Date of Service: [name], [name] 08/15/2020 3:15 PM Medical Record Number: [id number] Patient Account Number: [id number] Date of Birth/Sex: Treating RN: Nov 10, 1965 (55 y.o. [name] Primary Care Shavonn Convey: [place], Betty Other Clinician: Referring [name]: Treating Jamal Pavon/Extender: Robson, Michael [place], Betty Weeks in Treatment: 4 Compression Therapy Performed for Wound Assessment: Wound #1  Left,Lateral Ankle Performed By: Clinician Zandra Abts, RN Compression Type: Three Emergency planning/management officer) Signed: 08/15/2020 6:01:10 PM By: Zandra Abts RN, BSN Entered By: Zandra Abts on 08/15/2020 17:30:34 -------------------------------------------------------------------------------- Encounter Discharge Information Details Patient Name: Date of Service: [name], [name] 08/15/2020 3:15 PM Medical Record Number: [id number] Patient Account Number: [id number] Date of Birth/Sex: Treating RN: 1965/03/15 (55 y.o. [name] Primary Care Rue Tinnel: [place], Betty Other Clinician: Referring Chasta Deshpande: Treating Kallista Pae/Extender: Robson, Michael [place], Betty Weeks in Treatment: 4 Encounter Discharge Information Items Discharge Condition: Stable Ambulatory Status: Ambulatory Discharge Destination: Home Transportation: Private Auto Accompanied By: alone Schedule Follow-up Appointment: Yes Clinical Summary of Care: Patient Declined Electronic Signature(s) Signed: 08/15/2020 6:01:10 PM By: Zandra Abts RN, BSN Entered By: Zandra Abts on 08/15/2020 17:31:05 -------------------------------------------------------------------------------- Wound Assessment Details Patient Name: Date of Service: [name], [name] 08/15/2020 3:15 PM Medical Record Number: [id number] Patient Account Number: [id number] Date of Birth/Sex: Treating RN: 1965/12/01 (55 y.o. [name] Primary Care Majestic Molony: [place], Betty Other Clinician: Referring Acen Craun: Treating Alp Goldwater/Extender: Robson, Michael [place], Betty Weeks in Treatment: 4 Wound Status Wound Number: 1 Primary Etiology: Open Surgical Wound Wound Location: Left, Lateral Ankle Wound Status: Open Wounding Event: Surgical Injury Comorbid History: Anemia, Asthma Date Acquired: 02/15/2020 Weeks Of Treatment: 4 Clustered Wound: No Wound Measurements Length: (cm) 3.6 Width: (cm) 1 Depth: (cm) 0.2 Area: (cm)  2.827 Volume: (cm) 0.565 % Reduction in Area: 36.9% % Reduction in Volume: 74.8% Epithelialization: Small (1-33%) Tunneling: No Undermining: No Wound Description Classification: Full Thickness Without Exposed Support Structures Wound Margin: Distinct, outline attached Exudate Amount: Medium Exudate Type: Serosanguineous Exudate Color: red, brown Foul Odor After Cleansing: No Slough/Fibrino No Wound Bed Granulation Amount: Large (67-100%) Exposed Structure Granulation Quality: Red, Pink Fascia Exposed: No Necrotic Amount: None Present (0%) Fat Layer (Subcutaneous Tissue) Exposed: Yes Tendon Exposed: No Muscle Exposed: No Joint Exposed: No Bone Exposed: No Treatment Notes Wound #1 (Ankle) Wound Laterality: Left, Lateral Cleanser Soap and Water Discharge Instruction: May shower and wash  wound with dial antibacterial soap and water prior to dressing change. Wound Cleanser Discharge Instruction: Cleanse the wound with wound cleanser prior to applying a clean dressing using gauze sponges, not tissue or cotton balls. Peri-Wound Care Sween Lotion (Moisturizing lotion) Discharge Instruction: Apply moisturizing lotion as directed Topical Primary Dressing Apligraf Secondary Dressing Woven Gauze Sponge, Non-Sterile 4x4 in Discharge Instruction: Apply over primary dressing as directed. ABD Pad, 5x9 Discharge Instruction: Apply over primary dressing as directed. Secured With Compression Wrap ThreePress (3 layer compression wrap) Discharge Instruction: Apply three layer compression as directed. Compression Stockings Add-Ons Electronic Signature(s) Signed: 08/15/2020 6:01:10 PM By: Zandra Abts RN, BSN Entered By: Zandra Abts on 08/15/2020 17:30:10 -------------------------------------------------------------------------------- Vitals Details Patient Name: Date of Service: [name], [name] 08/15/2020 3:15 PM Medical Record Number: [id number] Patient Account Number:  [id number] Date of Birth/Sex: Treating RN: Jan 05, 1966 (55 y.o. [name] Primary Care Valon Glasscock: [place], Betty Other Clinician: Referring Kalandra Masters: Treating Lamiyah Schlotter/Extender: Robson, Michael [place], Betty Weeks in Treatment: 4 Vital Signs Time Taken: 15:38 Temperature (F): 98.6 Height (in): 67 Pulse (bpm): 91 Weight (lbs): 270 Respiratory Rate (breaths/min): 18 Body Mass Index (BMI): 42.3 Blood Pressure (mmHg): 144/83 Reference Range: 80 - 120 mg / dl Electronic Signature(s) Signed: 08/15/2020 6:01:10 PM By: Zandra Abts RN, BSN Entered By: Zandra Abts on 08/15/2020 17:29:55

## 2020-08-20 NOTE — Progress Notes (Signed)
TRAVIS, PURK ([id number]) Visit Report for 08/15/2020 SuperBill Details Patient Name: Date of Service: [name], [name] 08/15/2020 Medical Record Number: [id number] Patient Account Number: [id number] Date of Birth/Sex: Treating RN: 1965-08-14 (55 y.o. [name] Primary Care Provider: [place], Betty Other Clinician: Referring Provider: Treating Provider/Extender: Eladia Frame [place], Betty Weeks in Treatment: 4 Diagnosis Coding ICD-10 Codes Code Description T81.31XD Disruption of external operation (surgical) wound, not elsewhere classified, subsequent encounter I87.312 Chronic venous hypertension (idiopathic) with ulcer of left lower extremity L97.828 Non-pressure chronic ulcer of other part of left lower leg with other specified severity Facility Procedures CPT4 Code Description Modifier Quantity [id number] (Facility Use Only) [phone] - APPLY MULTLAY COMPRS LWR LT LEG 1 Electronic Signature(s) Signed: 08/15/2020 6:01:10 PM By: Zandra Abts RN, BSN Signed: 08/20/2020 5:24:30 PM By: Baltazar Najjar MD Entered By: Zandra Abts on 08/15/2020 17:31:17

## 2020-08-21 ENCOUNTER — Other Ambulatory Visit: Payer: Self-pay

## 2020-08-21 ENCOUNTER — Encounter (HOSPITAL_BASED_OUTPATIENT_CLINIC_OR_DEPARTMENT_OTHER): Payer: 59 | Admitting: Internal Medicine

## 2020-08-21 DIAGNOSIS — L97828 Non-pressure chronic ulcer of other part of left lower leg with other specified severity: Secondary | ICD-10-CM | POA: Diagnosis not present

## 2020-08-26 NOTE — Progress Notes (Signed)
[name] ([id number]) Visit Report for 08/21/2020 Arrival Information Details Patient Name: Date of Service: [name], [name] 08/21/2020 3:30 PM Medical Record Number: [id number] Patient Account Number: [id number] Date of Birth/Sex: Treating RN: 04/17/1965 (55 y.o. [name] Primary Care Nillie Bartolotta: [place], Betty Other Clinician: Referring Sherese Heyward: Treating Tavarius Grewe/Extender: Robson, Michael [place], Betty Weeks in Treatment: 5 Visit Information History Since Last Visit Added or deleted any medications: No Patient Arrived: Ambulatory Any new allergies or adverse reactions: No Arrival Time: 16:04 Had a fall or experienced change in Yes Accompanied By: self activities of daily living that may affect Transfer Assistance: None risk of falls: Patient Identification Verified: Yes Signs or symptoms of abuse/neglect since last visito No Secondary Verification Process Completed: Yes Hospitalized since last visit: No Patient Requires Transmission-Based Precautions: No Implantable device outside of the clinic excluding No Patient Has Alerts: No cellular tissue based products placed in the center since last visit: Has Dressing in Place as Prescribed: Yes Pain Present Now: No Notes patient fell this past Saturday tripped on the side walk scraped her knee an elbow but overall she is okay. Electronic Signature(s) Signed: 08/23/2020 10:49:45 AM By: Sandre Kitty Entered By: Sandre Kitty on 08/21/2020 16:05:57 -------------------------------------------------------------------------------- Compression Therapy Details Patient Name: Date of Service: [name] 08/21/2020 3:30 PM Medical Record Number: [id number] Patient Account Number: [id number] Date of Birth/Sex: Treating RN: 08/05/1965 (55 y.o. [name] Primary Care Naija Troost: [place], Betty Other Clinician: Referring Nicolet Griffy: Treating Jacey Eckerson/Extender: Robson, Michael [place], Betty Weeks in  Treatment: 5 Compression Therapy Performed for Wound Assessment: Wound #1 Left,Lateral Ankle Performed By: Clinician Levan Hurst, RN Compression Type: Three Layer Post Procedure Diagnosis Same as Pre-procedure Electronic Signature(s) Signed: 08/26/2020 4:44:07 PM By: Levan Hurst RN, BSN Entered By: Levan Hurst on 08/21/2020 16:32:44 -------------------------------------------------------------------------------- Encounter Discharge Information Details Patient Name: Date of Service: [name] 08/21/2020 3:30 PM Medical Record Number: [id number] Patient Account Number: [id number] Date of Birth/Sex: Treating RN: 1965-10-14 (55 y.o. [name], [name] Primary Care Melitza Metheny: [place], Betty Other Clinician: Referring Shahad Mazurek: Treating Amma Crear/Extender: Robson, Michael [place], Betty Weeks in Treatment: 5 Encounter Discharge Information Items Post Procedure Vitals Discharge Condition: Stable Temperature (F): 98.7 Ambulatory Status: Ambulatory Pulse (bpm): 74 Discharge Destination: Home Respiratory Rate (breaths/min): 17 Transportation: Private Auto Blood Pressure (mmHg): 110/70 Accompanied By: self Schedule Follow-up Appointment: Yes Clinical Summary of Care: Patient Declined Electronic Signature(s) Signed: 08/22/2020 7:48:44 PM By: Rhae Hammock RN Entered By: Rhae Hammock on 08/21/2020 16:54:20 -------------------------------------------------------------------------------- Multi Wound Chart Details Patient Name: Date of Service: [name] 08/21/2020 3:30 PM Medical Record Number: [id number] Patient Account Number: [id number] Date of Birth/Sex: Treating RN: 1965/02/08 (55 y.o. [name] Primary Care Lynnea Vandervoort: [place], Betty Other Clinician: Referring Shalev Helminiak: Treating Curley Hogen/Extender: Robson, Michael [place], Betty Weeks in Treatment: 5 Vital Signs Height(in): 67 Pulse(bpm): 94 Weight(lbs): 270 Blood Pressure(mmHg):  142/87 Body Mass Index(BMI): 42 Temperature(F): 98.3 Respiratory Rate(breaths/min): 18 Photos: [1:No Photos Left, Lateral Ankle] [N/A:N/A N/A] Wound Location: [1:Surgical Injury] [N/A:N/A] Wounding Event: [1:Open Surgical Wound] [N/A:N/A] Primary Etiology: [1:Anemia, Asthma] [N/A:N/A] Comorbid History: [1:02/15/2020] [N/A:N/A] Date Acquired: [1:5] [N/A:N/A] Weeks of Treatment: [1:Open] [N/A:N/A] Wound Status: [1:3.2x0.7x0.1] [N/A:N/A] Measurements L x W x D (cm) [1:1.759] [N/A:N/A] A (cm) : rea [1:0.176] [N/A:N/A] Volume (cm) : [1:60.70%] [N/A:N/A] % Reduction in A rea: [1:92.10%] [N/A:N/A] % Reduction in Volume: [1:Full Thickness Without Exposed] [N/A:N/A] Classification: [1:Support Structures Medium] [N/A:N/A] Exudate Amount: [1:Serosanguineous] [N/A:N/A] Exudate Type: [1:red, brown] [N/A:N/A] Exudate Color: [1:Distinct, outline attached] [N/A:N/A] Wound Margin: [1:Large (67-100%)] [N/A:N/A] Granulation  Amount: [1:Red, Hyper-granulation] [N/A:N/A] Granulation Quality: [1:None Present (0%)] [N/A:N/A] Necrotic Amount: [1:Fat Layer (Subcutaneous Tissue): Yes N/A] Exposed Structures: [1:Fascia: No Tendon: No Muscle: No Joint: No Bone: No Small (1-33%)] [N/A:N/A] Epithelialization: [1:Cellular or Tissue Based Product] [N/A:N/A] Procedures Performed: [1:Chemical Cauterization Compression Therapy] Treatment Notes Electronic Signature(s) Signed: 08/21/2020 4:58:30 PM By: Linton Ham MD Signed: 08/26/2020 4:44:07 PM By: Levan Hurst RN, BSN Entered By: Linton Ham on 08/21/2020 16:50:17 -------------------------------------------------------------------------------- Multi-Disciplinary Care Plan Details Patient Name: Date of Service: [name] 08/21/2020 3:30 PM Medical Record Number: [id number] Patient Account Number: [id number] Date of Birth/Sex: Treating RN: 04-03-1965 (55 y.o. [name] Primary Care Cassidi Modesitt: [place], Betty Other  Clinician: Referring Benji Poynter: Treating Bronte Sabado/Extender: Robson, Michael [place], Betty Weeks in Treatment: 5 Active Inactive Wound/Skin Impairment Nursing Diagnoses: Impaired tissue integrity Goals: Patient/caregiver will verbalize understanding of skin care regimen Date Initiated: 07/12/2020 Target Resolution Date: 09/20/2020 Goal Status: Active Ulcer/skin breakdown will have a volume reduction of 30% by week 4 Date Initiated: 07/12/2020 Date Inactivated: 08/21/2020 Target Resolution Date: 08/16/2020 Goal Status: Met Interventions: Assess patient/caregiver ability to obtain necessary supplies Assess patient/caregiver ability to perform ulcer/skin care regimen upon admission and as needed Assess ulceration(s) every visit Provide education on ulcer and skin care Treatment Activities: Skin care regimen initiated : 07/12/2020 Topical wound management initiated : 07/12/2020 Notes: Electronic Signature(s) Signed: 08/26/2020 4:44:07 PM By: Levan Hurst RN, BSN Entered By: Levan Hurst on 08/21/2020 18:13:14 -------------------------------------------------------------------------------- Pain Assessment Details Patient Name: Date of Service: [place], [place] 08/21/2020 3:30 PM Medical Record Number: [id number] Patient Account Number: [id number] Date of Birth/Sex: Treating RN: [birth year] (55 y.o. [name] Primary Care Latika Kronick: [place], Betty Other Clinician: Referring Pippa Hanif: Treating Zakaria Sedor/Extender: Robson, Michael [place], Betty Weeks in Treatment: 5 Active Problems Location of Pain Severity and Description of Pain Patient Has Paino No Site Locations Pain Management and Medication Current Pain Management: Electronic Signature(s) Signed: 08/23/2020 10:49:45 AM By: Sandre Kitty Signed: 08/26/2020 4:44:07 PM By: Levan Hurst RN, BSN Entered By: Sandre Kitty on 08/21/2020  16:04:59 -------------------------------------------------------------------------------- Patient/Caregiver Education Details Patient Name: Date of Service: KEYLI, DUROSS 7/20/2022andnbsp3:30 PM Medical Record Number: [id number] Patient Account Number: [id number] Date of Birth/Gender: Treating RN: 05-03-1965 (55 y.o. [name] Primary Care Physician: [place], Betty Other Clinician: Referring Physician: Treating Physician/Extender: Robson, Michael [place], Betty Weeks in Treatment: 5 Education Assessment Education Provided To: Patient Education Topics Provided Wound/Skin Impairment: Methods: Explain/Verbal Responses: State content correctly Electronic Signature(s) Signed: 08/26/2020 4:44:07 PM By: Levan Hurst RN, BSN Entered By: Levan Hurst on 08/21/2020 18:13:22 -------------------------------------------------------------------------------- Wound Assessment Details Patient Name: Date of Service: Lorraine RNEY, [place] 08/21/2020 3:30 PM Medical Record Number: [id number] Patient Account Number: [id number] Date of Birth/Sex: Treating RN: 1965-05-31 (55 y.o. [name] Primary Care Naw [place]ala: [place], Betty Other Clinician: Referring Ramonia Mcclaran: Treating Jannet Calip/Extender: Robson, Michael [place], Betty Weeks in Treatment: 5 Wound Status Wound Number: 1 Primary Etiology: Open Surgical Wound Wound Location: Left, Lateral Ankle Wound Status: Open Wounding Event: Surgical Injury Comorbid History: Anemia, Asthma Date Acquired: 02/15/2020 Weeks Of Treatment: 5 Clustered Wound: No Photos Photo Uploaded By: Donavan Burnet on 08/22/2020 13:42:57 Wound Measurements Length: (cm) 3.2 Width: (cm) 0.7 Depth: (cm) 0.1 Area: (cm) 1.759 Volume: (cm) 0.176 % Reduction in Area: 60.7% % Reduction in Volume: 92.1% Epithelialization: Small (1-33%) Wound Description Classification: Full Thickness Without Exposed Support Structures Wound Margin: Distinct, outline  attached Exudate Amount: Medium Exudate Type: Serosanguineous Exudate Color: red, brown Foul Odor After Cleansing: No Slough/Fibrino No Wound Bed Granulation Amount:  Large (67-100%) Exposed Structure Granulation Quality: Red, Hyper-granulation Fascia Exposed: No Necrotic Amount: None Present (0%) Fat Layer (Subcutaneous Tissue) Exposed: Yes Tendon Exposed: No Muscle Exposed: No Joint Exposed: No Bone Exposed: No Treatment Notes Wound #1 (Ankle) Wound Laterality: Left, Lateral Cleanser Soap and Water Discharge Instruction: May shower and wash wound with dial antibacterial soap and water prior to dressing change. Wound Cleanser Discharge Instruction: Cleanse the wound with wound cleanser prior to applying a clean dressing using gauze sponges, not tissue or cotton balls. Peri-Wound Care Sween Lotion (Moisturizing lotion) Discharge Instruction: Apply moisturizing lotion as directed Topical Primary Dressing Apligraf Secondary Dressing Woven Gauze Sponge, Non-Sterile 4x4 in Discharge Instruction: Apply over primary dressing as directed. ABD Pad, 5x9 Discharge Instruction: Apply over primary dressing as directed. Secured With Compression Wrap ThreePress (3 layer compression wrap) Discharge Instruction: Apply three layer compression as directed. Compression Stockings Add-Ons Electronic Signature(s) Signed: 08/26/2020 4:44:07 PM By: Levan Hurst RN, BSN Entered By: Levan Hurst on 08/21/2020 16:32:14 -------------------------------------------------------------------------------- [place] Details Patient Name: Date of Service: Lorraine RNEY, [place] 08/21/2020 3:30 PM Medical Record Number: [id number] Patient Account Number: [id number] Date of Birth/Sex: Treating RN: Oct 31, 1965 (55 y.o. [name] Primary Care Kerington Hildebrant: [place], Betty Other Clinician: Referring Laree Garron: Treating Fusaye Wachtel/Extender: Robson, Michael [place], Betty Weeks in Treatment: 5 Vital Signs Time  Taken: 16:04 Temperature (F): 98.3 Height (in): 67 Pulse (bpm): 94 Weight (lbs): 270 Respiratory Rate (breaths/min): 18 Body Mass Index (BMI): 42.3 Blood Pressure (mmHg): 142/87 Reference Range: 80 - 120 mg / dl Electronic Signature(s) Signed: 08/23/2020 10:49:45 AM By: Sandre Kitty Entered By: Sandre Kitty on 08/21/2020 16:04:51

## 2020-08-26 NOTE — Progress Notes (Signed)
DANY, HARTEN ([id number]) Visit Report for 08/21/2020 Cellular or Tissue Based Product Details Patient Name: Date of Service: Lorraine, Hull 08/21/2020 3:30 PM Medical Record Number: [id number] Patient Account Number: [id number] Date of Birth/Sex: Treating RN: [birth year] (55 y.o. [name] Primary Care Provider: [place], Betty Other Clinician: Referring Provider: Treating Provider/Extender: Taurus Alamo [place], Betty Weeks in Treatment: 5 Cellular or Tissue Based Product Type Wound #1 Left,Lateral Ankle Applied to: Performed By: Physician Lorraine Caul., MD Cellular or Tissue Based Product Type: Apligraf Level of Consciousness (Pre-procedure): Awake and Alert Pre-procedure Verification/Time Out Yes - 16:30 Taken: Location: trunk / arms / legs Wound Size (sq cm): 2.24 Product Size (sq cm): 44 Waste Size (sq cm): 22 Waste Reason: wound size Amount of Product Applied (sq cm): 22 Lot #: [id number] Order #: 2 Expiration Date: 08/23/2020 Fenestrated: Yes Instrument: Blade Reconstituted: Yes Solution Type: normal saline Solution Amount: 6 ml Lot #: [id number] Solution Expiration Date: 10/03/2021 Secured: Yes Secured With: Steri-Strips Dressing Applied: Yes Primary Dressing: gauze, abd, compression wrap Procedural Pain: 0 Post Procedural Pain: 0 Response to Treatment: Procedure was tolerated well Level of Consciousness (Post- Awake and Alert procedure): Post Procedure Diagnosis Same as Pre-procedure Electronic Signature(s) Signed: 08/21/2020 4:58:30 PM By: Baltazar Najjar MD Entered By: Baltazar Najjar on 08/21/2020 16:50:37 -------------------------------------------------------------------------------- HPI Details Patient Name: Date of Service: [name], [name] 08/21/2020 3:30 PM Medical Record Number: [id number] Patient Account Number: [id number] Date of Birth/Sex: Treating RN: 1965-12-12 (55 y.o. [name] Primary Care Provider: [place],  Betty Other Clinician: Referring Provider: Treating Provider/Extender: Jamair Cato [place], Betty Weeks in Treatment: 5 History of Present Illness HPI Description: ADMISSION 07/12/2020; This is a otherwise well 55 year old woman who suffered a fall on the ice in January. She developed a left ankle fibular fracture requiring an ORIF on February 15, 2020. She is followed by Dr. Margarita Rana and she has been using wet-to-dry dressings for a long period now however the wound is only slowly contracting and she was referred for our review of this. 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 She has some edema in her lower extremities and she uses some form of compression stocking. Past medical history includes hypothyroidism, irritable bowel syndrome, anemia. She may have some degree of chronic venous insufficiency necessitating use of compression stockings. She is not a diabetic ABI in the left leg in our clinic was noncompressible however her peripheral pulses are easily palpable 6/17; Wound is healthier looking but still non viable surface under illumination. using iodoflex 6/24; patient's wound is smaller. Still some debris on the surface. Using Iodoflex. I hope to transition today to silver collagen but I think that may be overly ambitious 6/29; wound is about the same size as last visit however the surface of the wound looks a lot better. We have been using Iodoflex. Actually things look is good as I was hoping for today and I think she might be ready to actually go ahead with Apligraf. We talked about this. She follows up with her surgeon tomorrow 7/6; wound is about the same size we have been using Iodoflex. We applied Apligraf #1 today. I think this is her best chance to get this to close 7/20; Apligraf #2. Wound is smaller Electronic Signature(s) Signed: 08/21/2020 4:58:30 PM By: Baltazar Najjar MD Entered By: Baltazar Najjar on 08/21/2020  16:51:21 -------------------------------------------------------------------------------- Chemical Cauterization Details Patient Name: Date of Service: [name], [name] 08/21/2020 3:30 PM Medical Record  Number: [id number] Patient Account Number: [id number] Date of Birth/Sex: Treating RN: 02-06-1965 (55 y.o. Lorraine LinkF) Lynch, Shatara Primary Care Provider: [place], Betty Other Clinician: Referring Provider: Treating Provider/Extender: Margareth Kanner [place], Betty Weeks in Treatment: 5 Procedure Performed for: Wound #1 Left,Lateral Ankle Performed By: Physician Lorraine Caulobson, Newman Waren G., MD Post Procedure Diagnosis Same as Pre-procedure Notes silver nitrate Electronic Signature(s) Signed: 08/21/2020 4:58:30 PM By: Baltazar Najjarobson, Terriana Barreras MD Entered By: Baltazar Najjarobson, Jermiya Reichl on 08/21/2020 16:50:26 -------------------------------------------------------------------------------- Physical Exam Details Patient Name: Date of Service: [name], [name] 08/21/2020 3:30 PM Medical Record Number: [id number] Patient Account Number: [id number] Date of Birth/Sex: Treating RN: 02-06-1965 (55 y.o. Lorraine LinkF) Lynch, Shatara Primary Care Provider: [place], Betty Other Clinician: Referring Provider: Treating Provider/Extender: Darrin Koman [place], Betty Weeks in Treatment: 5 Constitutional Sitting or standing Blood Pressure is within target range for patient.. Pulse regular and within target range for patient.Marland Kitchen. Respirations regular, non-labored and within target range.. Temperature is normal and within the target range for the patient.Marland Kitchen. Appears in no distress. Notes Wound exam; surface of the wound is slightly hyper granulated but overall surface area is smaller wound is receding nicely. No evidence of surrounding infection Electronic Signature(s) Signed: 08/21/2020 4:58:30 PM By: Baltazar Najjarobson, Hymie Gorr MD Entered By: Baltazar Najjarobson, Sophya Vanblarcom on 08/21/2020  16:51:54 -------------------------------------------------------------------------------- Physician Orders Details Patient Name: Date of Service: [name], [name] 08/21/2020 3:30 PM Medical Record Number: [id number] Patient Account Number: [id number] Date of Birth/Sex: Treating RN: 02-06-1965 (55 y.o. Lorraine LinkF) Lynch, Shatara Primary Care Provider: [place], Betty Other Clinician: Referring Provider: Treating Provider/Extender: Rahman Ferrall [place], Betty Weeks in Treatment: 5 Verbal / Phone Orders: No Diagnosis Coding ICD-10 Coding Code Description T81.31XD Disruption of external operation (surgical) wound, not elsewhere classified, subsequent encounter I87.312 Chronic venous hypertension (idiopathic) with ulcer of left lower extremity L97.828 Non-pressure chronic ulcer of other part of left lower leg with other specified severity Follow-up Appointments ppointment in 2 weeks. - with Leonard SchwartzHoyt Return A Nurse Visit: - 1 week for rewrap Cellular or Tissue Based Products Wound #1 Left,Lateral Ankle Cellular or Tissue Based Product Type: - Apligraf #2 Cellular or Tissue Based Product applied to wound bed, secured with steri-strips, cover with Adaptic or Mepitel. (DO NOT REMOVE). Bathing/ Shower/ Hygiene May shower with protection but do not get wound dressing(s) wet. Edema Control - Lymphedema / SCD / Other Elevate legs to the level of the heart or above for 30 minutes daily and/or when sitting, a frequency of: - throughout the day Avoid standing for long periods of time. Exercise regularly Additional Orders / Instructions Follow Nutritious Diet - Increase protein Wound Treatment Wound #1 - Ankle Wound Laterality: Left, Lateral Cleanser: Soap and Water 1 x Per Week/30 Days Discharge Instructions: May shower and wash wound with dial antibacterial soap and water prior to dressing change. Cleanser: Wound Cleanser 1 x Per Week/30 Days Discharge Instructions: Cleanse the wound with wound cleanser  prior to applying a clean dressing using gauze sponges, not tissue or cotton balls. Peri-Wound Care: Sween Lotion (Moisturizing lotion) 1 x Per Week/30 Days Discharge Instructions: Apply moisturizing lotion as directed Prim Dressing: Apligraf ary 1 x Per Week/30 Days Secondary Dressing: Woven Gauze Sponge, Non-Sterile 4x4 in 1 x Per Week/30 Days Discharge Instructions: Apply over primary dressing as directed. Secondary Dressing: ABD Pad, 5x9 1 x Per Week/30 Days Discharge Instructions: Apply over primary dressing as directed. Compression Wrap: ThreePress (3 layer compression wrap) 1 x Per Week/30 Days Discharge Instructions: Apply three layer compression as directed. Electronic Signature(s) Signed: 08/21/2020 4:58:30 PM By: Baltazar Najjarobson, Safaa Stingley  MD Signed: 08/26/2020 4:44:07 PM By: Zandra Abts RN, BSN Entered By: Zandra Abts on 08/21/2020 16:37:12 -------------------------------------------------------------------------------- Problem List Details Patient Name: Date of Service: [name], [name] 08/21/2020 3:30 PM Medical Record Number: [id number] Patient Account Number: [id number] Date of Birth/Sex: Treating RN: [birth year] (55 y.o. [name] Primary Care Provider: [place], Betty Other Clinician: Referring Provider: Treating Provider/Extender: Silvio Sausedo [place], Betty Weeks in Treatment: 5 Active Problems ICD-10 Encounter Code Description Active Date MDM Diagnosis T81.31XD Disruption of external operation (surgical) wound, not elsewhere classified, 07/12/2020 No Yes subsequent encounter I87.312 Chronic venous hypertension (idiopathic) with ulcer of left lower extremity 07/12/2020 No Yes L97.828 Non-pressure chronic ulcer of other part of left lower leg with other specified 07/12/2020 No Yes severity Inactive Problems Resolved Problems Electronic Signature(s) Signed: 08/21/2020 4:58:30 PM By: Baltazar Najjar MD Entered By: Baltazar Najjar on 08/21/2020  16:50:10 -------------------------------------------------------------------------------- Progress Note Details Patient Name: Date of Service: [name], [name] 08/21/2020 3:30 PM Medical Record Number: [id number] Patient Account Number: [id number] Date of Birth/Sex: Treating RN: 1965/09/04 (55 y.o. [name] Primary Care Provider: [place], Betty Other Clinician: Referring Provider: Treating Provider/Extender: Dionisio Aragones [place], Betty Weeks in Treatment: 5 Subjective History of Present Illness (HPI) ADMISSION 07/12/2020; This is a otherwise well 55 year old woman who suffered a fall on the ice in January. She developed a left ankle fibular fracture requiring an ORIF on February 15, 2020. She is followed by Dr. Margarita Rana and she has been using wet-to-dry dressings for a long period now however the wound is only slowly contracting and she was referred for our review of this. 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 She has some edema in her lower extremities and she uses some form of compression stocking. Past medical history includes hypothyroidism, irritable bowel syndrome, anemia. She may have some degree of chronic venous insufficiency necessitating use of compression stockings. She is not a diabetic ABI in the left leg in our clinic was noncompressible however her peripheral pulses are easily palpable 6/17; Wound is healthier looking but still non viable surface under illumination. using iodoflex 6/24; patient's wound is smaller. Still some debris on the surface. Using Iodoflex. I hope to transition today to silver collagen but I think that may be overly ambitious 6/29; wound is about the same size as last visit however the surface of the wound looks a lot better. We have been using Iodoflex. Actually things look is good as I was hoping for today and I think she might be ready to actually go ahead with Apligraf. We talked about  this. She follows up with her surgeon tomorrow 7/6; wound is about the same size we have been using Iodoflex. We applied Apligraf #1 today. I think this is her best chance to get this to close 7/20; Apligraf #2. Wound is smaller Objective Constitutional Sitting or standing Blood Pressure is within target range for patient.. Pulse regular and within target range for patient.Marland Kitchen Respirations regular, non-labored and within target range.. Temperature is normal and within the target range for the patient.Marland Kitchen Appears in no distress. Vitals Time Taken: 4:04 PM, Height: 67 in, Weight: 270 lbs, BMI: 42.3, Temperature: 98.3 F, Pulse: 94 bpm, Respiratory Rate: 18 breaths/min, Blood Pressure: 142/87 mmHg. General Notes: Wound exam; surface of the wound is slightly hyper granulated but overall surface area is smaller wound is receding nicely. No evidence of surrounding infection Integumentary (Hair, Skin) Wound #1 status is Open. Original cause of  wound was Surgical Injury. The date acquired was: 02/15/2020. The wound has been in treatment 5 weeks. The wound is located on the Left,Lateral Ankle. The wound measures 3.2cm length x 0.7cm width x 0.1cm depth; 1.759cm^2 area and 0.176cm^3 volume. There is Fat Layer (Subcutaneous Tissue) exposed. There is a medium amount of serosanguineous drainage noted. The wound margin is distinct with the outline attached to the wound base. There is large (67-100%) red, hyper - granulation within the wound bed. There is no necrotic tissue within the wound bed. Assessment Active Problems ICD-10 Disruption of external operation (surgical) wound, not elsewhere classified, subsequent encounter Chronic venous hypertension (idiopathic) with ulcer of left lower extremity Non-pressure chronic ulcer of other part of left lower leg with other specified severity Procedures Wound #1 Pre-procedure diagnosis of Wound #1 is an Open Surgical Wound located on the Left,Lateral Ankle. A skin  graft procedure using a bioengineered skin substitute/cellular or tissue based product was performed by Lorraine Caul., MD. Apligraf was applied and secured with Steri-Strips. 22 sq cm of product was utilized and 22 sq cm was wasted due to wound size. Post Application, gauze, abd, compression wrap was applied. A Time Out was conducted at 16:30, prior to the start of the procedure. The procedure was tolerated well with a pain level of 0 throughout and a pain level of 0 following the procedure. Post procedure Diagnosis Wound #1: Same as Pre-Procedure . Pre-procedure diagnosis of Wound #1 is an Open Surgical Wound located on the Left,Lateral Ankle . There was a Three Layer Compression Therapy Procedure by Zandra Abts, RN. Post procedure Diagnosis Wound #1: Same as Pre-Procedure Pre-procedure diagnosis of Wound #1 is an Open Surgical Wound located on the Left,Lateral Ankle . An Chemical Cauterization procedure was performed by Lorraine Caul., MD. Post procedure Diagnosis Wound #1: Same as Pre-Procedure Notes: silver nitrate Plan Follow-up Appointments: Return Appointment in 2 weeks. - with Leonard Schwartz Nurse Visit: - 1 week for rewrap Cellular or Tissue Based Products: Wound #1 Left,Lateral Ankle: Cellular or Tissue Based Product Type: - Apligraf #2 Cellular or Tissue Based Product applied to wound bed, secured with steri-strips, cover with Adaptic or Mepitel. (DO NOT REMOVE). Bathing/ Shower/ Hygiene: May shower with protection but do not get wound dressing(s) wet. Edema Control - Lymphedema / SCD / Other: Elevate legs to the level of the heart or above for 30 minutes daily and/or when sitting, a frequency of: - throughout the day Avoid standing for long periods of time. Exercise regularly Additional Orders / Instructions: Follow Nutritious Diet - Increase protein WOUND #1: - Ankle Wound Laterality: Left, Lateral Cleanser: Soap and Water 1 x Per Week/30 Days Discharge Instructions: May  shower and wash wound with dial antibacterial soap and water prior to dressing change. Cleanser: Wound Cleanser 1 x Per Week/30 Days Discharge Instructions: Cleanse the wound with wound cleanser prior to applying a clean dressing using gauze sponges, not tissue or cotton balls. Peri-Wound Care: Sween Lotion (Moisturizing lotion) 1 x Per Week/30 Days Discharge Instructions: Apply moisturizing lotion as directed Prim Dressing: Apligraf 1 x Per Week/30 Days ary Secondary Dressing: Woven Gauze Sponge, Non-Sterile 4x4 in 1 x Per Week/30 Days Discharge Instructions: Apply over primary dressing as directed. Secondary Dressing: ABD Pad, 5x9 1 x Per Week/30 Days Discharge Instructions: Apply over primary dressing as directed. Com pression Wrap: ThreePress (3 layer compression wrap) 1 x Per Week/30 Days Discharge Instructions: Apply three layer compression as directed. 1. Apligraf #2 in the standard fashion. I  did reduce the hypergranulation with silver nitrate 2. Careful attention to the dimensions next time in 2 weeks also the status of the hypergranulation. If this hyper granulates without improvement in surface area then further applications may be contraindicated Electronic Signature(s) Signed: 08/21/2020 4:58:30 PM By: Baltazar Najjar MD Entered By: Baltazar Najjar on 08/21/2020 16:52:58 -------------------------------------------------------------------------------- SuperBill Details Patient Name: Date of Service: [name], [name] 08/21/2020 Medical Record Number: [id number] Patient Account Number: [id number] Date of Birth/Sex: Treating RN: 03-29-1965 (55 y.o. [name] Primary Care Provider: [place], Betty Other Clinician: Referring Provider: Treating Provider/Extender: Dmitriy Gair [place], Betty Weeks in Treatment: 5 Diagnosis Coding ICD-10 Codes Code Description T81.31XD Disruption of external operation (surgical) wound, not elsewhere classified, subsequent  encounter I87.312 Chronic venous hypertension (idiopathic) with ulcer of left lower extremity L97.828 Non-pressure chronic ulcer of other part of left lower leg with other specified severity Facility Procedures CPT4 Code: [id number] Description: (Facility Use Only) Apligraf 1 SQ CM Modifier: Quantity: 44 CPT4 Code: [id number] IC I L Description: 15271 - SKIN SUB GRAFT TRNK/ARM/LEG D-10 Diagnosis Description 87.312 Chronic venous hypertension (idiopathic) with ulcer of left lower extremity 97.828 Non-pressure chronic ulcer of other part of left lower leg with other specified severi Modifier: ty Quantity: 1 Physician Procedures : CPT4 Code Description Modifier [id number] - WC PHYS SKIN SUB GRAFT TRNK/ARM/LEG ICD-10 Diagnosis Description I87.312 Chronic venous hypertension (idiopathic) with ulcer of left lower extremity L97.828 Non-pressure chronic ulcer of other part of left  lower leg with other specified severity Quantity: 1 Electronic Signature(s) Signed: 08/22/2020 5:24:41 PM By: Baltazar Najjar MD Signed: 08/26/2020 4:44:07 PM By: Zandra Abts RN, BSN Previous Signature: 08/21/2020 4:58:30 PM Version By: Baltazar Najjar MD Entered By: Zandra Abts on 08/21/2020 18:13:37

## 2020-08-29 ENCOUNTER — Encounter (HOSPITAL_BASED_OUTPATIENT_CLINIC_OR_DEPARTMENT_OTHER): Payer: 59 | Admitting: Internal Medicine

## 2020-08-29 ENCOUNTER — Other Ambulatory Visit: Payer: Self-pay

## 2020-08-29 DIAGNOSIS — L97828 Non-pressure chronic ulcer of other part of left lower leg with other specified severity: Secondary | ICD-10-CM | POA: Diagnosis not present

## 2020-08-29 NOTE — Progress Notes (Signed)
DELRAE, HAGEY ([id number]) Visit Report for 08/29/2020 SuperBill Details Patient Name: Date of Service: [name], [name] 08/29/2020 Medical Record Number: [id number] Patient Account Number: [id number] Date of Birth/Sex: Treating RN: 07-25-1965 (55 y.o. [name] Primary Care Provider: [place], Betty Other Clinician: Referring Provider: Treating Provider/Extender: Daviyon Widmayer [place], Betty Weeks in Treatment: 6 Diagnosis Coding ICD-10 Codes Code Description T81.31XD Disruption of external operation (surgical) wound, not elsewhere classified, subsequent encounter I87.312 Chronic venous hypertension (idiopathic) with ulcer of left lower extremity L97.828 Non-pressure chronic ulcer of other part of left lower leg with other specified severity Facility Procedures CPT4 Code Description Modifier Quantity [id number] (Facility Use Only) [phone] - APPLY MULTLAY COMPRS LWR LT LEG 1 ICD-10 Diagnosis Description L97.828 Non-pressure chronic ulcer of other part of left lower leg with other specified severity I87.312 Chronic venous hypertension (idiopathic) with ulcer of left lower extremity Electronic Signature(s) Signed: 08/29/2020 5:12:15 PM By: Geralyn Corwin DO Signed: 08/29/2020 5:31:46 PM By: Antonieta Iba Entered By: Antonieta Iba on 08/29/2020 16:04:56

## 2020-08-29 NOTE — Progress Notes (Signed)
JILLAYNE, WITTE ([id number]) Visit Report for 08/29/2020 Arrival Information Details Patient Name: Date of Service: KHARA, RENAUD 08/29/2020 3:15 PM Medical Record Number: [id number] Patient Account Number: [id number] Date of Birth/Sex: Treating RN: [birth year] (55 y.o. [name] Primary Care Derrell Milanes: [place], Betty Other Clinician: Referring Dayle Sherpa: Treating Mysti Haley/Extender: Hoffman, Jessica [place], Betty Weeks in Treatment: 6 Visit Information History Since Last Visit Added or deleted any medications: No Patient Arrived: Ambulatory Any new allergies or adverse reactions: No Arrival Time: 15:47 Had a fall or experienced change in No Transfer Assistance: None activities of daily living that may affect Patient Identification Verified: Yes risk of falls: Secondary Verification Process Completed: Yes Signs or symptoms of abuse/neglect since last visito No Patient Requires Transmission-Based Precautions: No Hospitalized since last visit: No Patient Has Alerts: No Implantable device outside of the clinic excluding No cellular tissue based products placed in the center since last visit: Has Dressing in Place as Prescribed: Yes Has Compression in Place as Prescribed: Yes Pain Present Now: No Electronic Signature(s) Signed: 08/29/2020 5:31:46 PM By: Antonieta Iba Entered By: Antonieta Iba on 08/29/2020 15:47:32 -------------------------------------------------------------------------------- Compression Therapy Details Patient Name: Date of Service: [name], Aireal 08/29/2020 3:15 PM Medical Record Number: [id number] Patient Account Number: [id number] Date of Birth/Sex: Treating RN: 1965-02-24 (55 y.o. [name] Primary Care Clemmie Buelna: [place], Betty Other Clinician: Referring Kanijah Groseclose: Treating Christell Steinmiller/Extender: Hoffman, Jessica [place], Betty Weeks in Treatment: 6 Compression Therapy Performed for Wound Assessment: Wound #1 Left,Lateral Ankle Performed  By: Clinician Antonieta Iba, RN Compression Type: Three Layer Electronic Signature(s) Signed: 08/29/2020 5:31:46 PM By: Antonieta Iba Entered By: Antonieta Iba on 08/29/2020 15:48:41 -------------------------------------------------------------------------------- Encounter Discharge Information Details Patient Name: Date of Service: [name], Tianah 08/29/2020 3:15 PM Medical Record Number: [id number] Patient Account Number: [id number] Date of Birth/Sex: Treating RN: 23-Feb-1965 (55 y.o. [name] Primary Care Wynette Jersey: [place], Betty Other Clinician: Referring Axyl Sitzman: Treating Kwame Ryland/Extender: Hoffman, Jessica [place], Betty Weeks in Treatment: 6 Encounter Discharge Information Items Discharge Condition: Stable Ambulatory Status: Ambulatory Discharge Destination: Home Transportation: Private Auto Schedule Follow-up Appointment: Yes Clinical Summary of Care: Provided on 08/29/2020 Form Type Recipient Paper Patient Patient Electronic Signature(s) Signed: 08/29/2020 5:31:46 PM By: Antonieta Iba Entered By: Antonieta Iba on 08/29/2020 16:04:36 -------------------------------------------------------------------------------- Patient/Caregiver Education Details Patient Name: Date of Service: Tillman Abide 7/28/2022andnbsp3:15 PM Medical Record Number: [id number] Patient Account Number: [id number] Date of Birth/Gender: Treating RN: August 11, 1965 (55 y.o. [name] Primary Care Physician: [place], Betty Other Clinician: Referring Physician: Treating Physician/Extender: Hoffman, Jessica [place], Betty Weeks in Treatment: 6 Education Assessment Education Provided To: Patient Education Topics Provided Venous: Methods: Explain/Verbal Responses: State content correctly Wound/Skin Impairment: Methods: Explain/Verbal Responses: State content correctly Electronic Signature(s) Signed: 08/29/2020 5:31:46 PM By: Antonieta Iba Entered By: Antonieta Iba on  08/29/2020 16:04:14 -------------------------------------------------------------------------------- Wound Assessment Details Patient Name: Date of Service: [name], Dione 08/29/2020 3:15 PM Medical Record Number: [id number] Patient Account Number: [id number] Date of Birth/Sex: Treating RN: 04-05-1965 (55 y.o. [name] Primary Care Amber Williard: [place], Betty Other Clinician: Referring Exodus Kutzer: Treating Laasya Peyton/Extender: Hoffman, Jessica [place], Betty Weeks in Treatment: 6 Wound Status Wound Number: 1 Primary Etiology: Open Surgical Wound Wound Location: Left, Lateral Ankle Wound Status: Open Wounding Event: Surgical Injury Comorbid History: Anemia, Asthma Date Acquired: 02/15/2020 Weeks Of Treatment: 6 Clustered Wound: No Wound Measurements Length: (cm) 3.2 Width: (cm) 0.7 Depth: (cm) 0.1 Area: (cm) 1.759 Volume: (cm) 0.176 % Reduction in Area: 60.7% % Reduction in Volume: 92.1% Epithelialization: Medium (34-66%) Tunneling: No Undermining: No Wound  Description Classification: Full Thickness Without Exposed Support Structures Wound Margin: Distinct, outline attached Exudate Amount: Medium Exudate Type: Serosanguineous Exudate Color: red, brown Foul Odor After Cleansing: No Slough/Fibrino No Wound Bed Granulation Amount: Large (67-100%) Exposed Structure Granulation Quality: Red, Hyper-granulation Fascia Exposed: No Necrotic Amount: None Present (0%) Fat Layer (Subcutaneous Tissue) Exposed: Yes Tendon Exposed: No Muscle Exposed: No Joint Exposed: No Bone Exposed: No Treatment Notes Wound #1 (Ankle) Wound Laterality: Left, Lateral Cleanser Soap and Water Discharge Instruction: May shower and wash wound with dial antibacterial soap and water prior to dressing change. Wound Cleanser Discharge Instruction: Cleanse the wound with wound cleanser prior to applying a clean dressing using gauze sponges, not tissue or cotton balls. Peri-Wound Care Sween  Lotion (Moisturizing lotion) Discharge Instruction: Apply moisturizing lotion as directed Topical Primary Dressing Apligraf Secondary Dressing Woven Gauze Sponge, Non-Sterile 4x4 in Discharge Instruction: Apply over primary dressing as directed. ABD Pad, 5x9 Discharge Instruction: Apply over primary dressing as directed. Secured With Compression Wrap ThreePress (3 layer compression wrap) Discharge Instruction: Apply three layer compression as directed. Compression Stockings Add-Ons Electronic Signature(s) Signed: 08/29/2020 5:31:46 PM By: Antonieta Iba Entered By: Antonieta Iba on 08/29/2020 15:48:16 -------------------------------------------------------------------------------- Vitals Details Patient Name: Date of Service: [name], Miliana 08/29/2020 3:15 PM Medical Record Number: [id number] Patient Account Number: [id number] Date of Birth/Sex: Treating RN: 01-Jul-1965 (55 y.o. [name] Primary Care Desma Wilkowski: [place], Betty Other Clinician: Referring Torrin Crihfield: Treating Lanell Carpenter/Extender: Hoffman, Jessica [place], Betty Weeks in Treatment: 6 Vital Signs Time Taken: 15:47 Temperature (F): 98.5 Height (in): 67 Pulse (bpm): 101 Weight (lbs): 270 Respiratory Rate (breaths/min): 18 Body Mass Index (BMI): 42.3 Blood Pressure (mmHg): 132/80 Reference Range: 80 - 120 mg / dl Electronic Signature(s) Signed: 08/29/2020 5:31:46 PM By: Antonieta Iba Entered By: Antonieta Iba on 08/29/2020 15:47:53

## 2020-09-04 ENCOUNTER — Other Ambulatory Visit: Payer: Self-pay

## 2020-09-04 ENCOUNTER — Encounter (HOSPITAL_BASED_OUTPATIENT_CLINIC_OR_DEPARTMENT_OTHER): Payer: 59 | Attending: Physician Assistant | Admitting: Physician Assistant

## 2020-09-04 DIAGNOSIS — T8131XA Disruption of external operation (surgical) wound, not elsewhere classified, initial encounter: Secondary | ICD-10-CM | POA: Insufficient documentation

## 2020-09-04 DIAGNOSIS — Y838 Other surgical procedures as the cause of abnormal reaction of the patient, or of later complication, without mention of misadventure at the time of the procedure: Secondary | ICD-10-CM | POA: Insufficient documentation

## 2020-09-04 DIAGNOSIS — Z09 Encounter for follow-up examination after completed treatment for conditions other than malignant neoplasm: Secondary | ICD-10-CM | POA: Diagnosis not present

## 2020-09-05 NOTE — Progress Notes (Addendum)
LICET, DUNPHY ([id number]) Visit Report for 09/04/2020 Chief Complaint Document Details Patient Name: Date of Service: [name] 09/04/2020 3:00 PM Medical Record Number: [id number] Patient Account Number: [id number] Date of Birth/Sex: Treating RN: 09-Jun-1965 (55 y.o. [name] Primary Care Provider: [place], Hull Other Clinician: Referring Provider: Treating Provider/Extender: [name] [name] Weeks in Treatment: 7 Information Obtained from: Patient Chief Complaint 07/12/2020; patient arrives in clinic for review of a left lateral ankle surgical wound dehiscence Electronic Signature(s) Signed: 09/04/2020 3:18:34 PM By: Lorraine Kelp PA-C Entered By: [name] on 09/04/2020 15:18:33 -------------------------------------------------------------------------------- HPI Details Patient Name: Date of Service: [name], [name] 09/04/2020 3:00 PM Medical Record Number: [id number] Patient Account Number: [id number] Date of Birth/Sex: Treating RN: Jan 17, 1966 (55 y.o. [name] Primary Care Provider: [place], Hull Other Clinician: Referring Provider: Treating Provider/Extender: [name] [name] Weeks in Treatment: 7 History of Present Illness HPI Description: ADMISSION 07/12/2020; This is a otherwise well 55 year old woman who suffered a fall on the ice in January. She developed a left ankle fibular fracture requiring an ORIF on February 15, 2020. She is followed by Dr. Margarita Hull and she has been using wet-to-dry dressings for a long period now however the wound is only slowly contracting and she was referred for our review of this. 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 She has some edema in her lower extremities and she uses some form of compression stocking. Past medical history includes hypothyroidism, irritable bowel syndrome, anemia. She may have some degree of chronic venous  insufficiency necessitating use of compression stockings. She is not a diabetic ABI in the left leg in our clinic was noncompressible however her peripheral pulses are easily palpable 6/17; Wound is healthier looking but still non viable surface under illumination. using iodoflex 6/24; patient's wound is smaller. Still some debris on the surface. Using Iodoflex. I hope to transition today to silver collagen but I think that may be overly ambitious 6/29; wound is about the same size as last visit however the surface of the wound looks a lot better. We have been using Iodoflex. Actually things look is good as I was hoping for today and I think she might be ready to actually go ahead with Apligraf. We talked about this. She follows up with her surgeon tomorrow 7/6; wound is about the same size we have been using Iodoflex. We applied Apligraf #1 today. I think this is her best chance to get this to close 7/20; Apligraf #2. Wound is smaller 09/04/2020 upon evaluation today patient appears to be doing excellent in regard to her wound. In fact I do not see any need that I think skin to be necessary today for applying Apligraf as I think she seems to be completely healed. Fortunately there is no signs of active infection. No fevers, chills, nausea, vomiting, or diarrhea. Electronic Signature(s) Signed: 09/04/2020 4:35:30 PM By: Lorraine Kelp PA-C Entered By: [name] on 09/04/2020 16:35:30 -------------------------------------------------------------------------------- Physical Exam Details Patient Name: Date of Service: [name], [name] 09/04/2020 3:00 PM Medical Record Number: [id number] Patient Account Number: [id number] Date of Birth/Sex: Treating RN: May 20, 1965 (55 y.o. [name] Primary Care Provider: [place], Hull Other Clinician: Referring Provider: Treating Provider/Extender: Lorraine III, Ashir Kunz [name] Weeks in Treatment: 7 Constitutional Well-nourished and well-hydrated  in no acute distress. Respiratory normal breathing without difficulty. Psychiatric this patient is able to make decisions and demonstrates good  insight into disease process. Alert and Oriented x 3. pleasant and cooperative. Notes Upon inspection patient's wound bed actually showed signs of good granulation epithelization at this point. There does not appear to be any evidence of active infection which is great news. Overall I am extremely pleased with where we stand currently. No need for sharp debridement today. Electronic Signature(s) Signed: 09/04/2020 4:35:53 PM By: Lorraine Kelp PA-C Entered By: [name] on 09/04/2020 16:35:52 -------------------------------------------------------------------------------- Physician Orders Details Patient Name: Date of Service: [name], [name] 09/04/2020 3:00 PM Medical Record Number: [id number] Patient Account Number: [id number] Date of Birth/Sex: Treating RN: 10/22/1965 (55 y.o. [name] Primary Care Provider: [place], Hull Other Clinician: Referring Provider: Treating Provider/Extender: [name] [name] Weeks in Treatment: 7 Verbal / Phone Orders: No Diagnosis Coding ICD-10 Coding Code Description T81.31XD Disruption of external operation (surgical) wound, not elsewhere classified, subsequent encounter I87.312 Chronic venous hypertension (idiopathic) with ulcer of left lower extremity L97.828 Non-pressure chronic ulcer of other part of left lower leg with other specified severity Follow-up Appointments Return Appointment in 1 week. Bathing/ Shower/ Hygiene May shower with protection but do not get wound dressing(s) wet. Edema Control - Lymphedema / SCD / Other Elevate legs to the level of the heart or above for 30 minutes daily and/or when sitting, a frequency of: - throughout the day Avoid standing for long periods of time. Exercise regularly Compression stocking or Garment 20-30 mm/Hg pressure to: - pt to  get knee high stockings Additional Orders / Instructions Follow Nutritious Diet - Increase protein Wound Treatment Wound #1 - Ankle Wound Laterality: Left, Lateral Cleanser: Soap and Water 1 x Per Week/30 Days Discharge Instructions: May shower and wash wound with dial antibacterial soap and water prior to dressing change. Cleanser: Wound Cleanser 1 x Per Week/30 Days Discharge Instructions: Cleanse the wound with wound cleanser prior to applying a clean dressing using gauze sponges, not tissue or cotton balls. Peri-Wound Care: Sween Lotion (Moisturizing lotion) 1 x Per Week/30 Days Discharge Instructions: Apply moisturizing lotion as directed Prim Dressing: KerraCel Ag Gelling Fiber Dressing, 2x2 in (silver alginate) 1 x Per Week/30 Days ary Discharge Instructions: Apply silver alginate to wound bed as instructed Prim Dressing: Apligraf ary 1 x Per Week/30 Days Secondary Dressing: Woven Gauze Sponge, Non-Sterile 4x4 in 1 x Per Week/30 Days Discharge Instructions: Apply over primary dressing as directed. Compression Wrap: ThreePress (3 layer compression wrap) 1 x Per Week/30 Days Discharge Instructions: Apply three layer compression as directed. Electronic Signature(s) Signed: 09/04/2020 5:22:07 PM By: Lorraine Kelp PA-C Signed: 09/04/2020 5:57:23 PM By: Zenaida Deed RN, BSN Entered By: Zenaida Deed on 09/04/2020 16:34:39 -------------------------------------------------------------------------------- Problem List Details Patient Name: Date of Service: [name], [name] 09/04/2020 3:00 PM Medical Record Number: [id number] Patient Account Number: [id number] Date of Birth/Sex: Treating RN: 1965/09/20 (55 y.o. [name] Primary Care Provider: [place], Hull Other Clinician: Referring Provider: Treating Provider/Extender: Lorraine III, Dametra Whetsel [name] Weeks in Treatment: 7 Active Problems ICD-10 Encounter Code Description Active Date MDM Diagnosis T81.31XD Disruption  of external operation (surgical) wound, not elsewhere classified, 07/12/2020 No Yes subsequent encounter I87.312 Chronic venous hypertension (idiopathic) with ulcer of left lower extremity 07/12/2020 No Yes L97.828 Non-pressure chronic ulcer of other part of left lower leg with other specified 07/12/2020 No Yes severity Inactive Problems Resolved Problems Electronic Signature(s) Signed: 09/04/2020 3:18:24 PM By: Lorraine Kelp PA-C Entered By: [name] on 09/04/2020 15:18:23 -------------------------------------------------------------------------------- Progress Note Details Patient Name: Date of  Service: [name], [name] 09/04/2020 3:00 PM Medical Record Number: [id number] Patient Account Number: [id number] Date of Birth/Sex: Treating RN: 1965-05-19 (55 y.o. [name] Primary Care Provider: [place], Hull Other Clinician: Referring Provider: Treating Provider/Extender: Lorraine III, Roston Grunewald [name] Weeks in Treatment: 7 Subjective Chief Complaint Information obtained from Patient 07/12/2020; patient arrives in clinic for review of a left lateral ankle surgical wound dehiscence History of Present Illness (HPI) ADMISSION 07/12/2020; This is a otherwise well 55 year old woman who suffered a fall on the ice in January. She developed a left ankle fibular fracture requiring an ORIF on February 15, 2020. She is followed by Dr. Margarita Hull and she has been using wet-to-dry dressings for a long period now however the wound is only slowly contracting and she was referred for our review of this. 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 She has some edema in her lower extremities and she uses some form of compression stocking. Past medical history includes hypothyroidism, irritable bowel syndrome, anemia. She may have some degree of chronic venous insufficiency necessitating use of compression stockings. She is not a diabetic ABI in the left  leg in our clinic was noncompressible however her peripheral pulses are easily palpable 6/17; Wound is healthier looking but still non viable surface under illumination. using iodoflex 6/24; patient's wound is smaller. Still some debris on the surface. Using Iodoflex. I hope to transition today to silver collagen but I think that may be overly ambitious 6/29; wound is about the same size as last visit however the surface of the wound looks a lot better. We have been using Iodoflex. Actually things look is good as I was hoping for today and I think she might be ready to actually go ahead with Apligraf. We talked about this. She follows up with her surgeon tomorrow 7/6; wound is about the same size we have been using Iodoflex. We applied Apligraf #1 today. I think this is her best chance to get this to close 7/20; Apligraf #2. Wound is smaller 09/04/2020 upon evaluation today patient appears to be doing excellent in regard to her wound. In fact I do not see any need that I think skin to be necessary today for applying Apligraf as I think she seems to be completely healed. Fortunately there is no signs of active infection. No fevers, chills, nausea, vomiting, or diarrhea. Objective Constitutional Well-nourished and well-hydrated in no acute distress. Vitals Time Taken: 3:21 PM, Height: 67 in, Weight: 270 lbs, BMI: 42.3, Temperature: 98.3 F, Pulse: 89 bpm, Respiratory Rate: 18 breaths/min, Blood Pressure: 145/85 mmHg. Respiratory normal breathing without difficulty. Psychiatric this patient is able to make decisions and demonstrates good insight into disease process. Alert and Oriented x 3. pleasant and cooperative. General Notes: Upon inspection patient's wound bed actually showed signs of good granulation epithelization at this point. There does not appear to be any evidence of active infection which is great news. Overall I am extremely pleased with where we stand currently. No need for sharp  debridement today. Integumentary (Hair, Skin) Wound #1 status is Open. Original cause of wound was Surgical Injury. The date acquired was: 02/15/2020. The wound has been in treatment 7 weeks. The wound is located on the Left,Lateral Ankle. The wound measures 0.1cm length x 0.1cm width x 0.1cm depth; 0.008cm^2 area and 0.001cm^3 volume. There is no tunneling or undermining noted. There is a small amount of serosanguineous drainage noted. The wound margin is  distinct with the outline attached to the wound base. There is no granulation within the wound bed. There is a large (67-100%) amount of necrotic tissue within the wound bed including Eschar. Assessment Active Problems ICD-10 Disruption of external operation (surgical) wound, not elsewhere classified, subsequent encounter Chronic venous hypertension (idiopathic) with ulcer of left lower extremity Non-pressure chronic ulcer of other part of left lower leg with other specified severity Procedures Wound #1 Pre-procedure diagnosis of Wound #1 is an Open Surgical Wound located on the Left,Lateral Ankle . There was a Three Layer Compression Therapy Procedure by Shawn Stalleaton, Bobbi, RN. Post procedure Diagnosis Wound #1: Same as Pre-Procedure Plan Follow-up Appointments: Return Appointment in 1 week. Bathing/ Shower/ Hygiene: May shower with protection but do not get wound dressing(s) wet. Edema Control - Lymphedema / SCD / Other: Elevate legs to the level of the heart or above for 30 minutes daily and/or when sitting, a frequency of: - throughout the day Avoid standing for long periods of time. Exercise regularly Compression stocking or Garment 20-30 mm/Hg pressure to: - pt to get knee high stockings Additional Orders / Instructions: Follow Nutritious Diet - Increase protein WOUND #1: - Ankle Wound Laterality: Left, Lateral Cleanser: Soap and Water 1 x Per Week/30 Days Discharge Instructions: May shower and wash wound with dial antibacterial soap  and water prior to dressing change. Cleanser: Wound Cleanser 1 x Per Week/30 Days Discharge Instructions: Cleanse the wound with wound cleanser prior to applying a clean dressing using gauze sponges, not tissue or cotton balls. Peri-Wound Care: Sween Lotion (Moisturizing lotion) 1 x Per Week/30 Days Discharge Instructions: Apply moisturizing lotion as directed Prim Dressing: KerraCel Ag Gelling Fiber Dressing, 2x2 in (silver alginate) 1 x Per Week/30 Days ary Discharge Instructions: Apply silver alginate to wound bed as instructed Prim Dressing: Apligraf 1 x Per Week/30 Days ary Secondary Dressing: Woven Gauze Sponge, Non-Sterile 4x4 in 1 x Per Week/30 Days Discharge Instructions: Apply over primary dressing as directed. Com pression Wrap: ThreePress (3 layer compression wrap) 1 x Per Week/30 Days Discharge Instructions: Apply three layer compression as directed. 1. Would recommend that we going continue with the wound care measures as before and the patient is in agreement with the plan. This includes the use of the silver alginate dressing which I think is doing a great job. 2. I am also can recommend that we have the patient continue to monitor for any signs of worsening from the standpoint of infection. Again I do not think there is any evidence here and she mainly be looking for any signs of increased pain but honestly I think she is getting do quite well. This in fact appears to be completely healed. 3. I am also can recommend that we continue with a 3 layer compression wrap for 1 more week just to make sure that everything toppings out. Also think in the meantime she should try to get some compression socks from elastic therapy so she will have those for initiating therapy with what she is completely healed. We will see patient back for reevaluation in 1 week here in the clinic. If anything worsens or changes patient will contact our office for additional recommendations. Electronic  Signature(s) Signed: 09/04/2020 4:36:34 PM By: Lorraine KelpStone III, Somnang Mahan PA-C Entered By: Lorraine KelpStone III, Chaddrick Brue on 09/04/2020 16:36:34 -------------------------------------------------------------------------------- SuperBill Details Patient Name: Date of Service: [name], [name] 09/04/2020 Medical Record Number: [id number] Patient Account Number: [id number] Date of Birth/Sex: Treating RN: 05/19/1965 (55 y.o. Lorraine StandardF) Boehlein, Linda Primary Care Provider:  [place], Hull Other Clinician: Referring Provider: Treating Provider/Extender: Lorraine III, Bazil Dhanani [name] Weeks in Treatment: 7 Diagnosis Coding ICD-10 Codes Code Description T81.31XD Disruption of external operation (surgical) wound, not elsewhere classified, subsequent encounter I87.312 Chronic venous hypertension (idiopathic) with ulcer of left lower extremity L97.828 Non-pressure chronic ulcer of other part of left lower leg with other specified severity Facility Procedures CPT4 Code: [id number] Description: (Facility Use Only) [phone] - APPLY MULTLAY COMPRS LWR LT LEG Modifier: Quantity: 1 Physician Procedures : CPT4 Code Description Modifier [id number] - WC PHYS LEVEL 3 - EST PT ICD-10 Diagnosis Description T81.31XD Disruption of external operation (surgical) wound, not elsewhere classified, subsequent encounter I87.312 Chronic venous hypertension  (idiopathic) with ulcer of left lower extremity L97.828 Non-pressure chronic ulcer of other part of left lower leg with other specified severity Quantity: 1 Electronic Signature(s) Signed: 09/04/2020 5:22:07 PM By: Lorraine Kelp PA-C Signed: 09/04/2020 5:57:23 PM By: Zenaida Deed RN, BSN Previous Signature: 09/04/2020 4:36:57 PM Version By: Lorraine Kelp PA-C Entered By: Zenaida Deed on 09/04/2020 17:07:44

## 2020-09-06 NOTE — Progress Notes (Signed)
TANAJA, GANGER ([id number]) Visit Report for 09/04/2020 Arrival Information Details Patient Name: Date of Service: [name], [name] 09/04/2020 3:00 PM Medical Record Number: [id number] Patient Account Number: [id number] Date of Birth/Sex: Treating RN: 12/05/1965 (55 y.o. [name] Primary Care Demika Langenderfer: [place], Betty Other Clinician: Referring Mieke Brinley: Treating Shataria Crist/Extender: Stone III, Hoyt [place], Betty Weeks in Treatment: 7 Visit Information History Since Last Visit Added or deleted any medications: No Patient Arrived: Ambulatory Any new allergies or adverse reactions: No Arrival Time: 15:21 Had a fall or experienced change in No Accompanied By: alone activities of daily living that may affect Transfer Assistance: None risk of falls: Patient Identification Verified: Yes Signs or symptoms of abuse/neglect since last visito No Secondary Verification Process Completed: Yes Hospitalized since last visit: No Patient Requires Transmission-Based Precautions: No Implantable device outside of the clinic excluding No Patient Has Alerts: No cellular tissue based products placed in the center since last visit: Has Dressing in Place as Prescribed: Yes Has Compression in Place as Prescribed: Yes Pain Present Now: No Electronic Signature(s) Signed: 09/06/2020 12:35:09 PM By: Zandra Abts RN, BSN Entered By: Zandra Abts on 09/04/2020 15:21:54 -------------------------------------------------------------------------------- Compression Therapy Details Patient Name: Date of Service: [name] 09/04/2020 3:00 PM Medical Record Number: [id number] Patient Account Number: [id number] Date of Birth/Sex: Treating RN: 20-Mar-1965 (55 y.o. Tommye Standard Primary Care Naida Escalante: [place], Betty Other Clinician: Referring Jamyria Ozanich: Treating Maylani Embree/Extender: Stone III, Hoyt [place], Betty Weeks in Treatment: 7 Compression Therapy Performed for Wound Assessment: Wound #1  Left,Lateral Ankle Performed By: Clinician Shawn Stall, RN Compression Type: Three Layer Post Procedure Diagnosis Same as Pre-procedure Electronic Signature(s) Signed: 09/04/2020 5:57:23 PM By: Zenaida Deed RN, BSN Entered By: Zenaida Deed on 09/04/2020 16:32:35 -------------------------------------------------------------------------------- Lower Extremity Assessment Details Patient Name: Date of Service: [name] 09/04/2020 3:00 PM Medical Record Number: [id number] Patient Account Number: [id number] Date of Birth/Sex: Treating RN: 20-Feb-1965 (55 y.o. [name] Primary Care Kemya Shed: [place], Betty Other Clinician: Referring Lilibeth Opie: Treating Darlyne Schmiesing/Extender: Stone III, Hoyt [place], Betty Weeks in Treatment: 7 Edema Assessment Assessed: [Left: No] [Right: No] Edema: [Left: Ye] [Right: s] Calf Left: Right: Point of Measurement: 36 cm From Medial Instep 41.5 cm Ankle Left: Right: Point of Measurement: 11 cm From Medial Instep 26 cm Knee To Floor Left: Right: From Medial Instep 43 cm Vascular Assessment Pulses: Dorsalis Pedis Palpable: [Left:Yes] Electronic Signature(s) Signed: 09/04/2020 5:57:23 PM By: Zenaida Deed RN, BSN Signed: 09/06/2020 12:35:09 PM By: Zandra Abts RN, BSN Entered By: Zenaida Deed on 09/04/2020 16:36:58 -------------------------------------------------------------------------------- Multi-Disciplinary Care Plan Details Patient Name: Date of Service: [name] 09/04/2020 3:00 PM Medical Record Number: [id number] Patient Account Number: [id number] Date of Birth/Sex: Treating RN: 05/15/1965 (55 y.o. Tommye Standard Primary Care Tyrica Afzal: [place], Betty Other Clinician: Referring Liv Rallis: Treating Xerxes Agrusa/Extender: Stone III, Hoyt [place], Betty Weeks in Treatment: 7 Multidisciplinary Care Plan reviewed with physician Active Inactive Electronic Signature(s) Signed: 09/04/2020 5:57:23 PM By: Zenaida Deed  RN, BSN Entered By: Zenaida Deed on 09/04/2020 16:32:08 -------------------------------------------------------------------------------- Pain Assessment Details Patient Name: Date of Service: [name] 09/04/2020 3:00 PM Medical Record Number: [id number] Patient Account Number: [id number] Date of Birth/Sex: Treating RN: Jan 19, 1966 (55 y.o. [name] Primary Care Kalief Kattner: [place], Betty Other Clinician: Referring Anah Billard: Treating Kyre Jeffries/Extender: Stone III, Hoyt [place], Betty Weeks in Treatment: 7 Active Problems Location of Pain Severity and Description of Pain Patient Has Paino No Site Locations Pain Management and Medication Current Pain Management: Electronic Signature(s) Signed: 09/06/2020 12:35:09 PM By: Zandra Abts  RN, BSN Entered By: Zandra Abts on 09/04/2020 15:27:41 -------------------------------------------------------------------------------- Patient/Caregiver Education Details Patient Name: Date of Service: [name] 8/3/2022andnbsp3:00 PM Medical Record Number: [id number] Patient Account Number: [id number] Date of Birth/Gender: Treating RN: May 05, 1965 (55 y.o. Tommye Standard Primary Care Physician: [place], Betty Other Clinician: Referring Physician: Treating Physician/Extender: Stone III, Hoyt [place], Betty Weeks in Treatment: 7 Education Assessment Education Provided To: Patient Education Topics Provided Wound/Skin Impairment: Methods: Explain/Verbal Responses: Reinforcements needed, State content correctly Electronic Signature(s) Signed: 09/04/2020 5:57:23 PM By: Zenaida Deed RN, BSN Entered By: Zenaida Deed on 09/04/2020 16:30:37 -------------------------------------------------------------------------------- Wound Assessment Details Patient Name: Date of Service: [name] 09/04/2020 3:00 PM Medical Record Number: [id number] Patient Account Number: [id number] Date of Birth/Sex: Treating  RN: 05/13/1965 (55 y.o. [name] Primary Care Veronda Gabor: [place], Betty Other Clinician: Referring Navia Lindahl: Treating Masoud Nyce/Extender: Stone III, Hoyt [place], Betty Weeks in Treatment: 7 Wound Status Wound Number: 1 Primary Etiology: Open Surgical Wound Wound Location: Left, Lateral Ankle Wound Status: Open Wounding Event: Surgical Injury Comorbid History: Anemia, Asthma Date Acquired: 02/15/2020 Weeks Of Treatment: 7 Clustered Wound: No Photos Wound Measurements Length: (cm) 0.1 Width: (cm) 0.1 Depth: (cm) 0.1 Area: (cm) 0.008 Volume: (cm) 0.001 % Reduction in Area: 99.8% % Reduction in Volume: 100% Epithelialization: Large (67-100%) Tunneling: No Undermining: No Wound Description Classification: Full Thickness Without Exposed Support Structu Wound Margin: Distinct, outline attached Exudate Amount: Small Exudate Type: Serosanguineous Exudate Color: red, brown res Foul Odor After Cleansing: No Slough/Fibrino No Wound Bed Granulation Amount: None Present (0%) Exposed Structure Necrotic Amount: Large (67-100%) Fascia Exposed: No Necrotic Quality: Eschar Fat Layer (Subcutaneous Tissue) Exposed: No Tendon Exposed: No Muscle Exposed: No Joint Exposed: No Bone Exposed: No Electronic Signature(s) Signed: 09/04/2020 5:27:26 PM By: Antonieta Iba Signed: 09/06/2020 12:35:09 PM By: Zandra Abts RN, BSN Entered By: Zandra Abts on 09/04/2020 15:35:49 -------------------------------------------------------------------------------- Vitals Details Patient Name: Date of Service: Lorraine RNEY, Janaki 09/04/2020 3:00 PM Medical Record Number: [id number] Patient Account Number: [id number] Date of Birth/Sex: Treating RN: 02-Nov-1965 (55 y.o. [name] Primary Care Guenther Dunshee: [place], Betty Other Clinician: Referring Suraj Ramdass: Treating Blaike Vickers/Extender: Stone III, Hoyt [place], Betty Weeks in Treatment: 7 Vital Signs Time Taken: 15:21 Temperature (F):  98.3 Height (in): 67 Pulse (bpm): 89 Weight (lbs): 270 Respiratory Rate (breaths/min): 18 Body Mass Index (BMI): 42.3 Blood Pressure (mmHg): 145/85 Reference Range: 80 - 120 mg / dl Electronic Signature(s) Signed: 09/06/2020 12:35:09 PM By: Zandra Abts RN, BSN Entered By: Zandra Abts on 09/04/2020 15:22:56

## 2020-09-09 ENCOUNTER — Encounter (HOSPITAL_BASED_OUTPATIENT_CLINIC_OR_DEPARTMENT_OTHER): Payer: 59 | Admitting: Physician Assistant

## 2020-09-09 ENCOUNTER — Other Ambulatory Visit: Payer: Self-pay

## 2020-09-09 DIAGNOSIS — Z09 Encounter for follow-up examination after completed treatment for conditions other than malignant neoplasm: Secondary | ICD-10-CM | POA: Diagnosis not present

## 2020-09-10 NOTE — Progress Notes (Signed)
Lorraine, Hull ([id number]) Visit Report for 09/09/2020 Chief Complaint Document Details Patient Name: Date of Service: Lorraine, Hull 09/09/2020 3:30 PM Medical Record Number: [id number] Patient Account Number: [id number] Date of Birth/Sex: Treating RN: 1965-11-16 (55 y.o. [name] Primary Care Provider: [place], Hull Other Clinician: Referring Provider: Treating Provider/Extender: [name] [name] Weeks in Treatment: 8 Information Obtained from: Patient Chief Complaint 07/12/2020; patient arrives in clinic for review of a left lateral ankle surgical wound dehiscence Electronic Signature(s) Signed: 09/09/2020 4:45:56 PM By: Lenda Kelp PA-C Entered By: Lenda Kelp on 09/09/2020 16:45:56 -------------------------------------------------------------------------------- HPI Details Patient Name: Date of Service: [name], [name] 09/09/2020 3:30 PM Medical Record Number: [id number] Patient Account Number: [id number] Date of Birth/Sex: Treating RN: 10/05/1965 (55 y.o. [name] Primary Care Provider: [place], Hull Other Clinician: Referring Provider: Treating Provider/Extender: [name] [name] Weeks in Treatment: 8 History of Present Illness HPI Description: ADMISSION 07/12/2020; This is a otherwise well 56 year old woman who suffered a fall on the ice in January. She developed a left ankle fibular fracture requiring an ORIF on February 15, 2020. She is followed by Dr. Margarita Hull and she has been using wet-to-dry dressings for a long period now however the wound is only slowly contracting and she was referred for our review of this. 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 She has some edema in her lower extremities and she uses some form of compression stocking. Past medical history includes hypothyroidism, irritable bowel syndrome, anemia. She may have some degree of chronic venous  insufficiency necessitating use of compression stockings. She is not a diabetic ABI in the left leg in our clinic was noncompressible however her peripheral pulses are easily palpable 6/17; Wound is healthier looking but still non viable surface under illumination. using iodoflex 6/24; patient's wound is smaller. Still some debris on the surface. Using Iodoflex. I hope to transition today to silver collagen but I think that may be overly ambitious 6/29; wound is about the same size as last visit however the surface of the wound looks a lot better. We have been using Iodoflex. Actually things look is good as I was hoping for today and I think she might be ready to actually go ahead with Apligraf. We talked about this. She follows up with her surgeon tomorrow 7/6; wound is about the same size we have been using Iodoflex. We applied Apligraf #1 today. I think this is her best chance to get this to close 7/20; Apligraf #2. Wound is smaller 09/04/2020 upon evaluation today patient appears to be doing excellent in regard to her wound. In fact I do not see any need that I think skin to be necessary today for applying Apligraf as I think she seems to be completely healed. Fortunately there is no signs of active infection. No fevers, chills, nausea, vomiting, or diarrhea. 09/09/2020 upon evaluation today patient actually appears to be doing excellent in regard to her leg. In fact this is still completely closed and has done well over the past week. I think for discharge at this point. Electronic Signature(s) Signed: 09/09/2020 5:27:09 PM By: Lenda Kelp PA-C Entered By: Lenda Kelp on 09/09/2020 17:27:09 -------------------------------------------------------------------------------- Physical Exam Details Patient Name: Date of Service: [name], [name] 09/09/2020 3:30 PM Medical Record Number: [id number] Patient Account Number: [id number] Date of Birth/Sex: Treating RN: January 30, 1966 (54 y.o. [name] Primary Care Provider: [place], Hull Other  Clinician: Referring Provider: Treating Provider/Extender: [name] [name] Weeks in Treatment: 8 Constitutional Well-nourished and well-hydrated in no acute distress. Respiratory normal breathing without difficulty. Psychiatric this patient is able to make decisions and demonstrates good insight into disease process. Alert and Oriented x 3. pleasant and cooperative. Notes Upon inspection patient's wound showed signs of complete epithelization and I am extremely pleased with where we stand today. There does not appear to be any evidence at all of active infection which is great news. No fevers, chills, nausea, vomiting, or diarrhea. Electronic Signature(s) Signed: 09/09/2020 5:27:30 PM By: Lenda Kelp PA-C Entered By: Lenda Kelp on 09/09/2020 17:27:29 -------------------------------------------------------------------------------- Physician Orders Details Patient Name: Date of Service: [name], [name] 09/09/2020 3:30 PM Medical Record Number: [id number] Patient Account Number: [id number] Date of Birth/Sex: Treating RN: 1965-10-12 (55 y.o. [name] Primary Care Provider: [place], Hull Other Clinician: Referring Provider: Treating Provider/Extender: [name] [name] Weeks in Treatment: 8 Verbal / Phone Orders: No Diagnosis Coding ICD-10 Coding Code Description T81.31XD Disruption of external operation (surgical) wound, not elsewhere classified, subsequent encounter I87.312 Chronic venous hypertension (idiopathic) with ulcer of left lower extremity L97.828 Non-pressure chronic ulcer of other part of left lower leg with other specified severity Discharge From [facility] Discharge from Wound Care Center - No further follow up needed, call if any issues arise. Edema Control - Lymphedema / SCD / Other Avoid standing for long periods of time. Patient to wear own compression stockings  every day. - Apply in morning and remove at bedtime. Apply lotion at bedtime after stockings removed. Exercise regularly Compression stocking or Garment 20-30 mm/Hg pressure to: - pt to get knee high stockings Additional Orders / Instructions Follow Nutritious Diet - Increase protein Electronic Signature(s) Signed: 09/09/2020 5:53:47 PM By: Antonieta Iba Signed: 09/09/2020 6:00:42 PM By: Lenda Kelp PA-C Previous Signature: 09/09/2020 4:57:06 PM Version By: Antonieta Iba Entered By: Antonieta Iba on 09/09/2020 17:01:47 -------------------------------------------------------------------------------- Problem List Details Patient Name: Date of Service: [name], [name] 09/09/2020 3:30 PM Medical Record Number: [id number] Patient Account Number: [id number] Date of Birth/Sex: Treating RN: 26-Oct-1965 (55 y.o. [name] Primary Care Provider: [place], Hull Other Clinician: Referring Provider: Treating Provider/Extender: Lorraine III, Mehgan Santmyer [name] Weeks in Treatment: 8 Active Problems ICD-10 Encounter Code Description Active Date MDM Diagnosis T81.31XD Disruption of external operation (surgical) wound, not elsewhere classified, 07/12/2020 No Yes subsequent encounter I87.312 Chronic venous hypertension (idiopathic) with ulcer of left lower extremity 07/12/2020 No Yes L97.828 Non-pressure chronic ulcer of other part of left lower leg with other specified 07/12/2020 No Yes severity Inactive Problems Resolved Problems Electronic Signature(s) Signed: 09/09/2020 4:45:51 PM By: Lenda Kelp PA-C Entered By: Lenda Kelp on 09/09/2020 16:45:51 -------------------------------------------------------------------------------- Progress Note Details Patient Name: Date of Service: [name], [name] 09/09/2020 3:30 PM Medical Record Number: [id number] Patient Account Number: [id number] Date of Birth/Sex: Treating RN: 1965-03-12 (55 y.o. [name] Primary Care Provider:  [place], Hull Other Clinician: Referring Provider: Treating Provider/Extender: Lorraine III, Maveryk Renstrom [name] Weeks in Treatment: 8 Subjective Chief Complaint Information obtained from Patient 07/12/2020; patient arrives in clinic for review of a left lateral ankle surgical wound dehiscence History of Present Illness (HPI) ADMISSION 07/12/2020; This is a otherwise well 55 year old woman who suffered a fall on the ice in January. She developed a left ankle fibular fracture requiring an ORIF on February 15, 2020. She is followed by Dr. Margarita Hull and she has been using wet-to-dry dressings for a  long period now however the wound is only slowly contracting and she was referred for our review of this. 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 She has some edema in her lower extremities and she uses some form of compression stocking. Past medical history includes hypothyroidism, irritable bowel syndrome, anemia. She may have some degree of chronic venous insufficiency necessitating use of compression stockings. She is not a diabetic ABI in the left leg in our clinic was noncompressible however her peripheral pulses are easily palpable 6/17; Wound is healthier looking but still non viable surface under illumination. using iodoflex 6/24; patient's wound is smaller. Still some debris on the surface. Using Iodoflex. I hope to transition today to silver collagen but I think that may be overly ambitious 6/29; wound is about the same size as last visit however the surface of the wound looks a lot better. We have been using Iodoflex. Actually things look is good as I was hoping for today and I think she might be ready to actually go ahead with Apligraf. We talked about this. She follows up with her surgeon tomorrow 7/6; wound is about the same size we have been using Iodoflex. We applied Apligraf #1 today. I think this is her best chance to get this to close 7/20;  Apligraf #2. Wound is smaller 09/04/2020 upon evaluation today patient appears to be doing excellent in regard to her wound. In fact I do not see any need that I think skin to be necessary today for applying Apligraf as I think she seems to be completely healed. Fortunately there is no signs of active infection. No fevers, chills, nausea, vomiting, or diarrhea. 09/09/2020 upon evaluation today patient actually appears to be doing excellent in regard to her leg. In fact this is still completely closed and has done well over the past week. I think for discharge at this point. Objective Constitutional Well-nourished and well-hydrated in no acute distress. Vitals Time Taken: 3:46 PM, Height: 67 in, Weight: 270 lbs, BMI: 42.3, Temperature: 98.7 F, Pulse: 90 bpm, Respiratory Rate: 18 breaths/min, Blood Pressure: 136/82 mmHg. Respiratory normal breathing without difficulty. Psychiatric this patient is able to make decisions and demonstrates good insight into disease process. Alert and Oriented x 3. pleasant and cooperative. General Notes: Upon inspection patient's wound showed signs of complete epithelization and I am extremely pleased with where we stand today. There does not appear to be any evidence at all of active infection which is great news. No fevers, chills, nausea, vomiting, or diarrhea. Integumentary (Hair, Skin) Wound #1 status is Open. Original cause of wound was Surgical Injury. The date acquired was: 02/15/2020. The wound has been in treatment 8 weeks. The wound is located on the Left,Lateral Ankle. The wound measures 0cm length x 0cm width x 0cm depth; 0cm^2 area and 0cm^3 volume. There is a small amount of serosanguineous drainage noted. The wound margin is distinct with the outline attached to the wound base. There is no granulation within the wound bed. There is a large (67-100%) amount of necrotic tissue within the wound bed including Eschar. Assessment Active  Problems ICD-10 Disruption of external operation (surgical) wound, not elsewhere classified, subsequent encounter Chronic venous hypertension (idiopathic) with ulcer of left lower extremity Non-pressure chronic ulcer of other part of left lower leg with other specified severity Plan Discharge From [facility] Services: Discharge from Wound Care Center - No further follow up needed, call if any issues arise. Edema  Control - Lymphedema / SCD / Other: Avoid standing for long periods of time. Patient to wear own compression stockings every day. - Apply in morning and remove at bedtime. Apply lotion at bedtime after stockings removed. Exercise regularly Compression stocking or Garment 20-30 mm/Hg pressure to: - pt to get knee high stockings Additional Orders / Instructions: Follow Nutritious Diet - Increase protein 1. I am also going to suggest that we going continue with compression she has compression socks at home and is getting new ones that she will be wearing I think that is appropriate. 2. I am also can recommend that we have the patient go ahead and continue with elevation is much as possible when she is at home to help prevent things from worsening in general. 3. I am also going to recommend that she should monitor for any signs of worsening if she notices anything that begins to act like he wants to reopen she should let me know soon as possible. We will see patient back for reevaluation in 1 week here in the clinic. If anything worsens or changes patient will contact our office for additional recommendations. Electronic Signature(s) Signed: 09/09/2020 5:28:04 PM By: Lenda KelpStone III, Eric Morganti PA-C Entered By: Lenda KelpStone III, Briseidy Spark on 09/09/2020 17:28:04 -------------------------------------------------------------------------------- SuperBill Details Patient Name: Date of Service: [name], [name] 09/09/2020 Medical Record Number: [id number] Patient Account Number: [id number] Date of Birth/Sex: Treating  RN: 10-Dec-1965 (55 y.o. Lorraine CluckF) Barnhart, Jodi Primary Care Provider: [place], Hull Other Clinician: Referring Provider: Treating Provider/Extender: Lorraine III, Luciann Gossett [place], Hull Weeks in Treatment: 8 Diagnosis Coding ICD-10 Codes Code Description T81.31XD Disruption of external operation (surgical) wound, not elsewhere classified, subsequent encounter I87.312 Chronic venous hypertension (idiopathic) with ulcer of left lower extremity L97.828 Non-pressure chronic ulcer of other part of left lower leg with other specified severity Facility Procedures CPT4 Code: [id number] Description: [id number] - WOUND CARE VISIT-LEV 2 EST PT Modifier: Quantity: 1 Physician Procedures : CPT4 Code Description Modifier [id number] - WC PHYS LEVEL 3 - EST PT ICD-10 Diagnosis Description T81.31XD Disruption of external operation (surgical) wound, not elsewhere classified, subsequent encounter I87.312 Chronic venous hypertension  (idiopathic) with ulcer of left lower extremity L97.828 Non-pressure chronic ulcer of other part of left lower leg with other specified severity Quantity: 1 Electronic Signature(s) Signed: 09/09/2020 5:28:34 PM By: Lenda KelpStone III, Saje Gallop PA-C Previous Signature: 09/09/2020 5:06:14 PM Version By: Antonieta IbaBarnhart, Jodi Entered By: Lenda KelpStone III, Aretta Stetzel on 09/09/2020 17:28:34

## 2020-09-17 NOTE — Progress Notes (Signed)
JANN, MILKOVICH ([id number]) Visit Report for 09/09/2020 Arrival Information Details Patient Name: Date of Service: [name], [name] 09/09/2020 3:30 PM Medical Record Number: [id number] Patient Account Number: [id number] Date of Birth/Sex: Treating RN: 1965/05/14 (55 y.o. [name] Primary Care Cristal Qadir: [place], Betty Other Clinician: Referring Kazuto Sevey: Treating Rosealyn Little/Extender: Stone III, Hoyt [place], Betty Weeks in Treatment: 8 Visit Information History Since Last Visit Added or deleted any medications: No Patient Arrived: Ambulatory Any new allergies or adverse reactions: No Arrival Time: 15:45 Had a fall or experienced change in No Accompanied By: self activities of daily living that may affect Transfer Assistance: None risk of falls: Patient Identification Verified: Yes Signs or symptoms of abuse/neglect since last visito No Secondary Verification Process Completed: Yes Hospitalized since last visit: No Patient Requires Transmission-Based Precautions: No Implantable device outside of the clinic excluding No Patient Has Alerts: No cellular tissue based products placed in the center since last visit: Has Dressing in Place as Prescribed: Yes Pain Present Now: No Electronic Signature(s) Signed: 09/17/2020 11:33:27 AM By: Karl Ito Entered By: Karl Ito on 09/09/2020 15:46:13 -------------------------------------------------------------------------------- Clinic Level of Care Assessment Details Patient Name: Date of Service: Lorraine RENDI, MAPEL 09/09/2020 3:30 PM Medical Record Number: [id number] Patient Account Number: [id number] Date of Birth/Sex: Treating RN: 1965-05-13 (55 y.o. [name] Primary Care Nazaiah Navarrete: [place], Betty Other Clinician: Referring Tam Savoia: Treating Areya Lemmerman/Extender: Stone III, Hoyt [place], Betty Weeks in Treatment: 8 Clinic Level of Care Assessment Items TOOL 4 Quantity Score X- 1 0 Use when only an EandM is  performed on FOLLOW-UP visit ASSESSMENTS - Nursing Assessment / Reassessment X- 1 10 Reassessment of Lorraine-morbidities (includes updates in patient status) X- 1 5 Reassessment of Adherence to Treatment Plan ASSESSMENTS - Wound and Skin A ssessment / Reassessment X - Simple Wound Assessment / Reassessment - one wound 1 5 []  - 0 Complex Wound Assessment / Reassessment - multiple wounds []  - 0 Dermatologic / Skin Assessment (not related to wound area) ASSESSMENTS - Focused Assessment []  - 0 Circumferential Edema Measurements - multi extremities []  - 0 Nutritional Assessment / Counseling / Intervention []  - 0 Lower Extremity Assessment (monofilament, tuning fork, pulses) []  - 0 Peripheral Arterial Disease Assessment (using hand held doppler) ASSESSMENTS - Ostomy and/or Continence Assessment and Care []  - 0 Incontinence Assessment and Management []  - 0 Ostomy Care Assessment and Management (repouching, etc.) PROCESS - Coordination of Care []  - 0 Simple Patient / Family Education for ongoing care X- 1 20 Complex (extensive) Patient / Family Education for ongoing care []  - 0 Staff obtains , Records, T Results / Process Orders est []  - 0 Staff telephones HHA, Nursing Homes / Clarify orders / etc []  - 0 Routine Transfer to another Facility (non-emergent condition) []  - 0 Routine Hospital Admission (non-emergent condition) []  - 0 New Admissions / / Ordering NPWT Apligraf, etc. , []  - 0 Emergency Hospital Admission (emergent condition) X- 1 10 Simple Discharge Coordination []  - 0 Complex (extensive) Discharge Coordination PROCESS - Special Needs []  - 0 Pediatric / Minor Patient Management []  - 0 Isolation Patient Management []  - 0 Hearing / Language / Visual special needs []  - 0 Assessment of Community assistance (transportation, D/C planning, etc.) []  - 0 Additional assistance / Altered mentation []  - 0 Support Surface(s) Assessment  (bed, cushion, seat, etc.) INTERVENTIONS - Wound Cleansing / Measurement []  - 0 Simple Wound Cleansing - one wound []  - 0 Complex Wound Cleansing - multiple wounds X- 1 5 Wound Imaging (  photographs - any number of wounds) []  - 0 Wound Tracing (instead of photographs) []  - 0 Simple Wound Measurement - one wound []  - 0 Complex Wound Measurement - multiple wounds INTERVENTIONS - Wound Dressings []  - 0 Small Wound Dressing one or multiple wounds []  - 0 Medium Wound Dressing one or multiple wounds []  - 0 Large Wound Dressing one or multiple wounds []  - 0 Application of Medications - topical []  - 0 Application of Medications - injection INTERVENTIONS - Miscellaneous []  - 0 External ear exam []  - 0 Specimen Collection (cultures, biopsies, blood, body fluids, etc.) []  - 0 Specimen(s) / Culture(s) sent or taken to Lab for analysis []  - 0 Patient Transfer (multiple staff / / Similar devices) []  - 0 Simple Staple / Suture removal (25 or less) []  - 0 Complex Staple / Suture removal (26 or more) []  - 0 Hypo / Hyperglycemic Management (close monitor of Blood Glucose) []  - 0 Ankle / Brachial Index (ABI) - do not check if billed separately X- 1 5 Vital Signs Has the patient been seen at the hospital within the last three years: Yes Total Score: 60 Level Of Care: New/Established - Level 2 Electronic Signature(s) Signed: 09/09/2020 5:53:47 PM By: Entered By: on 09/09/2020 17:06:06 -------------------------------------------------------------------------------- Encounter Discharge Information Details Patient Name: Date of Service: [name], [name] 09/09/2020 3:30 PM Medical Record Number: Patient Account Number: Date of Birth/Sex: Treating RN: 1965-10-21 (55 y.o. Primary Care Janeane Cozart: , Betty Other Clinician: Referring Triana Coover: Treating Araeya Lamb/Extender: Stone III, Hoyt Nurse, adult, Betty Weeks  in Treatment: 8 Encounter Discharge Information Items Discharge Condition: Stable Ambulatory Status: Ambulatory Discharge Destination: Home Transportation: Private Auto Schedule Follow-up Appointment: No Clinical Summary of Care: Provided on 09/09/2020 Form Type Recipient Paper Patient Patient Electronic Signature(s) Signed: 09/09/2020 5:06:53 PM By: Entered By: on 09/09/2020 17:06:53 -------------------------------------------------------------------------------- Lower Extremity Assessment Details Patient Name: Date of Service: [name], [name] 09/09/2020 3:30 PM Medical Record Number: Antonieta Iba Patient Account Number: 11/09/2020 Date of Birth/Sex: Treating RN: 1965/08/07 (55 y.o. [id number] Primary Care Ayaan Shutes: 11/22/1965, Betty Other Clinician: Referring Shonita Rinck: Treating Achille Xiang/Extender: Stone III, Hoyt 40, Betty Weeks in Treatment: 8 Edema Assessment Assessed: [Left: Yes] [Right: No] Edema: [Left: Ye] [Right: s] Calf Left: Right: Point of Measurement: 36 cm From Medial Instep 37 cm Ankle Left: Right: Point of Measurement: 11 cm From Medial Instep 22 cm Vascular Assessment Pulses: Dorsalis Pedis Palpable: [Left:Yes] Electronic Signature(s) Signed: 09/09/2020 5:35:12 PM By: [place] Entered By: [place] on 09/09/2020 16:06:35 -------------------------------------------------------------------------------- Multi-Disciplinary Care Plan Details Patient Name: Date of Service: [name], [name] 09/09/2020 3:30 PM Medical Record Number: Antonieta Iba Patient Account Number: Antonieta Iba Date of Birth/Sex: Treating RN: Sep 11, 1965 (55 y.o. [id number] Primary Care Elijan Googe: [id number], Betty Other Clinician: Referring Paulo Keimig: Treating Zianna Dercole/Extender: Stone III, Hoyt 11/22/1965, Betty Weeks in Treatment: 8 Multidisciplinary Care Plan reviewed with physician Active Inactive Electronic Signature(s) Signed: 09/09/2020 5:53:47 PM  By: Arta Silence Entered By: [place] on 09/09/2020 16:59:35 -------------------------------------------------------------------------------- Pain Assessment Details Patient Name: Date of Service: [name], [name] 09/09/2020 3:30 PM Medical Record Number: Shawn Stall Patient Account Number: Shawn Stall Date of Birth/Sex: Treating RN: 1965/12/06 (55 y.o. [id number] Primary Care Adyen Bifulco: [id number], Betty Other Clinician: Referring Seville Downs: Treating Aerabella Galasso/Extender: Stone III, Hoyt 11/22/1965, Betty Weeks in Treatment: 8 Active Problems Location of Pain Severity and Description of Pain Patient Has Paino No Site Locations Pain Management and Medication Current Pain Management:  Electronic Signature(s) Signed: 09/09/2020 5:53:47 PM By: Antonieta Iba Signed: 09/17/2020 11:33:27 AM By: Karl Ito Entered By: Karl Ito on 09/09/2020 15:46:45 -------------------------------------------------------------------------------- Patient/Caregiver Education Details Patient Name: Date of Service: [name], [name] 8/8/2022andnbsp3:30 PM Medical Record Number: [id number] Patient Account Number: [id number] Date of Birth/Gender: Treating RN: [birth year] (55 y.o. [name] Primary Care Physician: [place], Betty Other Clinician: Referring Physician: Treating Physician/Extender: Stone III, Hoyt [place], Betty Weeks in Treatment: 8 Education Assessment Education Provided To: Patient Education Topics Provided Notes Healed, discharge instructions given Electronic Signature(s) Signed: 09/09/2020 5:53:47 PM By: Antonieta Iba Entered By: Antonieta Iba on 09/09/2020 16:59:58 -------------------------------------------------------------------------------- Wound Assessment Details Patient Name: Date of Service: [name], [name] 09/09/2020 3:30 PM Medical Record Number: [id number] Patient Account Number: [id number] Date of Birth/Sex: Treating RN: 1965/07/09 (55 y.o. [name] Primary Care Cornellius Kropp: [place], Betty Other Clinician: Referring Patrici Minnis: Treating Kasyn Stouffer/Extender: Stone III, Hoyt [place], Betty Weeks in Treatment: 8 Wound Status Wound Number: 1 Primary Etiology: Open Surgical Wound Wound Location: Left, Lateral Ankle Wound Status: Open Wounding Event: Surgical Injury Comorbid History: Anemia, Asthma Date Acquired: 02/15/2020 Weeks Of Treatment: 8 Clustered Wound: No Photos Wound Measurements Length: (cm) Width: (cm) Depth: (cm) Area: (cm) Volume: (cm) 0 % Reduction in Area: 100% 0 % Reduction in Volume: 100% 0 Epithelialization: Large (67-100%) 0 0 Wound Description Classification: Full Thickness Without Exposed Support Structu Wound Margin: Distinct, outline attached Exudate Amount: Small Exudate Type: Serosanguineous Exudate Color: red, brown res Foul Odor After Cleansing: No Slough/Fibrino No Wound Bed Granulation Amount: None Present (0%) Exposed Structure Necrotic Amount: Large (67-100%) Fascia Exposed: No Necrotic Quality: Eschar Fat Layer (Subcutaneous Tissue) Exposed: No Tendon Exposed: No Muscle Exposed: No Joint Exposed: No Bone Exposed: No Electronic Signature(s) Signed: 09/09/2020 5:53:47 PM By: Antonieta Iba Signed: 09/17/2020 11:33:27 AM By: Karl Ito Entered By: Karl Ito on 09/09/2020 15:50:29 -------------------------------------------------------------------------------- Vitals Details Patient Name: Date of Service: [name], [name] 09/09/2020 3:30 PM Medical Record Number: [id number] Patient Account Number: [id number] Date of Birth/Sex: Treating RN: May 15, 1965 (55 y.o. [name] Primary Care Aveya Beal: [place], Betty Other Clinician: Referring Emelia Sandoval: Treating Kymoni Lesperance/Extender: Stone III, Hoyt [place], Betty Weeks in Treatment: 8 Vital Signs Time Taken: 15:46 Temperature (F): 98.7 Height (in): 67 Pulse (bpm): 90 Weight (lbs): 270 Respiratory Rate  (breaths/min): 18 Body Mass Index (BMI): 42.3 Blood Pressure (mmHg): 136/82 Reference Range: 80 - 120 mg / dl Electronic Signature(s) Signed: 09/17/2020 11:33:27 AM By: Karl Ito Entered By: Karl Ito on 09/09/2020 15:46:31

## 2020-09-30 ENCOUNTER — Ambulatory Visit
Admission: RE | Admit: 2020-09-30 | Discharge: 2020-09-30 | Disposition: A | Discharge status: Home / Self Care | Payer: 59 | Source: Ambulatory Visit | Attending: Family Medicine | Admitting: Family Medicine

## 2020-09-30 ENCOUNTER — Other Ambulatory Visit: Payer: Self-pay

## 2020-09-30 DIAGNOSIS — Z1231 Encounter for screening mammogram for malignant neoplasm of breast: Secondary | ICD-10-CM

## 2020-10-15 NOTE — Progress Notes (Deleted)
55 y.o. G0P0000 Single Black or African American Not Hispanic or Latino female here for annual exam.      No LMP recorded. (Menstrual status: Perimenopausal).          Sexually active: {yes no:314532}  The current method of family planning is {contraception:315051}.    Exercising: {yes no:314532}  {types:19826} Smoker:  {YES [id number]  Health Maintenance: Pap:  07/14/18 WNL HR HPV Neg   02/25/16 WNL HR HPV Neg  History of abnormal Pap:  no MMG:  10/04/20 Bi-rads 1 neg  BMD:   none  Colonoscopy: 02/23/17 f/u/5 years  TDaP:  10/30/19  Gardasil: n/a    reports that she has never smoked. She has never used smokeless tobacco. She reports that she does not drink alcohol and does not use drugs.  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    Lower extremities   Anemia    past hx    Asthma    childhood   Hypothyroidism    IBS (irritable bowel syndrome)    Multinodular goiter    s/p radioactive I131 ablation, Iatogenic Hypothyroidism   Obesity    Personal history of goiter    with radioactive iodine ablation    Past Surgical History:  Procedure Laterality Date   BREAST CYST EXCISION Right 2012   BREAST SURGERY     biopsy, right   WISDOM TOOTH EXTRACTION      Current Outpatient Medications  Medication Sig Dispense Refill   acetaminophen (TYLENOL) 500 MG tablet Take 500 mg by mouth every 6 (six) hours as needed for mild pain, fever or headache.     HYDROcodone-acetaminophen (NORCO/VICODIN) 5-325 MG tablet Take 1-2 tablets by mouth every 6 (six) hours as needed for severe pain. 10 tablet 0   ibuprofen (ADVIL) 200 MG tablet Take 200 mg by mouth every 6 (six) hours as needed for fever, headache or mild pain.     levothyroxine (SYNTHROID) 137 MCG tablet TAKE 1 TABLET BY MOUTH TWICE DAILY ON WEDNESDAY, THURSDAY AND FRIDAY, THEN ONCE DAILY ON MONDAY, THURSDAY, SATURDAY AND SUNDAY 120 tablet 2   naproxen sodium (ALEVE) 220 MG tablet  Take 220 mg by mouth as needed (pain/headache).     VITAMIN D PO Take 1 capsule by mouth daily.     VITAMIN E PO Take 1 tablet by mouth daily.     No current facility-administered medications for this visit.    Family History  Problem Relation Age of Onset   Sarcoidosis Brother    Colon cancer Brother    Kidney failure Father    Prostate cancer Father    Hypertension Mother    Diabetes Mother    Heart disease Mother    Heart disease Sister    Pancreatic cancer Brother     Review of Systems  Exam:   There were no vitals taken for this visit.  Weight change: @WEIGHTCHANGE @ Height:      Ht Readings from Last 3 Encounters:  10/30/19 5\' 6"  (1.676 m)  10/16/19 5\' 6"  (1.676 m)  10/03/18 5\' 7"  (1.702 m)    General appearance: alert, cooperative and appears stated age Head: Normocephalic, without obvious abnormality, atraumatic Neck: no adenopathy, supple, symmetrical, trachea midline and thyroid {CHL AMB PHY EX THYROID NORM DEFAULT:[phone]::"normal to inspection and palpation"} Lungs: clear to auscultation bilaterally Cardiovascular: regular rate and rhythm Breasts: {Exam; breast:13139::"normal appearance, no masses or tenderness"} Abdomen: soft, non-tender; non distended,  no masses,  no organomegaly Extremities: extremities normal, atraumatic, no cyanosis or edema Skin: Skin color, texture, turgor normal. No rashes or lesions Lymph nodes: Cervical, supraclavicular, and axillary nodes normal. No abnormal inguinal nodes palpated Neurologic: Grossly normal   Pelvic: External genitalia:  no lesions              Urethra:  normal appearing urethra with no masses, tenderness or lesions              Bartholins and Skenes: normal                 Vagina: normal appearing vagina with normal color and discharge, no lesions              Cervix: {CHL AMB PHY EX CERVIX NORM DEFAULT:[phone]::"no lesions"}               Bimanual Exam:  Uterus:  {CHL AMB PHY EX UTERUS NORM  DEFAULT:[phone]::"normal size, contour, position, consistency, mobility, non-tender"}              Adnexa: {CHL AMB PHY EX ADNEXA NO MASS DEFAULT:[phone]::"no mass, fullness, tenderness"}               Rectovaginal: Confirms               Anus:  normal sphincter tone, no lesions  *** chaperoned for the exam.  A:  Well Woman with normal exam  P:

## 2020-10-17 ENCOUNTER — Ambulatory Visit: Payer: 59 | Admitting: Obstetrics and Gynecology

## 2020-10-23 ENCOUNTER — Telehealth: Payer: Self-pay | Admitting: Family Medicine

## 2020-10-23 NOTE — Telephone Encounter (Signed)
Needs appointment, can use one of the virtual slots for in person.

## 2020-10-23 NOTE — Telephone Encounter (Signed)
LVM to let patient know she needs to make an appointment to be seen for her request.

## 2020-10-23 NOTE — Telephone Encounter (Signed)
Pt called triage line stating that she needs a new script for her back pain.  Her script has expired.  Her foot is also hurting from a fracture back in January.

## 2020-10-24 ENCOUNTER — Telehealth: Payer: Self-pay | Admitting: Family Medicine

## 2020-10-24 NOTE — Telephone Encounter (Signed)
Called patient to get her rescheduled for her virtual appointment due to provider being out of office. Rescheduled patient for 9/27.   Patient says that she is completely out of meloxicam and is needing a refill to be sent to [id number] - [place], [place] - [address] AT [facility] OF [address]  Please advise.

## 2020-10-25 ENCOUNTER — Telehealth: Payer: 59 | Admitting: Family Medicine

## 2020-10-25 ENCOUNTER — Other Ambulatory Visit: Payer: Self-pay | Admitting: Family Medicine

## 2020-10-25 MED ORDER — MELOXICAM 15 MG PO TABS: 15.0000 mg | ORAL_TABLET | Freq: Every day | ORAL | 0 refills | Status: DC

## 2020-10-25 NOTE — Telephone Encounter (Signed)
Rx sent in as pcp has filled this before.

## 2020-10-29 ENCOUNTER — Encounter: Payer: Self-pay | Admitting: Family Medicine

## 2020-10-29 ENCOUNTER — Telehealth (INDEPENDENT_AMBULATORY_CARE_PROVIDER_SITE_OTHER): Payer: 59 | Admitting: Family Medicine

## 2020-10-29 VITALS — Ht 66.0 in

## 2020-10-29 DIAGNOSIS — E785 Hyperlipidemia, unspecified: Secondary | ICD-10-CM

## 2020-10-29 DIAGNOSIS — E032 Hypothyroidism due to medicaments and other exogenous substances: Secondary | ICD-10-CM

## 2020-10-29 DIAGNOSIS — M25572 Pain in left ankle and joints of left foot: Secondary | ICD-10-CM | POA: Diagnosis not present

## 2020-10-29 MED ORDER — MELOXICAM 15 MG PO TABS: 15.0000 mg | ORAL_TABLET | Freq: Every day | ORAL | 3 refills | Status: DC

## 2020-10-29 NOTE — Progress Notes (Signed)
Virtual Visit via Video Note I connected with [name] on 10/29/20 by a video enabled telemedicine application and verified that I am speaking with the correct person using two identifiers.  Location patient: home Location provider:Home office Persons participating in the virtual visit: patient, provider  I discussed the limitations of evaluation and management by telemedicine and the availability of in person appointments. The patient expressed understanding and agreed to proceed.  Chief Complaint  Patient presents with   medication follow up    Meloxicam, pt still having pain in her foot.   HPI: [name] is a 55 yo female with hx of peripheral neuropathy,hypothyroidism,and BMI 45 requesting refills on Meloxicam 15 mg. Left distal fibular fracture in 02/06/2020, s/p ORIF 02/15/20. She is having medial ankle soreness,mild,and constant. Pain has been worse for the past week. She is taking Meloxicam daily prn, not daily.  Pain is exacerbated by prolonged walking and standing, alleviated by rest. Mild edema,which improved with compression stockings. No erythema.  Hypothyroidism:She is on Levothyroxine 137 mcg bid W-Th-Fr and 1 tab rest of the week. Negative for abnormal wt loss,palpitations,changes in bowel habits,tremors, cold/heat intolerance.  Lab Results  Component Value Date   TSH 5.670 (H) 10/30/2019   Hyperlipidemia: Currently on non pharmacologic treatment. Following a low fat diet:Yes.  Lab Results  Component Value Date   CHOL 206 (H) 10/30/2019   HDL 57 10/30/2019   LDLCALC 139 (H) 10/30/2019   TRIG 57 10/30/2019   CHOLHDL 3.6 10/30/2019   ROS: See pertinent positives and negatives per HPI.  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    Lower extremities   Anemia    past hx    Asthma    childhood   Hypothyroidism    IBS (irritable bowel syndrome)    Multinodular goiter    s/p radioactive I131  ablation, Iatogenic Hypothyroidism   Obesity    Personal history of goiter    with radioactive iodine ablation    Past Surgical History:  Procedure Laterality Date   BREAST CYST EXCISION Right 2012   BREAST SURGERY     biopsy, right   WISDOM TOOTH EXTRACTION      Family History  Problem Relation Age of Onset   Sarcoidosis Brother    Colon cancer Brother    Kidney failure Father    Prostate cancer Father    Hypertension Mother    Diabetes Mother    Heart disease Mother    Heart disease Sister    Pancreatic cancer Brother     Social History   Socioeconomic History   Marital status: Single    Spouse name: Not on file   Number of children: 0   Years of education: 16   Highest education level: Not on file  Occupational History   Occupation: CLINICAL CHEMIST    Employer: [employer]  Tobacco Use   Smoking status: Never   Smokeless tobacco: Never  Vaping Use   Vaping Use: Never used  Substance and Sexual Activity   Alcohol use: No   Drug use: No   Sexual activity: Not Currently    Birth control/protection: None  Other Topics Concern   Not on file  Social History Narrative   Not on file   Social Determinants of Health   Financial Resource Strain: Not on file  Food Insecurity: Not on file  Transportation Needs: Not on file  Physical Activity: Not on file  Stress: Not  on file  Social Connections: Not on file  Intimate Partner Violence: Not on file    Current Outpatient Medications:    acetaminophen (TYLENOL) 500 MG tablet, Take 500 mg by mouth every 6 (six) hours as needed for mild pain, fever or headache., Disp: , Rfl:    levothyroxine (SYNTHROID) 137 MCG tablet, TAKE 1 TABLET BY MOUTH TWICE DAILY ON WEDNESDAY, THURSDAY AND FRIDAY, THEN ONCE DAILY ON MONDAY, THURSDAY, SATURDAY AND SUNDAY, Disp: 120 tablet, Rfl: 2   VITAMIN D PO, Take 1 capsule by mouth daily., Disp: , Rfl:    VITAMIN E PO, Take 1 tablet by mouth daily., Disp: , Rfl:    meloxicam (MOBIC) 15 MG  tablet, Take 1 tablet (15 mg total) by mouth daily., Disp: 30 tablet, Rfl: 3  EXAM:  VITALS per patient if applicable:Ht 5\' 6"  (1.676 m)   BMI 45.06 kg/m   GENERAL: alert, oriented, appears well and in no acute distress  HEENT: atraumatic, conjunctiva clear, no obvious abnormalities on inspection.  NECK: normal movements of the head and neck  LUNGS: on inspection no signs of respiratory distress, breathing rate appears normal, no obvious gross SOB, gasping or wheezing  CV: no obvious cyanosis  MS: moves all visible extremities without noticeable abnormality  PSYCH/NEURO: pleasant and cooperative, no obvious depression or anxiety, speech and thought processing grossly intact  ASSESSMENT AND PLAN:  Discussed the following assessment and plan: Orders Placed This Encounter  Procedures   Basic metabolic panel   Lipid panel   T3, free   T4, free   TSH    Left ankle pain, unspecified chronicity - Plan: meloxicam (MOBIC) 15 MG tablet Continue Meloxicam 15 mg daily prn. We discussed side effects of NSAID's, including CV complications. Wt loss will help as well.  Iatrogenic hypothyroidism  Last TSH mildly abnormal. No changes in Levothyroxine dose, will adjust treatment according to TSH result.  Hyperlipidemia, unspecified hyperlipidemia type - Plan: Lipid panel Non pharmacologic treatment recommended for now. Further recommendations will be given according to 10 years CVD risk score and lipid panel numbers.  I discussed the assessment and treatment plan with the patient. The patient was provided an opportunity to ask questions and all were answered. The patient agreed with the plan and demonstrated an understanding of the instructions.  Return in about 3 months (around 01/28/2021) for cpe.    [name] 01/30/2021, MD

## 2020-10-30 ENCOUNTER — Other Ambulatory Visit: Payer: Self-pay | Admitting: Family Medicine

## 2020-10-31 LAB — BASIC METABOLIC PANEL
BUN/Creatinine Ratio: 14 (ref 9–23)
BUN: 10 mg/dL (ref 6–24)
CO2: 24 mmol/L (ref 20–29)
Calcium: 9.1 mg/dL (ref 8.7–10.2)
Chloride: 102 mmol/L (ref 96–106)
Creatinine, Ser: 0.71 mg/dL (ref 0.57–1.00)
Glucose: 81 mg/dL (ref 70–99)
Potassium: 3.8 mmol/L (ref 3.5–5.2)
Sodium: 140 mmol/L (ref 134–144)
eGFR: 102 mL/min/{1.73_m2} (ref >59)

## 2020-10-31 LAB — LIPID PANEL
Chol/HDL Ratio: 3.5 ratio (ref 0.0–4.4)
Cholesterol, Total: 195 mg/dL (ref 100–199)
HDL: 56 mg/dL (ref >39)
LDL Chol Calc (NIH): 131 mg/dL — ABNORMAL HIGH (ref 0–99)
Triglycerides: 43 mg/dL (ref 0–149)
VLDL Cholesterol Cal: 8 mg/dL (ref 5–40)

## 2020-10-31 LAB — T4, FREE: Free T4: 1.69 ng/dL (ref 0.82–1.77)

## 2020-10-31 LAB — T3, FREE: T3, Free: 2.3 pg/mL (ref 2.0–4.4)

## 2020-10-31 LAB — TSH: TSH: 1.88 u[IU]/mL (ref 0.450–4.500)

## 2020-11-22 ENCOUNTER — Other Ambulatory Visit: Payer: Self-pay | Admitting: Family Medicine

## 2020-11-22 DIAGNOSIS — M25572 Pain in left ankle and joints of left foot: Secondary | ICD-10-CM

## 2021-01-23 NOTE — Progress Notes (Deleted)
55 y.o. G0P0000 Single Black or African American Not Hispanic or Latino female here for annual exam.      No LMP recorded. (Menstrual status: Perimenopausal).          Sexually active: {yes no:314532}  The current method of family planning is {contraception:315051}.    Exercising: {yes no:314532}  {types:19826} Smoker:  {YES [id number]  Health Maintenance: Pap:  07/14/18 WNL HR HPV Neg   02/25/16 WNL HR HPV Neg  History of abnormal Pap:  no MMG:  09/30/20 density B Bi-rads 1 neg  BMD:   none  Colonoscopy 02/23/17 WNL, 5 year f/u  TDaP:  UTD per patient  Gardasil: na     reports that she has never smoked. She has never used smokeless tobacco. She reports that she does not drink alcohol and does not use drugs.  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    Lower extremities   Anemia    past hx    Asthma    childhood   Hypothyroidism    IBS (irritable bowel syndrome)    Multinodular goiter    s/p radioactive I131 ablation, Iatogenic Hypothyroidism   Obesity    Personal history of goiter    with radioactive iodine ablation    Past Surgical History:  Procedure Laterality Date   BREAST CYST EXCISION Right 2012   BREAST SURGERY     biopsy, right   WISDOM TOOTH EXTRACTION      Current Outpatient Medications  Medication Sig Dispense Refill   acetaminophen (TYLENOL) 500 MG tablet Take 500 mg by mouth every 6 (six) hours as needed for mild pain, fever or headache.     levothyroxine (SYNTHROID) 137 MCG tablet TAKE 1 TABLET BY MOUTH TWICE DAILY ON WEDNESDAY, THURSDAY AND FRIDAY, THEN ONCE DAILY ON MONDAY, THURSDAY, SATURDAY AND SUNDAY 120 tablet 2   meloxicam (MOBIC) 15 MG tablet TAKE 1 TABLET(15 MG) BY MOUTH DAILY 30 tablet 3   VITAMIN D PO Take 1 capsule by mouth daily.     VITAMIN E PO Take 1 tablet by mouth daily.     No current facility-administered medications for this visit.    Family History  Problem Relation Age  of Onset   Sarcoidosis Brother    Colon cancer Brother    Kidney failure Father    Prostate cancer Father    Hypertension Mother    Diabetes Mother    Heart disease Mother    Heart disease Sister    Pancreatic cancer Brother     Review of Systems  Exam:   There were no vitals taken for this visit.  Weight change: @WEIGHTCHANGE @ Height:      Ht Readings from Last 3 Encounters:  10/29/20 5\' 6"  (1.676 m)  10/30/19 5\' 6"  (1.676 m)  10/16/19 5\' 6"  (1.676 m)    General appearance: alert, cooperative and appears stated age Head: Normocephalic, without obvious abnormality, atraumatic Neck: no adenopathy, supple, symmetrical, trachea midline and thyroid {CHL AMB PHY EX THYROID NORM [phone]::"normal to inspection and palpation"} Lungs: clear to auscultation bilaterally Cardiovascular: regular rate and rhythm Breasts: {Exam; breast:13139::"normal appearance, no masses or tenderness"} Abdomen: soft, non-tender; non distended,  no masses,  no organomegaly Extremities: extremities normal, atraumatic, no cyanosis or edema Skin: Skin color, texture, turgor normal. No rashes or lesions Lymph nodes: Cervical, supraclavicular, and axillary nodes normal. No abnormal inguinal nodes palpated Neurologic: Grossly normal   Pelvic: External genitalia:  no lesions  Urethra:  normal appearing urethra with no masses, tenderness or lesions              Bartholins and Skenes: normal                 Vagina: normal appearing vagina with normal color and discharge, no lesions              Cervix: {CHL AMB PHY EX CERVIX NORM DEFAULT:[phone]::"no lesions"}               Bimanual Exam:  Uterus:  {CHL AMB PHY EX UTERUS NORM DEFAULT:[phone]::"normal size, contour, position, consistency, mobility, non-tender"}              Adnexa: {CHL AMB PHY EX ADNEXA NO MASS DEFAULT:[phone]::"no mass, fullness, tenderness"}               Rectovaginal: Confirms               Anus:  normal  sphincter tone, no lesions  *** chaperoned for the exam.  A:  Well Woman with normal exam  P:

## 2021-01-29 ENCOUNTER — Ambulatory Visit: Payer: 59 | Admitting: Obstetrics and Gynecology

## 2021-02-04 ENCOUNTER — Encounter: Payer: 59 | Admitting: Family Medicine

## 2021-02-04 NOTE — Progress Notes (Deleted)
HPI: [name] is a 56 y.o. female, who is here today for her routine physical.  Last CPE: 10/30/19.  Regular exercise 3 or more time per week: *** Following a healthy diet: *** She lives with ***  Chronic medical problems: IBS,peripheral neuropathy,hypothyroidism,HLD,iron def anemia, and generalized OA among some.   Immunization History  Administered Date(s) Administered   Influenza Split 01/20/2012   Influenza Whole 11/07/2007, 10/29/2008, 12/17/2009   Influenza,inj,Quad PF,6+ Mos 11/19/2015, 11/21/2018, 10/30/2019   PFIZER(Purple Top)SARS-COV-2 Vaccination 03/31/2019, 04/25/2019   Td 12/09/2006   Tdap 10/30/2019   Health Maintenance  Topic Date Due   HIV Screening  Never done   Zoster Vaccines- Shingrix (1 of 2) Never done   COVID-19 Vaccine (3 - Booster for Pfizer series) 06/20/2019   INFLUENZA VACCINE  09/02/2020   PAP SMEAR-Modifier  07/11/2021   COLONOSCOPY (Pts 45-2yrs Insurance coverage will need to be confirmed)  02/23/2022   MAMMOGRAM  10/01/2022   TETANUS/TDAP  10/29/2029   Hepatitis C Screening  Completed   Pneumococcal Vaccine 61-41 Years old  Aged Out   HPV VACCINES  Aged Out    She has *** concerns today.  Review of Systems  Current Outpatient Medications on File Prior to Visit  Medication Sig Dispense Refill   acetaminophen (TYLENOL) 500 MG tablet Take 500 mg by mouth every 6 (six) hours as needed for mild pain, fever or headache.     levothyroxine (SYNTHROID) 137 MCG tablet TAKE 1 TABLET BY MOUTH TWICE DAILY ON WEDNESDAY, THURSDAY AND FRIDAY, THEN ONCE DAILY ON MONDAY, THURSDAY, SATURDAY AND SUNDAY 120 tablet 2   meloxicam (MOBIC) 15 MG tablet TAKE 1 TABLET(15 MG) BY MOUTH DAILY 30 tablet 3   VITAMIN D PO Take 1 capsule by mouth daily.     VITAMIN E PO Take 1 tablet by mouth daily.     No current facility-administered medications on file prior to visit.     Past Medical History:  Diagnosis Date   Alteration in sensory perception  as evidenced by illusions    Lower extremities   Anemia    past hx    Asthma    childhood   Hypothyroidism    IBS (irritable bowel syndrome)    Multinodular goiter    s/p radioactive I131 ablation, Iatogenic Hypothyroidism   Obesity    Personal history of goiter    with radioactive iodine ablation    Past Surgical History:  Procedure Laterality Date   BREAST CYST EXCISION Right 2012   BREAST SURGERY     biopsy, right   WISDOM TOOTH EXTRACTION      Allergies  Allergen Reactions   Sulfonamide Derivatives Hives    Family History  Problem Relation Age of Onset   Sarcoidosis Brother    Colon cancer Brother    Kidney failure Father    Prostate cancer Father    Hypertension Mother    Diabetes Mother    Heart disease Mother    Heart disease Sister    Pancreatic cancer Brother     Social History   Socioeconomic History   Marital status: Single    Spouse name: Not on file   Number of children: 0   Years of education: 16   Highest education level: Not on file  Occupational History   Occupation: CLINICAL CHEMIST    Employer: [employer]  Tobacco Use   Smoking status: Never   Smokeless tobacco: Never  Vaping Use   Vaping Use: Never used  Substance and Sexual Activity   Alcohol use: No   Drug use: No   Sexual activity: Not Currently    Birth control/protection: None  Other Topics Concern   Not on file  Social History Narrative   Not on file   Social Determinants of Health   Financial Resource Strain: Not on file  Food Insecurity: Not on file  Transportation Needs: Not on file  Physical Activity: Not on file  Stress: Not on file  Social Connections: Not on file    There were no vitals filed for this visit. There is no height or weight on file to calculate BMI.  Wt Readings from Last 3 Encounters:  10/30/19 279 lb 3.2 oz (126.6 kg)  10/16/19 282 lb (127.9 kg)  10/20/18 286 lb (129.7 kg)     Physical Exam  ASSESSMENT AND PLAN:  [name] was here today annual physical examination.  No orders of the defined types were placed in this encounter.   There are no diagnoses linked to this encounter.  No problem-specific Assessment & Plan notes found for this encounter.   No follow-ups on file.  Aldwin Micalizzi G. [place], MD  [facility]. [place] office.

## 2021-03-04 NOTE — Progress Notes (Signed)
HPI: [name] is a 56 y.o. female, who is here today for her routine physical.  Last CPE: 10/30/19  Regular exercise 3 or more time per week: Not consistently due to left ankle pain and wound that has already healed. Following a healthy diet: More vegetables and fruit, cooking more at home.  Chronic medical problems: IBS,peripheral neuropathy,hypothyroidism,HLD,iron def anemia, and generalized OA among some.  Immunization History  Administered Date(s) Administered   Influenza Split 01/20/2012   Influenza Whole 11/07/2007, 10/29/2008, 12/17/2009   Influenza,inj,Quad PF,6+ Mos 11/19/2015, 11/21/2018, 10/30/2019   Influenza-Unspecified 02/02/2021   PFIZER(Purple Top)SARS-COV-2 Vaccination 03/31/2019, 04/25/2019   Td 12/09/2006   Tdap 10/30/2019   Health Maintenance  Topic Date Due   COVID-19 Vaccine (3 - Booster for [place] series) 03/21/2021 (Originally 06/20/2019)   Zoster Vaccines- Shingrix (1 of 2) 03/08/2027 (Originally 11/19/2016)   PAP SMEAR-Modifier  07/11/2021   COLONOSCOPY (Pts 45-46yrs Insurance coverage will need to be confirmed)  02/23/2022   MAMMOGRAM  10/01/2022   TETANUS/TDAP  10/29/2029   INFLUENZA VACCINE  Completed   Hepatitis C Screening  Completed   HPV VACCINES  Aged Out   HIV Screening  Discontinued   She has no new concerns today. Since her last visit she completed wound treatment for left ankle, it has healed and not longer following with wound clinic. Left ankle pain, she takes Meloxicam 15 mg daily prn. Reporting improvement.  Hypothyroidism:S/P radioactive iodine treatment for hyperthyroidism. On Levothyroxine 137 mcg bid W-Thurs-F, rest of days 1 tab.  Lab Results  Component Value Date   TSH 1.880 10/30/2020   Lab Results  Component Value Date   CREATININE 0.71 10/30/2020   BUN 10 10/30/2020   NA 140 10/30/2020   K 3.8 10/30/2020   CL 102 10/30/2020   CO2 24 10/30/2020   HLD on non pharmacologic treatment.  Lab Results   Component Value Date   CHOL 195 10/30/2020   HDL 56 10/30/2020   LDLCALC 131 (H) 10/30/2020   TRIG 43 10/30/2020   CHOLHDL 3.5 10/30/2020   Iron def anemia: She is not on iron supplementation. Past hx of DUB.  Lab Results  Component Value Date   WBC 5.0 10/30/2019   HGB 11.2 10/30/2019   HCT 36.4 10/30/2019   MCV 86 10/30/2019   PLT 304 10/30/2019   Review of Systems  Constitutional:  Negative for appetite change, fatigue and fever.  HENT:  Negative for hearing loss, mouth sores, sore throat, trouble swallowing and voice change.   Eyes:  Negative for redness and visual disturbance.  Respiratory:  Negative for cough, shortness of breath and wheezing.   Cardiovascular:  Negative for chest pain, palpitations and leg swelling.  Gastrointestinal:  Negative for abdominal pain, nausea and vomiting.       No changes in bowel habits.  Endocrine: Negative for cold intolerance, heat intolerance, polydipsia, polyphagia and polyuria.  Genitourinary:  Negative for decreased urine volume, dysuria, hematuria, vaginal bleeding and vaginal discharge.  Musculoskeletal:  Positive for arthralgias. Negative for myalgias.  Skin:  Negative for color change and rash.  Allergic/Immunologic: Positive for environmental allergies.  Neurological:  Negative for syncope, weakness and headaches.  Hematological:  Negative for adenopathy. Does not bruise/bleed easily.  Psychiatric/Behavioral:  Negative for confusion. The patient is not nervous/anxious.   All other systems reviewed and are negative.  Current Outpatient Medications on File Prior to Visit  Medication Sig Dispense Refill   acetaminophen (TYLENOL) 500 MG tablet Take 500 mg by mouth  every 6 (six) hours as needed for mild pain, fever or headache.     levothyroxine (SYNTHROID) 137 MCG tablet TAKE 1 TABLET BY MOUTH TWICE DAILY ON WEDNESDAY, THURSDAY AND FRIDAY, THEN ONCE DAILY ON MONDAY, THURSDAY, SATURDAY AND SUNDAY 120 tablet 2   meloxicam (MOBIC)  15 MG tablet TAKE 1 TABLET(15 MG) BY MOUTH DAILY 30 tablet 3   VITAMIN D PO Take 1 capsule by mouth daily.     VITAMIN E PO Take 1 tablet by mouth daily.     No current facility-administered medications on file prior to visit.   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    Lower extremities   Anemia    past hx    Asthma    childhood   Hypothyroidism    IBS (irritable bowel syndrome)    Multinodular goiter    s/p radioactive I131 ablation, Iatogenic Hypothyroidism   Obesity    Personal history of goiter    with radioactive iodine ablation    Past Surgical History:  Procedure Laterality Date   BREAST CYST EXCISION Right 2012   BREAST SURGERY     biopsy, right   WISDOM TOOTH EXTRACTION     Allergies  Allergen Reactions   Sulfonamide Derivatives Hives   Family History  Problem Relation Age of Onset   Sarcoidosis Brother    Colon cancer Brother    Kidney failure Father    Prostate cancer Father    Hypertension Mother    Diabetes Mother    Heart disease Mother    Heart disease Sister    Pancreatic cancer Brother    Social History   Socioeconomic History   Marital status: Single    Spouse name: Not on file   Number of children: 0   Years of education: 16   Highest education level: Not on file  Occupational History   Occupation: CLINICAL CHEMIST    Employer: [employer]  Tobacco Use   Smoking status: Never   Smokeless tobacco: Never  Vaping Use   Vaping Use: Never used  Substance and Sexual Activity   Alcohol use: No   Drug use: No   Sexual activity: Not Currently    Birth control/protection: None  Other Topics Concern   Not on file  Social History Narrative   Not on file   Social Determinants of Health   Financial Resource Strain: Not on file  Food Insecurity: Not on file  Transportation Needs: Not on file  Physical Activity: Not on file  Stress: Not on file  Social Connections: Not on file    Vitals:   03/05/21 1353  BP: 124/80  Pulse: 97  Resp: 16  SpO2: 99%   Body mass index is 44.63 kg/m.  Wt Readings from Last 3 Encounters:  03/05/21 276 lb 8 oz (125.4 kg)  10/30/19 279 lb 3.2 oz (126.6 kg)  10/16/19 282 lb (127.9 kg)   Physical Exam Vitals and nursing note reviewed.  Constitutional:      General: She is not in acute distress.    Appearance: She is well-developed.  HENT:     Head: Normocephalic and atraumatic.     Right Ear: Hearing, tympanic membrane, ear canal and external ear normal.     Left Ear: Hearing, tympanic membrane, ear canal and external ear normal.     Mouth/Throat:     Mouth: Mucous membranes are moist.     Pharynx: Oropharynx is clear.  Uvula midline.  Eyes:     Extraocular Movements: Extraocular movements intact.     Conjunctiva/sclera: Conjunctivae normal.     Pupils: Pupils are equal, round, and reactive to light.  Neck:     Thyroid: No thyromegaly.     Trachea: No tracheal deviation.  Cardiovascular:     Rate and Rhythm: Normal rate and regular rhythm.     Pulses:          Dorsalis pedis pulses are 2+ on the right side and 2+ on the left side.     Heart sounds: No murmur heard. Pulmonary:     Effort: Pulmonary effort is normal. No respiratory distress.     Breath sounds: Normal breath sounds.  Abdominal:     Palpations: Abdomen is soft. There is no hepatomegaly or mass.     Tenderness: There is no abdominal tenderness.  Genitourinary:    Comments: Deferred to gyn. Musculoskeletal:     Comments: No signs of synovitis appreciated.  Lymphadenopathy:     Cervical: No cervical adenopathy.     Upper Body:     Right upper body: No supraclavicular adenopathy.     Left upper body: No supraclavicular adenopathy.  Skin:    General: Skin is warm.     Findings: No erythema or rash.  Neurological:     General: No focal deficit present.     Mental Status: She is alert and oriented to person, place, and time.     Cranial Nerves: No  cranial nerve deficit.     Deep Tendon Reflexes:     Reflex Scores:      Bicep reflexes are 2+ on the right side and 2+ on the left side.      Patellar reflexes are 2+ on the right side and 2+ on the left side.    Comments: Antalgic gait.  Psychiatric:        Speech: Speech normal.     Comments: Well groomed, good eye contact.   ASSESSMENT AND PLAN:  [name] was here today annual physical examination.  Orders Placed This Encounter  Procedures   Lipid panel   Hemoglobin A1c   CBC (no diff)   Basic Metabolic Panel   TSH   T4, Free   Lab Results  Component Value Date   CREATININE 0.75 03/05/2021   BUN 9 03/05/2021   NA 137 03/05/2021   K 3.8 03/05/2021   CL 98 03/05/2021   CO2 25 03/05/2021   Lab Results  Component Value Date   TSH 2.000 03/05/2021   Lab Results  Component Value Date   WBC 4.1 03/05/2021   HGB 12.4 03/05/2021   HCT 38.6 03/05/2021   MCV 85 03/05/2021   PLT 234 03/05/2021   Lab Results  Component Value Date   HGBA1C 5.7 (H) 03/05/2021   Routine general medical examination at a health care facility We discussed the importance of regular physical activity and healthy diet for prevention of chronic illness and/or complications. Preventive guidelines reviewed. Vaccination up to date. Ca++ and vit D supplementation recommended. Continue female care with gyn. Next CPE in a year. The 10-year ASCVD risk score (Arnett DK, et al., 2019) is: 2.6%   Values used to calculate the score:     Age: 44 years     Sex: Female     Is Non-Hispanic African American: Yes     Diabetic: No     Tobacco smoker: No     Systolic Blood Pressure:  124 mmHg     Is BP treated: No     HDL Cholesterol: 55 mg/dL     Total Cholesterol: 201 mg/dL  Iron deficiency anemia, unspecified iron deficiency anemia type Mild. Further recommendations according to CBC results.  Hyperlipidemia, unspecified hyperlipidemia type Non pharmacologic treatment recommended for  now. Further recommendations will be given according to 10 years CVD risk score and lipid panel numbers.  Iatrogenic hypothyroidism Problem has been stable. No changes in current management.  Screening for endocrine, metabolic and immunity disorder -     Basic metabolic panel; Future -     Hemoglobin A1c; Future  Morbid obesity ([place]) Otherwise stable. We discussed benefits of wt loss as well as adverse effects of obesity. Consistency with healthy diet and physical activity recommended.  Return in 1 year (on 03/05/2022) for CPE and f/u.  Jaquasha Carnevale G. [place], MD  [facility]. [place] office.

## 2021-03-05 ENCOUNTER — Ambulatory Visit (INDEPENDENT_AMBULATORY_CARE_PROVIDER_SITE_OTHER): Payer: 59 | Admitting: Family Medicine

## 2021-03-05 ENCOUNTER — Encounter: Payer: Self-pay | Admitting: Family Medicine

## 2021-03-05 VITALS — BP 124/80 | HR 97 | Resp 16 | Ht 66.0 in | Wt 276.5 lb

## 2021-03-05 DIAGNOSIS — Z1329 Encounter for screening for other suspected endocrine disorder: Secondary | ICD-10-CM

## 2021-03-05 DIAGNOSIS — E032 Hypothyroidism due to medicaments and other exogenous substances: Secondary | ICD-10-CM

## 2021-03-05 DIAGNOSIS — D509 Iron deficiency anemia, unspecified: Secondary | ICD-10-CM

## 2021-03-05 DIAGNOSIS — E785 Hyperlipidemia, unspecified: Secondary | ICD-10-CM

## 2021-03-05 DIAGNOSIS — Z13228 Encounter for screening for other metabolic disorders: Secondary | ICD-10-CM

## 2021-03-05 DIAGNOSIS — Z0000 Encounter for general adult medical examination without abnormal findings: Secondary | ICD-10-CM

## 2021-03-05 DIAGNOSIS — Z130 Encounter for screening for diseases of the blood and blood-forming organs and certain disorders involving the immune mechanism: Secondary | ICD-10-CM

## 2021-03-05 NOTE — Patient Instructions (Addendum)
A few things to remember from today's visit:  Routine general medical examination at a health care facility  Iron deficiency anemia, unspecified iron deficiency anemia type - Plan: CBC  Hyperlipidemia, unspecified hyperlipidemia type - Plan: Lipid panel  Iatrogenic hypothyroidism - Plan: TSH, T4, free  Screening for endocrine, metabolic and immunity disorder - Plan: Basic metabolic panel, Hemoglobin A1c  If you need refills please call your pharmacy. Do not use My Chart to request refills or for acute issues that need immediate attention.   Please be sure medication list is accurate. If a new problem present, please set up appointment sooner than planned today. Arrange appt with your gynecologist.  Health Maintenance, Female Adopting a healthy lifestyle and getting preventive care are important in promoting health and wellness. Ask your health care provider about: The right schedule for you to have regular tests and exams. Things you can do on your own to prevent diseases and keep yourself healthy. What should I know about diet, weight, and exercise? Eat a healthy diet  Eat a diet that includes plenty of vegetables, fruits, low-fat dairy products, and lean protein. Do not eat a lot of foods that are high in solid fats, added sugars, or sodium. Maintain a healthy weight Body mass index (BMI) is used to identify weight problems. It estimates body fat based on height and weight. Your health care provider can help determine your BMI and help you achieve or maintain a healthy weight. Get regular exercise Get regular exercise. This is one of the most important things you can do for your health. Most adults should: Exercise for at least 150 minutes each week. The exercise should increase your heart rate and make you sweat (moderate-intensity exercise). Do strengthening exercises at least twice a week. This is in addition to the moderate-intensity exercise. Spend less time sitting. Even  light physical activity can be beneficial. Watch cholesterol and blood lipids Have your blood tested for lipids and cholesterol at 55 years of age, then have this test every 5 years. Have your cholesterol levels checked more often if: Your lipid or cholesterol levels are high. You are older than 55 years of age. You are at high risk for heart disease. What should I know about cancer screening? Depending on your health history and family history, you may need to have cancer screening at various ages. This may include screening for: Breast cancer. Cervical cancer. Colorectal cancer. Skin cancer. Lung cancer. What should I know about heart disease, diabetes, and high blood pressure? Blood pressure and heart disease High blood pressure causes heart disease and increases the risk of stroke. This is more likely to develop in people who have high blood pressure readings or are overweight. Have your blood pressure checked: Every 3-5 years if you are 36-75 years of age. Every year if you are 60 years old or older. Diabetes Have regular diabetes screenings. This checks your fasting blood sugar level. Have the screening done: Once every three years after age 42 if you are at a normal weight and have a low risk for diabetes. More often and at a younger age if you are overweight or have a high risk for diabetes. What should I know about preventing infection? Hepatitis B If you have a higher risk for hepatitis B, you should be screened for this virus. Talk with your health care provider to find out if you are at risk for hepatitis B infection. Hepatitis C Testing is recommended for: Everyone born from [birth year] through 1964.  Anyone with known risk factors for hepatitis C. Sexually transmitted infections (STIs) Get screened for STIs, including gonorrhea and chlamydia, if: You are sexually active and are younger than 55 years of age. You are older than 55 years of age and your health care provider tells  you that you are at risk for this type of infection. Your sexual activity has changed since you were last screened, and you are at increased risk for chlamydia or gonorrhea. Ask your health care provider if you are at risk. Ask your health care provider about whether you are at high risk for HIV. Your health care provider may recommend a prescription medicine to help prevent HIV infection. If you choose to take medicine to prevent HIV, you should first get tested for HIV. You should then be tested every 3 months for as long as you are taking the medicine. Pregnancy If you are about to stop having your period (premenopausal) and you may become pregnant, seek counseling before you get pregnant. Take 400 to 800 micrograms (mcg) of folic acid every day if you become pregnant. Ask for birth control (contraception) if you want to prevent pregnancy. Osteoporosis and menopause Osteoporosis is a disease in which the bones lose minerals and strength with aging. This can result in bone fractures. If you are 28 years old or older, or if you are at risk for osteoporosis and fractures, ask your health care provider if you should: Be screened for bone loss. Take a calcium or vitamin D supplement to lower your risk of fractures. Be given hormone replacement therapy (HRT) to treat symptoms of menopause. Follow these instructions at home: Alcohol use Do not drink alcohol if: Your health care provider tells you not to drink. You are pregnant, may be pregnant, or are planning to become pregnant. If you drink alcohol: Limit how much you have to: 0-1 drink a day. Know how much alcohol is in your drink. In the [place] one drink equals one 12 oz bottle of beer (355 mL), one 5 oz glass of wine (148 mL), or one 1 oz glass of hard liquor (44 mL). Lifestyle Do not use any products that contain nicotine or tobacco. These products include cigarettes, chewing tobacco, and vaping devices, such as e-cigarettes. If you need help  quitting, ask your health care provider. Do not use street drugs. Do not share needles. Ask your health care provider for help if you need support or information about quitting drugs. General instructions Schedule regular health, dental, and eye exams. Stay current with your vaccines. Tell your health care provider if: You often feel depressed. You have ever been abused or do not feel safe at home. Summary Adopting a healthy lifestyle and getting preventive care are important in promoting health and wellness. Follow your health care provider's instructions about healthy diet, exercising, and getting tested or screened for diseases. Follow your health care provider's instructions on monitoring your cholesterol and blood pressure. This information is not intended to replace advice given to you by your health care provider. Make sure you discuss any questions you have with your health care provider. Document Revised: 06/10/2020 Document Reviewed: 06/10/2020 Elsevier Patient Education  2022 [employer].

## 2021-03-06 LAB — CBC
Hematocrit: 38.6 % (ref 34.0–46.6)
Hemoglobin: 12.4 g/dL (ref 11.1–15.9)
MCH: 27.2 pg (ref 26.6–33.0)
MCHC: 32.1 g/dL (ref 31.5–35.7)
MCV: 85 fL (ref 79–97)
Platelets: 234 10*3/uL (ref 150–450)
RBC: 4.56 x10E6/uL (ref 3.77–5.28)
RDW: 13.2 % (ref 11.7–15.4)
WBC: 4.1 10*3/uL (ref 3.4–10.8)

## 2021-03-06 LAB — LIPID PANEL
Chol/HDL Ratio: 3.7 ratio (ref 0.0–4.4)
Cholesterol, Total: 201 mg/dL — ABNORMAL HIGH (ref 100–199)
HDL: 55 mg/dL (ref >39)
LDL Chol Calc (NIH): 134 mg/dL — ABNORMAL HIGH (ref 0–99)
Triglycerides: 65 mg/dL (ref 0–149)
VLDL Cholesterol Cal: 12 mg/dL (ref 5–40)

## 2021-03-06 LAB — TSH: TSH: 2 u[IU]/mL (ref 0.450–4.500)

## 2021-03-06 LAB — BASIC METABOLIC PANEL
BUN/Creatinine Ratio: 12 (ref 9–23)
BUN: 9 mg/dL (ref 6–24)
CO2: 25 mmol/L (ref 20–29)
Calcium: 9.6 mg/dL (ref 8.7–10.2)
Chloride: 98 mmol/L (ref 96–106)
Creatinine, Ser: 0.75 mg/dL (ref 0.57–1.00)
Glucose: 80 mg/dL (ref 70–99)
Potassium: 3.8 mmol/L (ref 3.5–5.2)
Sodium: 137 mmol/L (ref 134–144)
eGFR: 95 mL/min/{1.73_m2} (ref >59)

## 2021-03-06 LAB — HEMOGLOBIN A1C
Est. average glucose Bld gHb Est-mCnc: 117 mg/dL
Hgb A1c MFr Bld: 5.7 % — ABNORMAL HIGH (ref 4.8–5.6)

## 2021-03-06 LAB — T4, FREE: Free T4: 1.64 ng/dL (ref 0.82–1.77)

## 2021-03-08 MED ORDER — LEVOTHYROXINE SODIUM 137 MCG PO TABS: ORAL_TABLET | ORAL | 3 refills | Status: DC

## 2021-07-16 ENCOUNTER — Other Ambulatory Visit: Payer: Self-pay | Admitting: Family Medicine

## 2021-07-16 DIAGNOSIS — Z1231 Encounter for screening mammogram for malignant neoplasm of breast: Secondary | ICD-10-CM

## 2021-08-25 ENCOUNTER — Telehealth: Payer: Self-pay | Admitting: Family Medicine

## 2021-08-25 ENCOUNTER — Encounter: Payer: Self-pay | Admitting: Internal Medicine

## 2021-08-25 ENCOUNTER — Ambulatory Visit (INDEPENDENT_AMBULATORY_CARE_PROVIDER_SITE_OTHER): Payer: 59 | Admitting: Internal Medicine

## 2021-08-25 VITALS — Temp 99.0°F | Wt 280.0 lb

## 2021-08-25 DIAGNOSIS — R0981 Nasal congestion: Secondary | ICD-10-CM

## 2021-08-25 DIAGNOSIS — U071 COVID-19: Secondary | ICD-10-CM | POA: Diagnosis not present

## 2021-08-25 LAB — POC COVID19 BINAXNOW: SARS Coronavirus 2 Ag: POSITIVE — AB

## 2021-08-25 MED ORDER — NIRMATRELVIR/RITONAVIR (PAXLOVID)TABLET: 3.0000 | ORAL_TABLET | Freq: Two times a day (BID) | ORAL | 0 refills | Status: AC

## 2021-08-25 NOTE — Progress Notes (Signed)
Virtual Visit via Video Note  I connected with [name] on 08/25/21 at  1:30 PM EDT by a video enabled telemedicine application and verified that I am speaking with the correct person using two identifiers.  Location patient: home Location provider: work office Persons participating in the virtual visit: patient, provider  I discussed the limitations of evaluation and management by telemedicine and the availability of in person appointments. The patient expressed understanding and agreed to proceed.   HPI: 3 days ago she started experiencing nasal congestion, runny nose and cough.  She came in the office today where she tested positive for COVID.  She denies fevers, shortness of breath or myalgias.   ROS: Constitutional: Denies fever, chills, diaphoresis, appetite change and fatigue.  HEENT: Denies photophobia, eye pain, redness,  sneezing, mouth sores, trouble swallowing, neck pain, neck stiffness and tinnitus.   Respiratory: Denies SOB, DOE, chest tightness,  and wheezing.   Cardiovascular: Denies chest pain, palpitations and leg swelling.  Gastrointestinal: Denies nausea, vomiting, abdominal pain, diarrhea, constipation, blood in stool and abdominal distention.  Genitourinary: Denies dysuria, urgency, frequency, hematuria, flank pain and difficulty urinating.  Endocrine: Denies: hot or cold intolerance, sweats, changes in hair or nails, polyuria, polydipsia. Musculoskeletal: Denies myalgias, back pain, joint swelling, arthralgias and gait problem.  Skin: Denies pallor, rash and wound.  Neurological: Denies dizziness, seizures, syncope, weakness, light-headedness, numbness and headaches.  Hematological: Denies adenopathy. 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    Lower extremities   Anemia    past hx    Asthma    childhood   Hypothyroidism    IBS (irritable bowel syndrome)    Multinodular goiter    s/p radioactive I131 ablation, Iatogenic Hypothyroidism   Obesity    Personal history of goiter    with radioactive iodine ablation    Past Surgical History:  Procedure Laterality Date   BREAST CYST EXCISION Right 2012   BREAST SURGERY     biopsy, right   WISDOM TOOTH EXTRACTION      Family History  Problem Relation Age of Onset   Sarcoidosis Brother    Colon cancer Brother    Kidney failure Father    Prostate cancer Father    Hypertension Mother    Diabetes Mother    Heart disease Mother    Heart disease Sister    Pancreatic cancer Brother     SOCIAL HX:   reports that she has never smoked. She has never used smokeless tobacco. She reports that she does not drink alcohol and does not use drugs.   Current Outpatient Medications:    acetaminophen (TYLENOL) 500 MG tablet, Take 500 mg by mouth every 6 (six) hours as needed for mild pain, fever or headache., Disp: , Rfl:    levothyroxine (SYNTHROID) 137 MCG tablet, TAKE 1 TABLET BY MOUTH TWICE DAILY ON WEDNESDAY, THURSDAY AND Friday and ONCE DAILY ON MONDAY, THURSDAY, SATURDAY AND SUNDAY, Disp: 120 tablet, Rfl: 3   meloxicam (MOBIC) 15 MG tablet, TAKE 1 TABLET(15 MG) BY MOUTH DAILY, Disp: 30 tablet, Rfl: 3   nirmatrelvir/ritonavir EUA (PAXLOVID) 20 x 150 MG & 10 x 100MG  TABS, Take 3 tablets by mouth 2 (two) times daily for 5 days. (Take nirmatrelvir 150 mg two tablets twice daily for 5 days and ritonavir 100  mg one tablet twice daily for 5 days) Patient GFR is 95, Disp: 30 tablet, Rfl: 0   VITAMIN D PO, Take 1 capsule by mouth daily., Disp: , Rfl:    VITAMIN E PO, Take 1 tablet by mouth daily., Disp: , Rfl:   EXAM:   VITALS per patient if applicable: None reported  GENERAL: alert, oriented, appears well and in no acute distress  HEENT: atraumatic,  conjunttiva clear, no obvious abnormalities on inspection of external nose and ears  NECK: normal movements of the head and neck  LUNGS: on inspection no signs of respiratory distress, breathing rate appears normal, no obvious gross increased work of breathing, gasping or wheezing  CV: no obvious cyanosis  MS: moves all visible extremities without noticeable abnormality  PSYCH/NEURO: pleasant and cooperative, no obvious depression or anxiety, speech and thought processing grossly intact  ASSESSMENT AND PLAN:   Head congestion - Plan: POC COVID-19 BinaxNow  COVID-19 - Plan: nirmatrelvir/ritonavir EUA (PAXLOVID) 20 x 150 MG & 10 x 100MG  TABS  -Se may also use OTC medications such as antihistamines, decongestants, pain relievers, guaifenesin. -We have reviewed quarantine period of 5 days. -We have discussed symptoms that would promote ED evaluation. -She knows to follow with if symptoms fail to resolve.     I discussed the assessment and treatment plan with the patient. The patient was provided an opportunity to ask questions and all were answered. The patient agreed with the plan and demonstrated an understanding of the instructions.   The patient was advised to call back or seek an in-person evaluation if the symptoms worsen or if the condition fails to improve as anticipated.    [place], MD  [facility]

## 2021-08-25 NOTE — Telephone Encounter (Signed)
Pt rquesting a prescription for congestion, pressure around eyes and in head x 3d.declined OV. Doesn't want nasal spray

## 2021-08-25 NOTE — Telephone Encounter (Signed)
Appointment needed

## 2021-10-01 ENCOUNTER — Ambulatory Visit
Admission: RE | Admit: 2021-10-01 | Discharge: 2021-10-01 | Disposition: A | Discharge status: Home / Self Care | Payer: 59 | Source: Ambulatory Visit | Attending: Family Medicine | Admitting: Family Medicine

## 2021-10-01 DIAGNOSIS — Z1231 Encounter for screening mammogram for malignant neoplasm of breast: Secondary | ICD-10-CM

## 2021-10-12 ENCOUNTER — Encounter (HOSPITAL_COMMUNITY): Payer: Self-pay

## 2021-10-12 ENCOUNTER — Ambulatory Visit (HOSPITAL_COMMUNITY)
Admission: EM | Admit: 2021-10-12 | Discharge: 2021-10-12 | Disposition: A | Discharge status: Home / Self Care | Payer: 59

## 2021-10-12 DIAGNOSIS — L299 Pruritus, unspecified: Secondary | ICD-10-CM

## 2021-10-12 DIAGNOSIS — T7840XA Allergy, unspecified, initial encounter: Secondary | ICD-10-CM | POA: Diagnosis not present

## 2021-10-12 NOTE — ED Provider Notes (Signed)
[facility]    CSN: [id number] Arrival date & time: 10/12/21  1031      History   Chief Complaint Chief Complaint  Patient presents with   Otalgia    HPI [name] is a 56 y.o. female.   Pt complains of bilateral ear itching, left worse than right, that started about three days ago.  Pt is worried there is something in her ear.  She reports she has applied mineral oil to the left ear with temporary relief.  She denies rhinorrhea, scratchy throat, eye discharge, congestion.     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    Lower extremities   Anemia    past hx    Asthma    childhood   Hypothyroidism    IBS (irritable bowel syndrome)    Multinodular goiter    s/p radioactive I131 ablation, Iatogenic Hypothyroidism   Obesity    Personal history of goiter    with radioactive iodine ablation    Patient Active Problem List   Diagnosis Date Noted   Hyperlipidemia 10/29/2020   Arthralgia of multiple sites, bilateral 08/13/2015   Anemia, iron deficiency 08/13/2015   Hypothyroidism 05/09/2015   Heel spur 05/07/2015   Achilles tendinitis of left lower extremity 05/07/2015   Family history of coronary artery disease 04/09/2014   Somnolence 06/02/2013   Morbid obesity (HCC) 06/02/2013   Disturbance of skin sensation 03/23/2012   Peripheral neuropathy 02/23/2012   Anemia 02/23/2012   Paraesthesia of skin 06/23/2010   LEG PAIN, RIGHT 03/13/2010   BACK PAIN, THORACIC REGION, RIGHT 03/13/2008   Iatrogenic hypothyroidism 11/02/2006   ASTHMA 11/02/2006   IRRITABLE BOWEL SYNDROME 11/02/2006   BREAST BIOPSY, HX OF 11/02/2006    Past Surgical History:  Procedure Laterality Date   BREAST CYST EXCISION Right 2012   BREAST SURGERY     biopsy, right   WISDOM TOOTH EXTRACTION      OB History     Gravida  0   Para  0   Term  0   Preterm  0   AB  0   Living  0      SAB  0   IAB  0    Ectopic  0   Multiple  0   Live Births  0            Home Medications    Prior to Admission medications   Medication Sig Start Date End Date Taking? Authorizing Provider  acetaminophen (TYLENOL) 500 MG tablet Take 500 mg by mouth every 6 (six) hours as needed for mild pain, fever or headache.    [provider]  levothyroxine (SYNTHROID) 137 MCG tablet TAKE 1 TABLET BY MOUTH TWICE DAILY ON WEDNESDAY, THURSDAY AND Friday and ONCE DAILY ON MONDAY, THURSDAY, SATURDAY AND SUNDAY 03/08/21   [place], Betty G, MD  meloxicam (MOBIC) 15 MG tablet TAKE 1 TABLET(15 MG) BY MOUTH DAILY 11/22/20   [place], Betty G, MD  VITAMIN D PO Take 1 capsule by mouth daily.    [provider]  VITAMIN E PO Take 1 tablet by mouth daily.    [provider]    Family History Family History  Problem Relation Age of Onset   Hypertension Mother    Diabetes Mother    Heart disease Mother    Kidney failure Father    Prostate cancer Father    Heart disease Sister  Breast cancer Maternal Aunt    Breast cancer Maternal Aunt    Breast cancer Cousin    Sarcoidosis Brother    Colon cancer Brother    Pancreatic cancer Brother     Social History Social History   Tobacco Use   Smoking status: Never   Smokeless tobacco: Never  Vaping Use   Vaping Use: Never used  Substance Use Topics   Alcohol use: No   Drug use: No     Allergies   Sulfamethoxazole-trimethoprim and Sulfonamide derivatives   Review of Systems Review of Systems  Constitutional:  Negative for chills and fever.  HENT:  Negative for congestion, ear pain, postnasal drip, rhinorrhea and sore throat.        Ear itching  Eyes:  Negative for pain and visual disturbance.  Respiratory:  Negative for cough and shortness of breath.   Cardiovascular:  Negative for chest pain and palpitations.  Gastrointestinal:  Negative for abdominal pain and vomiting.  Genitourinary:  Negative for dysuria and hematuria.   Musculoskeletal:  Negative for arthralgias and back pain.  Skin:  Negative for color change and rash.  Neurological:  Negative for seizures and syncope.  All other systems reviewed and are negative.    Physical Exam Triage Vital Signs ED Triage Vitals  Enc Vitals Group     BP 10/12/21 1109 (!) 147/82     Pulse Rate 10/12/21 1109 71     Resp 10/12/21 1109 12     Temp 10/12/21 1109 98.1 F (36.7 C)     Temp Source 10/12/21 1109 Oral     SpO2 10/12/21 1109 98 %     Weight 10/12/21 1107 281 lb (127.5 kg)     Height 10/12/21 1107 5\' 7"  (1.702 m)     Head Circumference --      Peak Flow --      Pain Score 10/12/21 1108 0     Pain Loc --      Pain Edu? --      Excl. in GC? --    No data found.  Updated Vital Signs BP (!) 147/82 (BP Location: Right Arm)   Pulse 71   Temp 98.1 F (36.7 C) (Oral)   Resp 12   Ht 5\' 7"  (1.702 m)   Wt 281 lb (127.5 kg)   SpO2 98%   BMI 44.01 kg/m   Visual Acuity Right Eye Distance:   Left Eye Distance:   Bilateral Distance:    Right Eye Near:   Left Eye Near:    Bilateral Near:     Physical Exam Vitals and nursing note reviewed.  Constitutional:      General: She is not in acute distress.    Appearance: She is well-developed.  HENT:     Head: Normocephalic and atraumatic.     Right Ear: Hearing, tympanic membrane and ear canal normal. Tympanic membrane is not erythematous or bulging.     Left Ear: Hearing, tympanic membrane and ear canal normal. Tympanic membrane is not erythematous or bulging.  Eyes:     Conjunctiva/sclera: Conjunctivae normal.  Cardiovascular:     Rate and Rhythm: Normal rate and regular rhythm.     Heart sounds: No murmur heard. Pulmonary:     Effort: Pulmonary effort is normal. No respiratory distress.     Breath sounds: Normal breath sounds.  Abdominal:     Palpations: Abdomen is soft.     Tenderness: There is no abdominal tenderness.  Musculoskeletal:  General: No swelling.     Cervical back:  Neck supple.  Skin:    General: Skin is warm and dry.     Capillary Refill: Capillary refill takes less than 2 seconds.  Neurological:     Mental Status: She is alert.  Psychiatric:        Mood and Affect: Mood normal.      UC Treatments / Results  Labs (all labs ordered are listed, but only abnormal results are displayed) Labs Reviewed - No data to display  EKG   Radiology No results found.  Procedures Procedures (including critical care time)  Medications Ordered in UC Medications - No data to display  Initial Impression / Assessment and Plan / UC Course  I have reviewed the triage vital signs and the nursing notes.  Pertinent labs & imaging results that were available during my care of the patient were reviewed by me and considered in my medical decision making (see chart for details).     Ear itching.  Bilateral ears normal on exam.  Sx likely related to allergies.  Advised daily allergy medication.  Follow up with PCP if no improvement.  Final Clinical Impressions(s) / UC Diagnoses   Final diagnoses:  Itching of ear  Allergy, initial encounter     Discharge Instructions      Recommend daily allergy medication like Claritin or Zyrtec Can use daily flonase if needed Follow up with Primary Care Physician if no improvement    ED Prescriptions   None    PDMP not reviewed this encounter.   Ward, Tylene Fantasia, PA-C 10/12/21 1121

## 2021-10-12 NOTE — ED Triage Notes (Signed)
Pt is here for both ears feeling itchy or like something is moving in the ears x 3days

## 2021-10-12 NOTE — Discharge Instructions (Signed)
Recommend daily allergy medication like Claritin or Zyrtec Can use daily flonase if needed Follow up with Primary Care Physician if no improvement

## 2021-10-23 NOTE — Progress Notes (Signed)
HPI: [name] is a 56 y.o. female with hx of hypothyroidism, HLD, OA, and IBS here today c/o fullness rectal sensation for the past few days. She has been constipated, small and hard bowel movements for 4 days. She acknowledges she is not drinking enough water and not been consistent with adequate fiber intake. She took miralax 3 days ago and apple juice, next day she had a "big" bowel movement, rectal fullness sensation improved but did not resolve. Denies dyschezia. Negative for fever, chills, abnormal wt loss,  night sweats, abdominal pain, nausea, vomiting, changes in bowel habits, blood in stool or melena. Last bowel movement today.  Colonoscopy 02/23/17, 5 years f/u was recommended. Brother with hx of colon cancer.  Hypothyroidism on Levothyroxine 137 mcg daily. Lab Results  Component Value Date   TSH 2.000 03/05/2021   She has not been exercising regularly, exacerbates ankle pain. She is active at work. Not consistent with following a healthful diet.  Review of Systems  Constitutional:  Negative for activity change and appetite change.  HENT:  Negative for mouth sores, nosebleeds, sore throat and trouble swallowing.   Respiratory:  Negative for cough and shortness of breath.   Cardiovascular:  Negative for chest pain and palpitations.  Endocrine: Negative for cold intolerance and heat intolerance.  Genitourinary:  Negative for decreased urine volume, dysuria and hematuria.  Musculoskeletal:  Positive for arthralgias.  Neurological:  Negative for syncope and weakness.  Hematological:  Negative for adenopathy. Does not bruise/bleed easily.  Rest see pertinent positives and negatives per HPI.  Current Outpatient Medications on File Prior to Visit  Medication Sig Dispense Refill   acetaminophen (TYLENOL) 500 MG tablet Take 500 mg by mouth every 6 (six) hours as needed for mild pain, fever or headache.     levothyroxine (SYNTHROID) 137 MCG tablet TAKE 1 TABLET BY  MOUTH TWICE DAILY ON WEDNESDAY, THURSDAY AND Friday and ONCE DAILY ON MONDAY, THURSDAY, SATURDAY AND SUNDAY 120 tablet 3   meloxicam (MOBIC) 15 MG tablet TAKE 1 TABLET(15 MG) BY MOUTH DAILY 30 tablet 3   VITAMIN D PO Take 1 capsule by mouth daily.     VITAMIN E PO Take 1 tablet by mouth daily.     No current facility-administered medications on file prior to visit.   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    Lower extremities   Anemia    past hx    Asthma    childhood   Hypothyroidism    IBS (irritable bowel syndrome)    Multinodular goiter    s/p radioactive I131 ablation, Iatogenic Hypothyroidism   Obesity    Personal history of goiter    with radioactive iodine ablation   Allergies  Allergen Reactions   Sulfamethoxazole-Trimethoprim Hives   Sulfonamide Derivatives Hives   Social History   Socioeconomic History   Marital status: Single    Spouse name: Not on file   Number of children: 0   Years of education: 16   Highest education level: Not on file  Occupational History   Occupation: CLINICAL CHEMIST    Employer: [employer]  Tobacco Use   Smoking status: Never   Smokeless tobacco: Never  Vaping Use   Vaping Use: Never used  Substance and Sexual Activity   Alcohol use: No   Drug use: No   Sexual activity: Not Currently    Birth control/protection: None  Other Topics Concern   Not on file  Social History Narrative   Not on file   Social Determinants of Health   Financial Resource Strain: Not on file  Food Insecurity: Not on file  Transportation Needs: Not on file  Physical Activity: Not on file  Stress: Not on file  Social Connections: Not on file   Vitals:   10/24/21 1549  BP: 132/87  Pulse: 88  Resp: 16  SpO2: 99%   Wt Readings from Last 3 Encounters:  10/24/21 282 lb 8 oz (128.1 kg)  10/12/21 281 lb (127.5 kg)  08/25/21 280 lb (127 kg)  Body mass index is 44.25 kg/m. Physical  Exam Vitals and nursing note reviewed. Exam conducted with a chaperone present.  Constitutional:      General: She is not in acute distress.    Appearance: She is well-developed. She is not ill-appearing.  HENT:     Head: Normocephalic and atraumatic.     Mouth/Throat:     Mouth: Mucous membranes are moist.  Eyes:     General: No scleral icterus.    Conjunctiva/sclera: Conjunctivae normal.  Cardiovascular:     Rate and Rhythm: Normal rate and regular rhythm.     Heart sounds: No murmur heard. Pulmonary:     Effort: Pulmonary effort is normal. No respiratory distress.     Breath sounds: Normal breath sounds.  Abdominal:     General: Bowel sounds are normal.     Palpations: Abdomen is soft. There is no hepatomegaly or mass.     Tenderness: There is no abdominal tenderness.  Genitourinary:    Rectum: Guaiac result negative.     Comments: Hard stool in rectal voult. No masses or ulcers appreciated upon palpation. Lymphadenopathy:     Cervical: No cervical adenopathy.  Skin:    General: Skin is warm.     Findings: No erythema or rash.  Neurological:     Mental Status: She is alert and oriented to person, place, and time.     Comments: Antalgic gait with limping, not assisted.  Psychiatric:        Mood and Affect: Mood and affect normal.   ASSESSMENT AND PLAN:  Ms.[name] was seen today for follow-up.  Diagnoses and all orders for this visit:  Rectal fullness due to feces Improved with defecation. No masses appreciated with digital rectal exam and negative guaiac. If not resolved after bowel movements improve, GI evaluation will be recommended.  -     bisacodyl (DULCOLAX) 10 MG suppository; Place 1 suppository (10 mg total) rectally daily as needed for moderate constipation.  Constipation, unspecified constipation type Miralax daily as needed and Bisacodyl suppositories recommended. Increase water and fiber intake. Instructed about warning signs. If not resolved,  Linzess can be considered.   -     bisacodyl (DULCOLAX) 10 MG suppository; Place 1 suppository (10 mg total) rectally daily as needed for moderate constipation.  Morbid obesity (HCC) She understands the benefits of wt loss as well as adverse effects of obesity. Consistency with healthy diet and physical activity encouraged.  Colon cancer screening Due in 02/2022. GI referral placed.  Need for influenza vaccination -     Flu Vaccine QUAD 57mo+IM (Fluarix, Fluzone & Alfiuria Quad PF)  Return if symptoms worsen or fail to improve.  Valisa Karpel G. [place], MD  [facility]. [place] office.

## 2021-10-24 ENCOUNTER — Ambulatory Visit (INDEPENDENT_AMBULATORY_CARE_PROVIDER_SITE_OTHER): Payer: 59 | Admitting: Family Medicine

## 2021-10-24 ENCOUNTER — Encounter: Payer: Self-pay | Admitting: Family Medicine

## 2021-10-24 VITALS — BP 132/87 | HR 88 | Resp 16 | Ht 67.0 in | Wt 282.5 lb

## 2021-10-24 DIAGNOSIS — Z1211 Encounter for screening for malignant neoplasm of colon: Secondary | ICD-10-CM | POA: Diagnosis not present

## 2021-10-24 DIAGNOSIS — K5900 Constipation, unspecified: Secondary | ICD-10-CM | POA: Diagnosis not present

## 2021-10-24 DIAGNOSIS — Z23 Encounter for immunization: Secondary | ICD-10-CM

## 2021-10-24 DIAGNOSIS — R198 Other specified symptoms and signs involving the digestive system and abdomen: Secondary | ICD-10-CM

## 2021-10-24 MED ORDER — BISACODYL 10 MG RE SUPP: 10.0000 mg | Freq: Every day | RECTAL | 2 refills | Status: DC | PRN

## 2021-10-24 NOTE — Patient Instructions (Addendum)
A few things to remember from today's visit:   Rectal fullness due to feces - Plan: bisacodyl (DULCOLAX) 10 MG suppository  Need for influenza vaccination - Plan: Flu Vaccine QUAD 60mo+IM (Fluarix, Fluzone & Alfiuria Quad PF)  Constipation, unspecified constipation type - Plan: bisacodyl (DULCOLAX) 10 MG suppository  Colon cancer screening - Plan: Ambulatory referral to Gastroenterology Daily Miralax as needed and dulcolax suppository. Increase water and fiber intake.   If you need refills for medications you take chronically, please call your pharmacy. Do not use My Chart to request refills or for acute issues that need immediate attention. If you send a my chart message, it may take a few days to be addressed, specially if I am not in the office.  Please be sure medication list is accurate. If a new problem present, please set up appointment sooner than planned today.

## 2021-11-19 ENCOUNTER — Ambulatory Visit (INDEPENDENT_AMBULATORY_CARE_PROVIDER_SITE_OTHER): Payer: 59 | Admitting: Podiatry

## 2021-11-19 ENCOUNTER — Telehealth: Payer: Self-pay | Admitting: Podiatry

## 2021-11-19 DIAGNOSIS — G5753 Tarsal tunnel syndrome, bilateral lower limbs: Secondary | ICD-10-CM

## 2021-11-19 MED ORDER — LIDOCAINE 5 % EX OINT
1.0000 | TOPICAL_OINTMENT | CUTANEOUS | 0 refills | Status: AC | PRN
Start: 2021-11-19 — End: ?

## 2021-11-19 MED ORDER — GABAPENTIN 100 MG PO CAPS: 100.0000 mg | ORAL_CAPSULE | Freq: Three times a day (TID) | ORAL | 3 refills | Status: DC

## 2021-11-19 MED ORDER — LIDOCAINE 5 % EX OINT: 1.0000 | TOPICAL_OINTMENT | CUTANEOUS | 0 refills | Status: DC | PRN

## 2021-11-19 NOTE — Telephone Encounter (Signed)
Pt stated that the pharmacy wont release the Rx  Lidocaine 5 % 1 Application Topical As needed due to not instructing how many times to take a day. Ans Pt stated you discussed another Rx Gabapentin.  Please advise

## 2021-11-19 NOTE — Progress Notes (Signed)
Subjective:  Patient ID: [name], female    DOB: Apr 06, 1965,  MRN: [id number]  Chief Complaint  Patient presents with   Numbness    56 y.o. female presents with the above complaint.  Patient presents with bilateral tarsal tunnel syndrome with underlying pes planovalgus deformity.  Patient states been present for quite some time she states that she gets numbness tingling down into the medial side of her foot and into the arches.  She wanted to get it evaluated she has not seen anyone else prior to seeing me.  The left side is worse than right side.   Review of Systems: Negative except as noted in the HPI. Denies N/V/F/Ch.  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    Lower extremities   Anemia    past hx    Asthma    childhood   Hypothyroidism    IBS (irritable bowel syndrome)    Multinodular goiter    s/p radioactive I131 ablation, Iatogenic Hypothyroidism   Obesity    Personal history of goiter    with radioactive iodine ablation    Current Outpatient Medications:    lidocaine (XYLOCAINE) 5 % ointment, Apply 1 Application topically as needed., Disp: 35.44 g, Rfl: 0   acetaminophen (TYLENOL) 500 MG tablet, Take 500 mg by mouth every 6 (six) hours as needed for mild pain, fever or headache., Disp: , Rfl:    bisacodyl (DULCOLAX) 10 MG suppository, Place 1 suppository (10 mg total) rectally daily as needed for moderate constipation., Disp: 12 suppository, Rfl: 2   gabapentin (NEURONTIN) 100 MG capsule, Take 1 capsule (100 mg total) by mouth 3 (three) times daily., Disp: 90 capsule, Rfl: 3   levothyroxine (SYNTHROID) 137 MCG tablet, TAKE 1 TABLET BY MOUTH TWICE DAILY ON WEDNESDAY, THURSDAY AND Friday and ONCE DAILY ON MONDAY, THURSDAY, SATURDAY AND SUNDAY, Disp: 120 tablet, Rfl: 3   lidocaine (XYLOCAINE) 5 % ointment, Apply 1 Application topically as needed., Disp: 35.44 g, Rfl: 0   meloxicam (MOBIC) 15 MG tablet,  TAKE 1 TABLET(15 MG) BY MOUTH DAILY, Disp: 30 tablet, Rfl: 3   VITAMIN D PO, Take 1 capsule by mouth daily., Disp: , Rfl:    VITAMIN E PO, Take 1 tablet by mouth daily., Disp: , Rfl:   Social History   Tobacco Use  Smoking Status Never  Smokeless Tobacco Never    Allergies  Allergen Reactions   Sulfamethoxazole-Trimethoprim Hives   Sulfonamide Derivatives Hives   Objective:  There were no vitals filed for this visit. There is no height or weight on file to calculate BMI. Constitutional Well developed. Well nourished.  Vascular Dorsalis pedis pulses palpable bilaterally. Posterior tibial pulses palpable bilaterally. Capillary refill normal to all digits.  No cyanosis or clubbing noted. Pedal hair growth normal.  Neurologic Normal speech. Oriented to person, place, and time. No Tinel's sign noted at the tarsal tunnel.  No common peroneal nerve compression noted.  Left is greater than right side.  No soft tissue mass or lesion noted  Dermatologic Nails within normal limits Skin with normal limits  Orthopedic: Normal joint ROM without pain or crepitus bilaterally. No visible deformities. No bony tenderness.   Radiographs: None Assessment:   1. Tarsal tunnel syndrome of both lower extremities    Plan:  Patient was evaluated and treated and all questions answered.  Bilateral tarsal tunnel left greater than right side -All questions and concerns were discussed with the patient in  extensive detail given the amount of pain and pressure that she has in the setting of underlying pes planovalgus deformity I believe patient will benefit from nerve conduction study to assess for tarsal tunnel syndrome.  If it is present patient will need surgical intervention to undergo this at the tarsal tunnel.  She agrees with the plan and would like to proceed with the study -Nerve conduction study was ordered  No follow-ups on file.

## 2021-11-28 ENCOUNTER — Ambulatory Visit: Payer: 59 | Admitting: Podiatry

## 2021-12-08 ENCOUNTER — Other Ambulatory Visit: Payer: Self-pay

## 2021-12-08 DIAGNOSIS — E032 Hypothyroidism due to medicaments and other exogenous substances: Secondary | ICD-10-CM

## 2021-12-08 MED ORDER — LEVOTHYROXINE SODIUM 137 MCG PO TABS: ORAL_TABLET | ORAL | 3 refills | Status: DC

## 2022-01-07 ENCOUNTER — Encounter: Payer: Self-pay | Admitting: Gastroenterology

## 2022-02-10 ENCOUNTER — Ambulatory Visit (AMBULATORY_SURGERY_CENTER): Payer: 59 | Admitting: *Deleted

## 2022-02-10 VITALS — Ht 67.0 in | Wt 278.0 lb

## 2022-02-10 DIAGNOSIS — Z800 Family history of malignant neoplasm of digestive organs: Secondary | ICD-10-CM

## 2022-02-10 MED ORDER — PEG 3350-KCL-NA BICARB-NACL 420 G PO SOLR: 4000.0000 mL | Freq: Once | ORAL | 0 refills | Status: AC

## 2022-02-10 NOTE — Progress Notes (Signed)

## 2022-02-24 ENCOUNTER — Encounter: Payer: Self-pay | Admitting: Gastroenterology

## 2022-02-25 ENCOUNTER — Encounter: Payer: Self-pay | Admitting: Certified Registered Nurse Anesthetist

## 2022-03-03 ENCOUNTER — Ambulatory Visit (AMBULATORY_SURGERY_CENTER): Payer: 59 | Admitting: Gastroenterology

## 2022-03-03 ENCOUNTER — Encounter: Payer: Self-pay | Admitting: Gastroenterology

## 2022-03-03 VITALS — BP 169/90 | HR 71 | Temp 98.7°F | Resp 13 | Ht 67.0 in | Wt 278.0 lb

## 2022-03-03 DIAGNOSIS — Z1211 Encounter for screening for malignant neoplasm of colon: Secondary | ICD-10-CM

## 2022-03-03 DIAGNOSIS — Z800 Family history of malignant neoplasm of digestive organs: Secondary | ICD-10-CM

## 2022-03-03 MED ORDER — SODIUM CHLORIDE 0.9 % IV SOLN: 500.0000 mL | Freq: Once | INTRAVENOUS | Status: DC

## 2022-03-03 NOTE — Progress Notes (Signed)
Report given to PACU, vss 

## 2022-03-03 NOTE — Patient Instructions (Addendum)
HANDOUTS PROVIDED ON: HIGH FIBER DIET, DIVERTICULOSIS, & HEMORRHOIDS  Your next colonoscopy should occur in 5 years..    You may resume your previous diet and medication schedule.  Thank you for allowing [place] to care for you today!!!   YOU HAD AN ENDOSCOPIC PROCEDURE TODAY AT [facility]:   Refer to the procedure report that was given to you for any specific questions about what was found during the examination.  If the procedure report does not answer your questions, please call your gastroenterologist to clarify.  If you requested that your care partner not be given the details of your procedure findings, then the procedure report has been included in a sealed envelope for you to review at your convenience later.  YOU SHOULD EXPECT: Some feelings of bloating in the abdomen. Passage of more gas than usual.  Walking can help get rid of the air that was put into your GI tract during the procedure and reduce the bloating. If you had a lower endoscopy (such as a colonoscopy or flexible sigmoidoscopy) you may notice spotting of blood in your stool or on the toilet paper. If you underwent a bowel prep for your procedure, you may not have a normal bowel movement for a few days.  Please Note:  You might notice some irritation and congestion in your nose or some drainage.  This is from the oxygen used during your procedure.  There is no need for concern and it should clear up in a day or so.  SYMPTOMS TO REPORT IMMEDIATELY:  Following lower endoscopy (colonoscopy or flexible sigmoidoscopy):  Excessive amounts of blood in the stool  Significant tenderness or worsening of abdominal pains  Swelling of the abdomen that is new, acute  Fever of 100F or higher  For urgent or emergent issues, a gastroenterologist can be reached at any hour by calling [phone]. Do not use MyChart messaging for urgent concerns.    DIET:  We do recommend a small meal at first, but then you may proceed  to your regular diet.  Drink plenty of fluids but you should avoid alcoholic beverages for 24 hours.  ACTIVITY:  You should plan to take it easy for the rest of today and you should NOT DRIVE or use heavy machinery until tomorrow (because of the sedation medicines used during the test).    FOLLOW UP: Our staff will call the number listed on your records the next business day following your procedure.  We will call around 7:15- 8:00 am to check on you and address any questions or concerns that you may have regarding the information given to you following your procedure. If we do not reach you, we will leave a message.     If any biopsies were taken you will be contacted by phone or by letter within the next 1-3 weeks.  Please call us at [phone] if you have not heard about the biopsies in 3 weeks.    SIGNATURES/CONFIDENTIALITY: You and/or your care partner have signed paperwork which will be entered into your electronic medical record.  These signatures attest to the fact that that the information above on your After Visit Summary has been reviewed and is understood.  Full responsibility of the confidentiality of this discharge information lies with you and/or your care-partner.   The Low FODMAP Diet (FODMAP=Fermentable Oligo-Di-Monosaccharides and Polyols) FODMAPs are carbohydrates (sugars) that are found in foods. Not all carbohydrates are considered FODMAPs. The FODMAPs in the diet are: ?  Fructose (fruits, honey, high fructose corn syrup (HFCS), etc) ? Lactose (dairy) ? Fructans (wheat, onion, garlic, etc) ? Galactans (beans, lentils, legumes such as soy, etc) ? Polyols (sweeteners containing sorbitol, mannitol, xylitol, maltitol, stone fruits such as avocado, apricots, cherries, nectarines, peaches, plums, etc)  FODMAPs are osmotic (means they pull water into the intestinal tract), may not be digested or absorbed well and could be fermented upon by bacteria in the intestinal tract  when eaten in excess.  Symptoms of gas, bloating, cramping and/or diarrhea may occur in those who could be sensitive to the effects of FODMAPs.  A low FODMAP diet, which will limit foods high in fructose, lactose, fructans, galactans and polyols may help reduce symptoms.  The low FODMAP diet is often used in those with irritable bowel syndrome (IBS). The diet also has potential use in those with similar symptoms arising from other digestive disorders such as inflammatory bowel disease.  This diet will also limit fiber as some high fiber foods have also high amounts of FODMAPs. (Fiber is a component of complex carbohydrates that the body cannot digest, found in plant based foods such as beans, fruits, vegetables, whole grains, etc)  FODMAP Food Choices Meats, Poultry, Rish, Eggs: Foods to Eat:  beef, chicken, canned tuna, eggs, egg whites, fish, lamb, pork, shellfish, [place], cold cuts Foods to Limit: foods made with high FODMAP fruit sauces or with HFCS  Dairy Foods to Eat: lactose free dairy, small amounts WU:XLKGM cheese, half and half, hard cheeses (cheddar, colby, parmesan,swiss), mozzarella, sherbet Foods to Limit: buttermilk, chocolate, cottage cheese, ice cream, creamy/cheesy sauces, milk (from cow, sheep or goat), sweetened condensed milk, evaporated milk, soft cheeses (brie, ricotta), sour cream, whipped cream, yogurt  Non-Dairy Alternatives Food to Eat: almond milk, rice milk, rice milk ice cream, nuts, nut butters, seeds Food to Limit: coconut milk, coconut cream, beans, black eyed peas, hummus, lentils, pistachios, soy products  Grains:  Foods to Eat: wheat free grains/wheat free flours (gluten free grains are wheat free): bagels, breads, hot/cold cereals (corn flakes, cheerios, cream of rice, grits, oats, etc), crackers, noodles, pastas, quinoa, pancakes, pretzels, rice, tapioca, tortillas, waffles Foods to Limit: chicory root, inulin, grains with HFCS or made from wheat  (terms for wheat: einkorn, emmer, kamut, spelt), wheat flours (terms for wheat flour: bromated, durum, enriched, farina, graham, semolina, white flours), flour tortillas, rye  Fruits: Foods to Eat: bananas, berries, cantaloupe, grapes, grapefruit, honeydew, kiwi, [place], lemon, lime, mandarin, orange, passion fruit, pineapple, rhubarb, tangerine Foods to Limit: avocado, apples, applesauce, apricots, dates, canned fruit, cherries, dried fruits, figs, guava, lychee, mango, nectarines, pears, papaya, peaches, plums, prunes, persimmon, watermelon  Vegetables: Foods to Eat: bamboo shoots, bell peppers, bok choy, cucumbers, carrots, celery, corn, eggplant, lettuce, leafy greens pumpkin, potatoes, squash, yams, (butternut, winter), tomatoes, zucchini Foods to Limit: artichokes, asparagus, beets, leeks, broccoli, brussel sprouts, cabbage, cauliflower, fennel, green beans, mushrooms, okra, snow peas, summer squash  Desserts:  Foods to Eat: any made with allowed foods  Foods to Limit: any with HFCS or made with foods to limit  Beverages:  Foods to Eat: low FODMAP fruit/vegetable juices (limit to  cup at a time), coffee, tea Foods to Limit: any with HFCS high FODMAP fruit/vegetable juices, fortified wines (sherry, port)  Seasonings and Condiments: Foods to Eat: most spices and herbs, homemade broth, butter, chives, flaxseed, garlic flavored oil, garlic powder, olives, margarine, mayonnaise, onion powder, olive oil, pepper, salt, sugar, maple syrup without HFCS, mustard, low FODMAP salad dressings, soy sauce,  marinara sauce (small amounts), vinegar, balsamic vinegar  Foods to Limits: HFCS, agave, chutneys, coconut, garlic, honey, jams, jellies, molasses, onions, pickle, relish, high FODMAP fruit/vegetable sauces, salad dressings made with high FODMAPs, artificial sweeteners: sorbitol, mannitol, isomalt, xylitol (cough drops, gums,mints)  Tips for a low FODMAP diet: ? Follow the diet for 6  weeks. After this, add high FODMAP foods one at a time back into the diet in small amounts to identify foods that could be "triggers" to your symptoms. Limit foods that trigger your symptoms. ? Read food labels. Avoid foods made with high FODMAPs such as high FODMAP fruits, HFCS, honey, inulin, wheat, soy, etc. However, a food could be an overall low FODMAP food if a high FODMAP food listed as the last ingredient. ? Buy gluten free grains as they are wheat free. However, you do not need to follow a 100% gluten free diet as the focus is on FODMAPs, not gluten. Look for gluten free grains made with low FODMAPs, such as potato, quinoa, rice or corn. Avoid gluten free grains made with high FODMAPs. ? Limit serving sizes for low FODMAP fruits/vegetables and high fiber/low FODMAP foods such as quinoa to a  cup per meal ( cup=size of a tennis ball) if you have symptoms after eating these foods. The symptoms could be related to eating large amounts of low FODMAPs or fiber all at once.  Low FODMAP Meals and Snack Ideas 1. gluten free waffle with walnuts, blueberries, maple syrup without HFCS 2. eggs scrambled with spinach, bell peppers and cheddar cheese 3. oatmeal topped with sliced banana, almonds and brown sugar 4. fruit smoothie blended with lactose free vanilla yogurt and strawberries 5. rice pasta with chicken, tomatoes, spinach topped with pesto sauce 6. chicken salad mixed with chicken, lettuce, bell peppers, cucumbers, tomatoes, balsamic vinegar salad dressing 7. [place] wrap with gluten free tortilla, sliced [place], lettuce, tomato, slice of cheddar cheese slice, mayonnaise, mustard 8. ham and swiss cheese sandwich on gluten free bread, with mayonnaise, mustard 9. quesadilla with corn or gluten free tortilla and cheddar cheese 10. beef and vegetable stew (made with homemade broth, beef, allowed vegetables)

## 2022-03-03 NOTE — Op Note (Signed)
[place] Patient Name: [name] Procedure Date: 03/03/2022 1:58 PM MRN: [id number] Endoscopist: Justice Britain , MD, [id number] Age: 57 Referring MD:  Date of Birth: 1965-07-27 Gender: Female Account #: [id number] Procedure:                Colonoscopy Indications:              Screening in patient at increased risk: Colorectal                            cancer in brother before age 87 Medicines:                Monitored Anesthesia Care Procedure:                Pre-Anesthesia Assessment:                           - Prior to the procedure, a History and Physical                            was performed, and patient medications and                            allergies were reviewed. The patient's tolerance of                            previous anesthesia was also reviewed. The risks                            and benefits of the procedure and the sedation                            options and risks were discussed with the patient.                            All questions were answered, and informed consent                            was obtained. Prior Anticoagulants: The patient has                            taken no anticoagulant or antiplatelet agents                            except for NSAID medication. ASA Grade Assessment:                            III - A patient with severe systemic disease. After                            reviewing the risks and benefits, the patient was                            deemed in satisfactory condition to undergo the  procedure.                           After obtaining informed consent, the colonoscope                            was passed under direct vision. Throughout the                            procedure, the patient's blood pressure, pulse, and                            oxygen saturations were monitored continuously. The                            Olympus CF-HQ190L ([id number])  Colonoscope was                            introduced through the anus and advanced to the the                            cecum, identified by appendiceal orifice and                            ileocecal valve. The colonoscopy was performed                            without difficulty. The patient tolerated the                            procedure. The quality of the bowel preparation was                            adequate. The ileocecal valve, appendiceal orifice,                            and rectum were photographed. Scope In: 2:14:40 PM Scope Out: 2:26:08 PM Scope Withdrawal Time: 0 hours 8 minutes 32 seconds  Total Procedure Duration: 0 hours 11 minutes 28 seconds  Findings:                 The digital rectal exam was normal. Pertinent                            negatives include no palpable rectal lesions.                           Multiple small-mouthed diverticula were found in                            the recto-sigmoid colon and sigmoid colon.                           Normal mucosa was found in the entire colon.  Non-bleeding non-thrombosed internal hemorrhoids                            were found during retroflexion. The hemorrhoids                            were Grade I (internal hemorrhoids that do not                            prolapse). Complications:            No immediate complications. Estimated Blood Loss:     Estimated blood loss: none. Impression:               - Diverticulosis in the recto-sigmoid colon and in                            the sigmoid colon.                           - Normal mucosa in the entire examined colon.                           - Non-bleeding non-thrombosed internal hemorrhoids. Recommendation:           - The patient will be observed post-procedure,                            until all discharge criteria are met.                           - Discharge patient to home.                           - Patient has  a contact number available for                            emergencies. The signs and symptoms of potential                            delayed complications were discussed with the                            patient. Return to normal activities tomorrow.                            Written discharge instructions were provided to the                            patient.                           - High fiber diet.                           - Use FiberCon 1-2 tablets PO daily.                           -  Repeat colonoscopy in 5 years for high-risk                            screening purposes.                           - The findings and recommendations were discussed                            with the patient.                           - The findings and recommendations were discussed                            with the designated responsible adult. Justice Britain, MD 03/03/2022 2:36:38 PM

## 2022-03-03 NOTE — Progress Notes (Signed)
GASTROENTEROLOGY PROCEDURE H&P NOTE   Primary Care Physician: Martinique, Betty G, MD  HPI: [name] is a 57 y.o. female who presents for Colonoscopy for high risk screening with FHx of Colon Cancer Brother < age 43.  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    Lower extremities   Anemia    past hx    Arthritis    KNEE,ANKLE   Asthma    childhood   Hypothyroidism    IBS (irritable bowel syndrome)    Multinodular goiter    s/p radioactive I131 ablation, Iatogenic Hypothyroidism   Obesity    Personal history of goiter    with radioactive iodine ablation   Past Surgical History:  Procedure Laterality Date   BREAST CYST EXCISION Right 2012   BREAST SURGERY     biopsy, right   COLONOSCOPY     WISDOM TOOTH EXTRACTION     Current Outpatient Medications  Medication Sig Dispense Refill   acetaminophen (TYLENOL) 500 MG tablet Take 500 mg by mouth every 6 (six) hours as needed for mild pain, fever or headache.     gabapentin (NEURONTIN) 100 MG capsule Take 1 capsule (100 mg total) by mouth 3 (three) times daily. 90 capsule 3   ibuprofen (ADVIL) 200 MG tablet Take by mouth as needed.     levothyroxine (SYNTHROID) 137 MCG tablet TAKE 1 TABLET BY MOUTH TWICE DAILY ON WEDNESDAY, THURSDAY AND Friday and ONCE DAILY ON MONDAY, THURSDAY, SATURDAY AND SUNDAY 120 tablet 3   lidocaine (XYLOCAINE) 5 % ointment Apply 1 Application topically as needed. 35.44 g 0   lidocaine (XYLOCAINE) 5 % ointment Apply 1 Application topically as needed. 35.44 g 0   meloxicam (MOBIC) 15 MG tablet TAKE 1 TABLET(15 MG) BY MOUTH DAILY (Patient not taking: Reported on 02/10/2022) 30 tablet 3   naproxen sodium (ALEVE) 220 MG tablet Take by mouth as needed.     VITAMIN D PO Take 1 capsule by mouth daily.     VITAMIN E PO Take 1 tablet by mouth daily.     No current facility-administered medications for this visit.    Current Outpatient Medications:     acetaminophen (TYLENOL) 500 MG tablet, Take 500 mg by mouth every 6 (six) hours as needed for mild pain, fever or headache., Disp: , Rfl:    gabapentin (NEURONTIN) 100 MG capsule, Take 1 capsule (100 mg total) by mouth 3 (three) times daily., Disp: 90 capsule, Rfl: 3   ibuprofen (ADVIL) 200 MG tablet, Take by mouth as needed., Disp: , Rfl:    levothyroxine (SYNTHROID) 137 MCG tablet, TAKE 1 TABLET BY MOUTH TWICE DAILY ON WEDNESDAY, THURSDAY AND Friday and ONCE DAILY ON MONDAY, THURSDAY, SATURDAY AND SUNDAY, Disp: 120 tablet, Rfl: 3   lidocaine (XYLOCAINE) 5 % ointment, Apply 1 Application topically as needed., Disp: 35.44 g, Rfl: 0   lidocaine (XYLOCAINE) 5 % ointment, Apply 1 Application topically as needed., Disp: 35.44 g, Rfl: 0   meloxicam (MOBIC) 15 MG tablet, TAKE 1 TABLET(15 MG) BY MOUTH DAILY (Patient not taking: Reported on 02/10/2022), Disp: 30 tablet, Rfl: 3   naproxen sodium (ALEVE) 220 MG tablet, Take by mouth as needed., Disp: , Rfl:    VITAMIN D PO, Take 1 capsule by mouth daily., Disp: , Rfl:    VITAMIN E PO, Take 1 tablet by mouth daily., Disp: , Rfl:  Allergies  Allergen Reactions   Sulfamethoxazole-Trimethoprim Hives   Sulfonamide  Derivatives Hives   Family History  Problem Relation Age of Onset   Hypertension Mother    Diabetes Mother    Heart disease Mother    Kidney failure Father    Prostate cancer Father    Heart disease Sister    Sarcoidosis Brother    Colon cancer Brother    Pancreatic cancer Brother    Breast cancer Maternal Aunt    Breast cancer Maternal Aunt    Breast cancer Cousin    Colon polyps Neg Hx    Crohn's disease Neg Hx    Esophageal cancer Neg Hx    Rectal cancer Neg Hx    Stomach cancer Neg Hx    Ulcerative colitis Neg Hx    Social History   Socioeconomic History   Marital status: Single    Spouse name: Not on file   Number of children: 0   Years of education: 16   Highest education level: Not on file  Occupational History    Occupation: CLINICAL CHEMIST    Employer: [employer]  Tobacco Use   Smoking status: Never   Smokeless tobacco: Never  Vaping Use   Vaping Use: Never used  Substance and Sexual Activity   Alcohol use: No   Drug use: No   Sexual activity: Not Currently    Birth control/protection: None  Other Topics Concern   Not on file  Social History Narrative   Not on file   Social Determinants of Health   Financial Resource Strain: Not on file  Food Insecurity: Not on file  Transportation Needs: Not on file  Physical Activity: Not on file  Stress: Not on file  Social Connections: Not on file  Intimate Partner Violence: Not on file    Physical Exam: There were no vitals filed for this visit. There is no height or weight on file to calculate BMI. GEN: NAD EYE: Sclerae anicteric ENT: MMM CV: Non-tachycardic GI: Soft, NT/ND NEURO:  Alert & Oriented x 3  Lab Results: No results for input(s): "WBC", "HGB", "HCT", "PLT" in the last 72 hours. BMET No results for input(s): "NA", "K", "CL", "CO2", "GLUCOSE", "BUN", "CREATININE", "CALCIUM" in the last 72 hours. LFT No results for input(s): "PROT", "ALBUMIN", "AST", "ALT", "ALKPHOS", "BILITOT", "BILIDIR", "IBILI" in the last 72 hours. PT/INR No results for input(s): "LABPROT", "INR" in the last 72 hours.   Impression / Plan: This is a 58 y.o.female who presents for Colonoscopy for high risk screening with FHx of Colon Cancer Brother < age 13.  The risks and benefits of endoscopic evaluation/treatment were discussed with the patient and/or family; these include but are not limited to the risk of perforation, infection, bleeding, missed lesions, lack of diagnosis, severe illness requiring hospitalization, as well as anesthesia and sedation related illnesses.  The patient's history has been reviewed, patient examined, no change in status, and deemed stable for procedure.  The patient and/or family is agreeable to proceed.    Justice Britain, MD [facility] Office # [phone]

## 2022-03-03 NOTE — Progress Notes (Signed)
Cell phone off per pt Pt's states no medical or surgical changes since previsit or office visit.  

## 2022-03-04 ENCOUNTER — Telehealth: Payer: Self-pay

## 2022-03-04 NOTE — Telephone Encounter (Signed)
  Follow up Call-     03/03/2022    1:23 PM  Call back number  Post procedure Call Back phone  # [phone]  Permission to leave phone message Yes     Patient questions:  Do you have a fever, pain , or abdominal swelling? No. Pain Score  0 *  Have you tolerated food without any problems? Yes.    Have you been able to return to your normal activities? Yes.    Do you have any questions about your discharge instructions: Diet   No. Medications  No. Follow up visit  No.  Do you have questions or concerns about your Care? No.  Actions: * If pain score is 4 or above: No action needed, pain <4.

## 2022-04-16 ENCOUNTER — Telehealth: Payer: Self-pay | Admitting: Family Medicine

## 2022-04-16 NOTE — Telephone Encounter (Signed)
Needs handicap placard, has pain in legs due to thyroid issues. Pls call patient to advise

## 2022-04-17 ENCOUNTER — Telehealth (INDEPENDENT_AMBULATORY_CARE_PROVIDER_SITE_OTHER): Payer: 59 | Admitting: Family Medicine

## 2022-04-17 ENCOUNTER — Encounter: Payer: Self-pay | Admitting: Family Medicine

## 2022-04-17 VITALS — Ht 67.0 in

## 2022-04-17 DIAGNOSIS — G8929 Other chronic pain: Secondary | ICD-10-CM

## 2022-04-17 DIAGNOSIS — M25572 Pain in left ankle and joints of left foot: Secondary | ICD-10-CM

## 2022-04-17 NOTE — Progress Notes (Signed)
Virtual Visit via Video Note I connected with [name] on 04/17/22 by a video enabled telemedicine application and verified that I am speaking with the correct person using two identifiers. Location patient: In her car. Location provider:work office Persons participating in the virtual visit: patient, provider  I discussed the limitations of evaluation and management by telemedicine and the availability of in person appointments. The patient expressed understanding and agreed to proceed.  Chief Complaint  Patient presents with   Form Completion   HPI: [name] is a 57 yo female with PMHx significant for hypothyroidism, chronic pain, and BMI 43 requesting a handicap sticker.  Left distal fibular fracture in 02/06/2020, s/p ORIF 02/15/20. She is having medial ankle soreness,mild,and constant.  Her orthopedist has been providing her handicap sticker but she does not feel like she needs to see him, pain has been stable and when she does she gets a bill that she has difficulty paying.  Still having left ankle pain, today pain is 7/10, exacerbated by walking during rainy days. Intermittent peri ankle edema. Pain is exacerbated by prolonged walking and standing, alleviated by rest. She has not noted erythema. No significant limitation of movement.  She takes Tylenol, ibuprofen, and meloxicam.  ROS: See pertinent positives and negatives per HPI.  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    Lower extremities   Anemia    past hx    Arthritis    KNEE,ANKLE   Asthma    childhood   Hypothyroidism    IBS (irritable bowel syndrome)    Multinodular goiter    s/p radioactive I131 ablation, Iatogenic Hypothyroidism   Obesity    Personal history of goiter    with radioactive iodine ablation   Past Surgical History:  Procedure Laterality Date   BREAST CYST EXCISION Right 2012   BREAST SURGERY     biopsy, right    COLONOSCOPY     WISDOM TOOTH EXTRACTION     Family History  Problem Relation Age of Onset   Hypertension Mother    Diabetes Mother    Heart disease Mother    Kidney failure Father    Prostate cancer Father    Heart disease Sister    Sarcoidosis Brother    Colon cancer Brother    Pancreatic cancer Brother    Breast cancer Maternal Aunt    Breast cancer Maternal Aunt    Breast cancer Cousin    Colon polyps Neg Hx    Crohn's disease Neg Hx    Esophageal cancer Neg Hx    Rectal cancer Neg Hx    Stomach cancer Neg Hx    Ulcerative colitis Neg Hx    Social History   Socioeconomic History   Marital status: Single    Spouse name: Not on file   Number of children: 0   Years of education: 16   Highest education level: Not on file  Occupational History   Occupation: CLINICAL CHEMIST    Employer: [employer]  Tobacco Use   Smoking status: Never   Smokeless tobacco: Never  Vaping Use   Vaping Use: Never used  Substance and Sexual Activity   Alcohol use: No   Drug use: No   Sexual activity: Not Currently    Birth control/protection: None  Other Topics Concern   Not on file  Social History Narrative   Not on file   Social Determinants of Health   Financial Resource Strain:  Not on file  Food Insecurity: Not on file  Transportation Needs: Not on file  Physical Activity: Not on file  Stress: Not on file  Social Connections: Not on file  Intimate Partner Violence: Not on file   Current Outpatient Medications:    acetaminophen (TYLENOL) 500 MG tablet, Take 500 mg by mouth every 6 (six) hours as needed for mild pain, fever or headache., Disp: , Rfl:    gabapentin (NEURONTIN) 100 MG capsule, Take 1 capsule (100 mg total) by mouth 3 (three) times daily., Disp: 90 capsule, Rfl: 3   ibuprofen (ADVIL) 200 MG tablet, Take by mouth as needed., Disp: , Rfl:    levothyroxine (SYNTHROID) 137 MCG tablet, TAKE 1 TABLET BY MOUTH TWICE DAILY ON WEDNESDAY, THURSDAY AND Friday and ONCE DAILY  ON MONDAY, THURSDAY, SATURDAY AND SUNDAY, Disp: 120 tablet, Rfl: 3   lidocaine (XYLOCAINE) 5 % ointment, Apply 1 Application topically as needed., Disp: 35.44 g, Rfl: 0   NONFORMULARY OR COMPOUNDED ITEM, Aspercream- use as needed, Disp: , Rfl:    VITAMIN D PO, Take 1 capsule by mouth daily., Disp: , Rfl:    VITAMIN E PO, Take 1 tablet by mouth daily., Disp: , Rfl:   EXAM:  VITALS per patient if applicable:Ht 5\' 7"  (1.702 m)   BMI 43.54 kg/m   GENERAL: alert, oriented, appears well and in no acute distress  HEENT: atraumatic, conjunctiva clear, no obvious abnormalities on inspection.  LUNGS: on inspection no signs of respiratory distress, breathing rate appears normal, no obvious gross SOB, gasping or wheezing  CV: no obvious cyanosis  PSYCH/NEURO: pleasant and cooperative, no obvious depression or anxiety, speech and thought processing grossly intact  ASSESSMENT AND PLAN:  Discussed the following assessment and plan:  Chronic pain of left ankle Instructed to avoid taking different NSAIDs at the time, she needs to choose one, OTC ibuprofen or Aleve, we discussed some side effects. This is a chronic problem, aggravated by prolonged walking, handicap sticker will be provided but encouraged to work when pain is not as severe.  Wt loss will help with problem progression.  We discussed possible serious and likely etiologies, options for evaluation and workup, limitations of telemedicine visit vs in person visit, treatment, treatment risks and precautions. The patient was advised to call back or seek an in-person evaluation if the symptoms worsen or if the condition fails to improve as anticipated. I discussed the assessment and treatment plan with the patient. The patient was provided an opportunity to ask questions and all were answered. The patient agreed with the plan and demonstrated an understanding of the instructions.  Return if symptoms worsen or fail to improve, for keep next  appointment.  [name] G. Martinique, MD  [facility]. [place] office.

## 2022-04-17 NOTE — Telephone Encounter (Signed)
I spoke with patient. Appointment arranged for this afternoon at 4:30.

## 2022-07-10 ENCOUNTER — Telehealth: Payer: Self-pay | Admitting: Family Medicine

## 2022-07-10 NOTE — Telephone Encounter (Signed)
Patient dropped off document  Medical Evaluation , to be filled out by provider. Patient requested to send it via Mail within 7-days. Document is located in providers tray at front office.Please advise at Mobile [phone] (mobile)

## 2022-07-13 NOTE — Telephone Encounter (Signed)
On PCP's desk to be signed.

## 2022-07-14 NOTE — Telephone Encounter (Signed)
Form completed, copy sent to scan and original placed in envelope to be mailed out to patient.

## 2022-08-31 ENCOUNTER — Other Ambulatory Visit: Payer: Self-pay

## 2022-08-31 ENCOUNTER — Other Ambulatory Visit: Payer: Self-pay | Admitting: Family Medicine

## 2022-08-31 DIAGNOSIS — E032 Hypothyroidism due to medicaments and other exogenous substances: Secondary | ICD-10-CM

## 2022-08-31 MED ORDER — LEVOTHYROXINE SODIUM 137 MCG PO TABS: ORAL_TABLET | ORAL | 0 refills | Status: DC

## 2022-09-01 ENCOUNTER — Other Ambulatory Visit: Payer: Self-pay | Admitting: Family Medicine

## 2022-09-01 DIAGNOSIS — E032 Hypothyroidism due to medicaments and other exogenous substances: Secondary | ICD-10-CM

## 2022-09-17 ENCOUNTER — Other Ambulatory Visit: Payer: Self-pay | Admitting: Family Medicine

## 2022-09-17 DIAGNOSIS — Z1231 Encounter for screening mammogram for malignant neoplasm of breast: Secondary | ICD-10-CM

## 2022-10-07 ENCOUNTER — Ambulatory Visit
Admission: RE | Admit: 2022-10-07 | Discharge: 2022-10-07 | Disposition: A | Discharge status: Home / Self Care | Payer: 59 | Source: Ambulatory Visit | Attending: Family Medicine | Admitting: Family Medicine

## 2022-10-07 DIAGNOSIS — Z1231 Encounter for screening mammogram for malignant neoplasm of breast: Secondary | ICD-10-CM

## 2022-10-29 IMAGING — CT CT KNEE*L* W/O CM
3 of 5 series · 15 of 33 positions shown, 18 images · non-contrast
Comparison: X-ray 02/06/2020

CLINICAL DATA: Left knee pain after twisting injury

EXAM:
CT OF THE LEFT KNEE WITHOUT CONTRAST
TECHNIQUE: Multidetector CT imaging of the left knee was performed according to
the standard protocol. Multiplanar CT image reconstructions were
also generated.

[Series 4: axial st · axial · 0.47mm/px · z∈[+784,+962]mm · 10 of 107 slices shown, 13 images]
[im 9/107  soft-tissue]
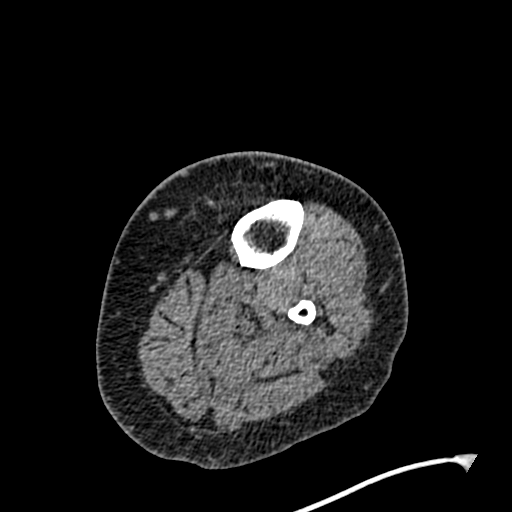
[im 9/107  bone]
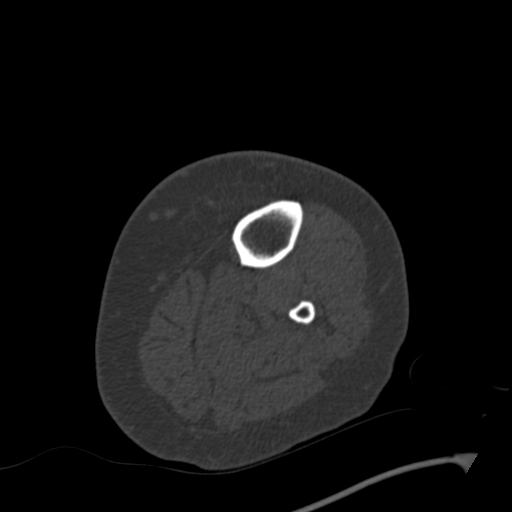
[im 17/107  bone]
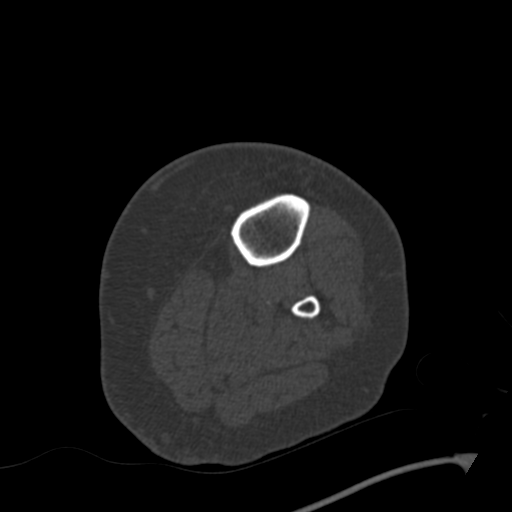
[im 33/107  bone]
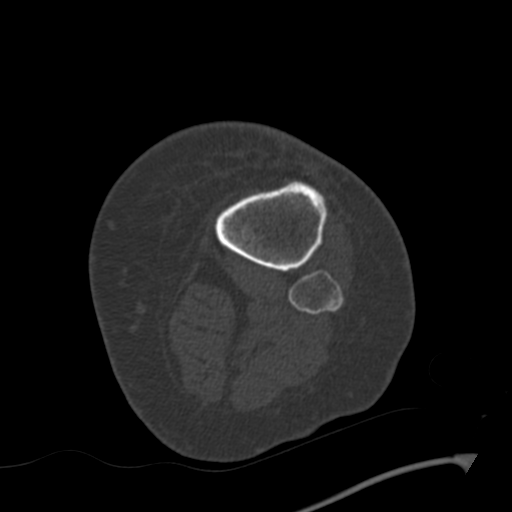
[im 41/107  bone]
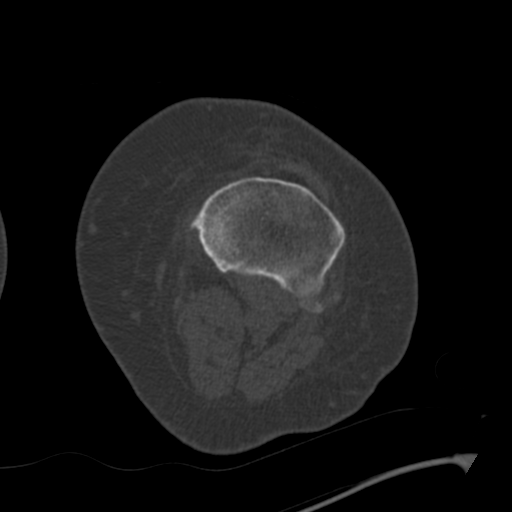
[im 49/107  soft-tissue]
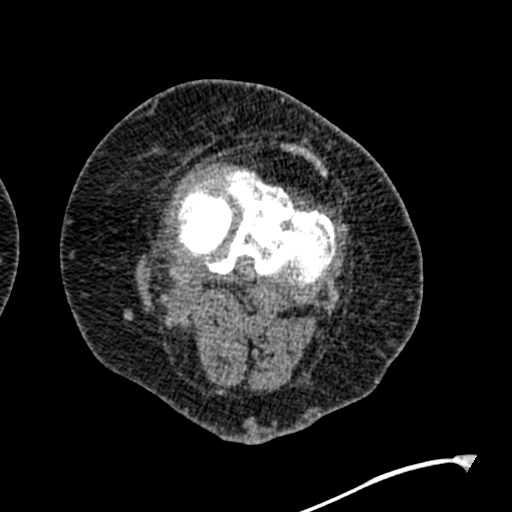
[im 49/107  bone]
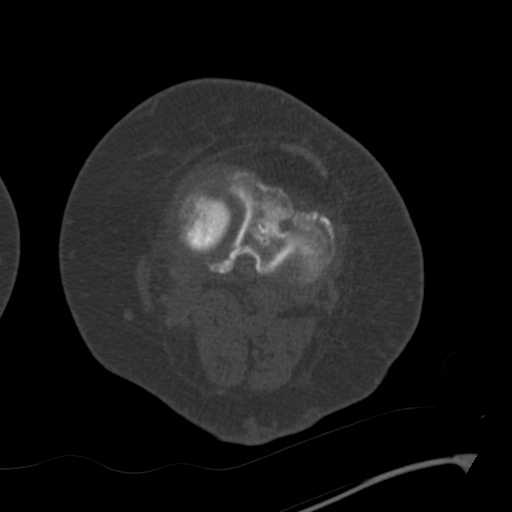
[im 58/107  bone]
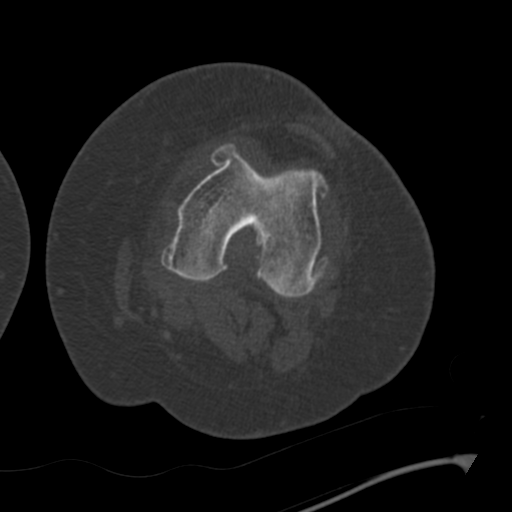
[im 66/107  bone]
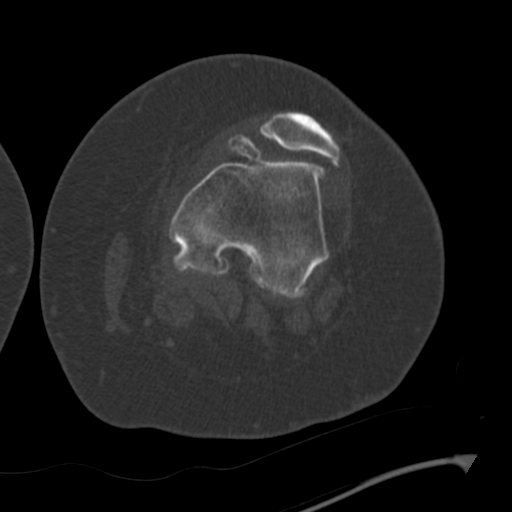
[im 82/107  bone]
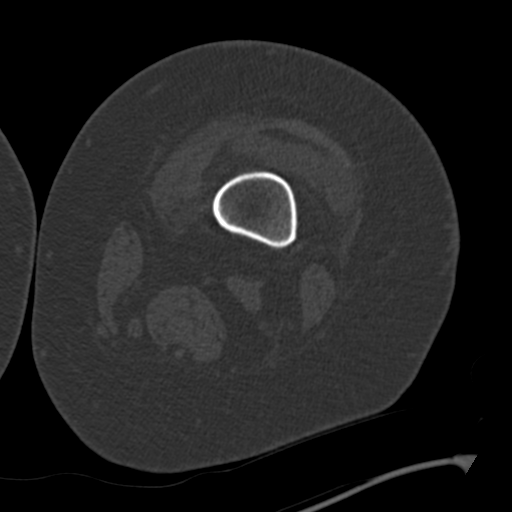
[im 90/107  soft-tissue]
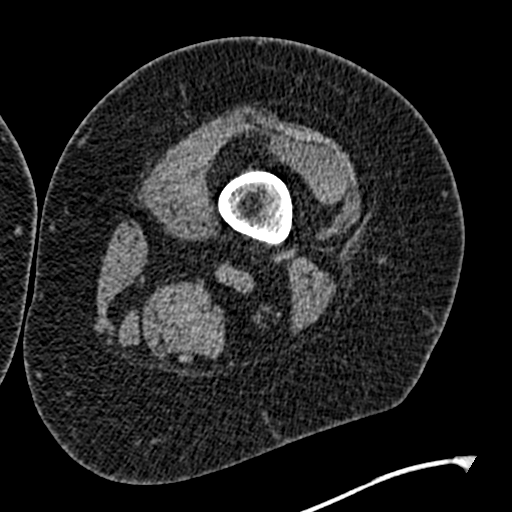
[im 90/107  bone]
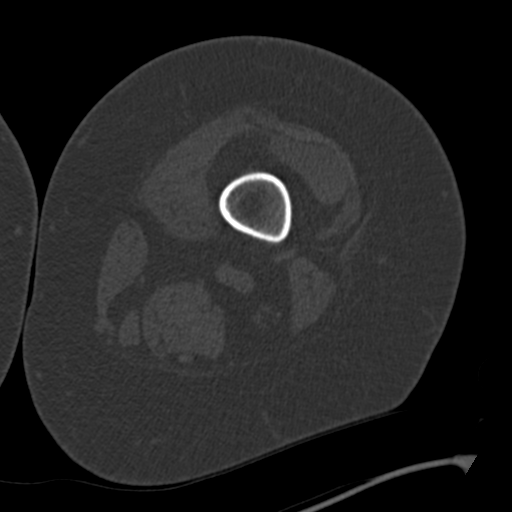
[im 98/107  bone]
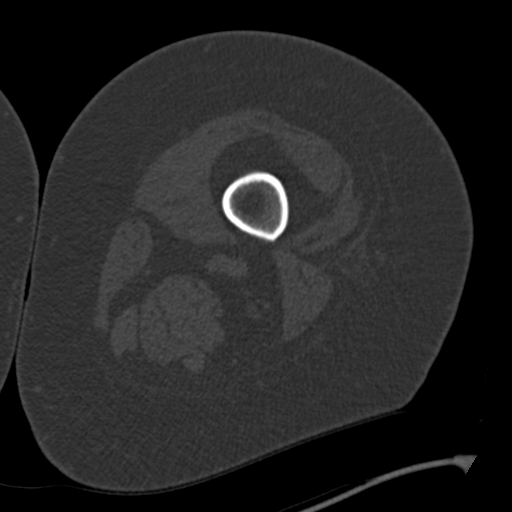

[Series 7: coronal bone · coronal · 0.46mm/px · 1 of 102 slices shown]
[im 51/102  bone]
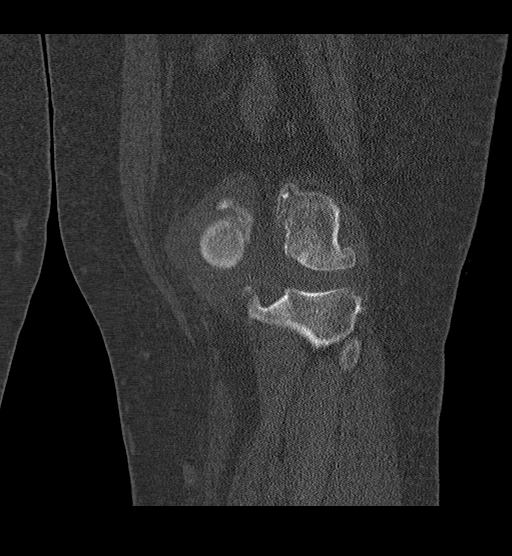

[Series 8: sagittal bone · sagittal · 0.42mm/px · 4 of 103 slices shown]
[im 18/103  bone]
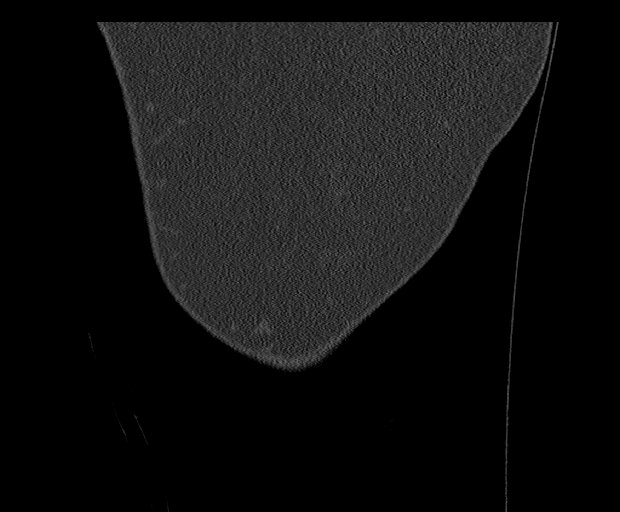
[im 35/103  bone]
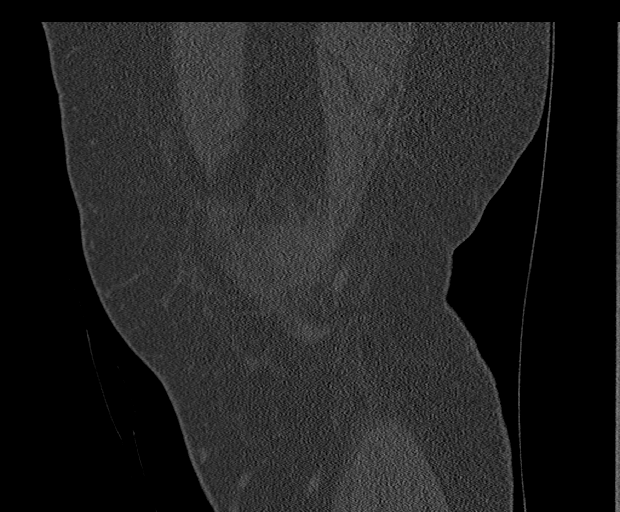
[im 52/103  bone]
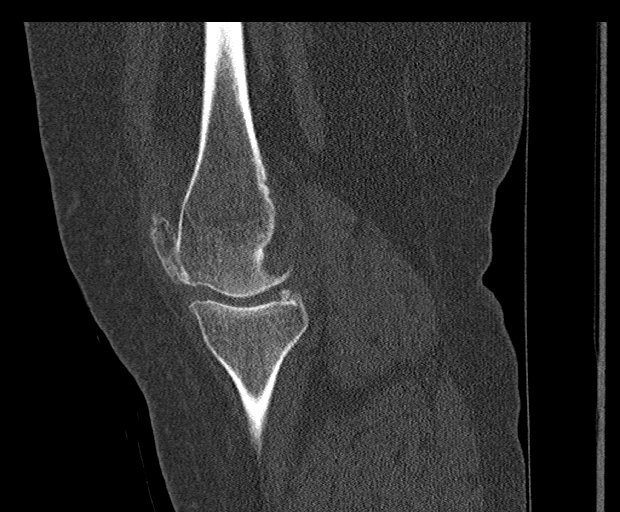
[im 69/103  bone]
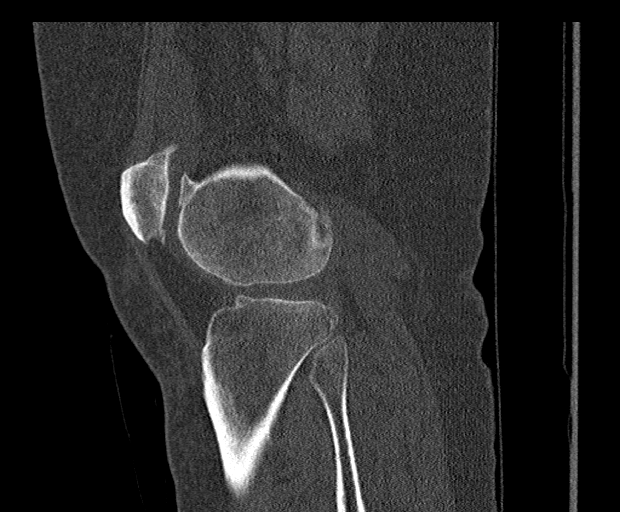

[15 of 33 positions shown; findings below may reference images not displayed]

FINDINGS: Bones/Joint/Cartilage

No acute fracture. No dislocation. Moderate to severe
tricompartmental osteoarthritis of the left knee manifested by joint
space narrowing, subchondral sclerosis/cystic change, and large
marginal osteophyte formation. Findings are most severe within the
medial compartment. Small to moderate-sized knee joint effusion
without fat-fluid or hematocrit level. Small Baker's cyst.

Ligaments

Suboptimally assessed by CT.

Muscles and Tendons

No acute musculotendinous injury by CT.

Soft tissues

Small volume prepatellar fluid. Mild subcutaneous edema most
pronounced anteriorly.
IMPRESSION: 1. No acute osseous abnormality of the left knee.
2. Moderate-to-severe tricompartmental osteoarthritis of the left
knee, most severe within the medial compartment.
3. Small to moderate-sized knee joint effusion without evidence of
lipohemarthrosis. Small Baker's cyst.
4. Small volume prepatellar fluid, which could reflect a mild
bursitis.

## 2022-10-29 IMAGING — CR DG KNEE COMPLETE 4+V*L*
4 series · 4 of 4 positions shown · non-contrast
Comparison: No recent.

CLINICAL DATA: Fall.  Pain.

EXAM:
LEFT KNEE - COMPLETE 4+ VIEW

[t knee ap left]
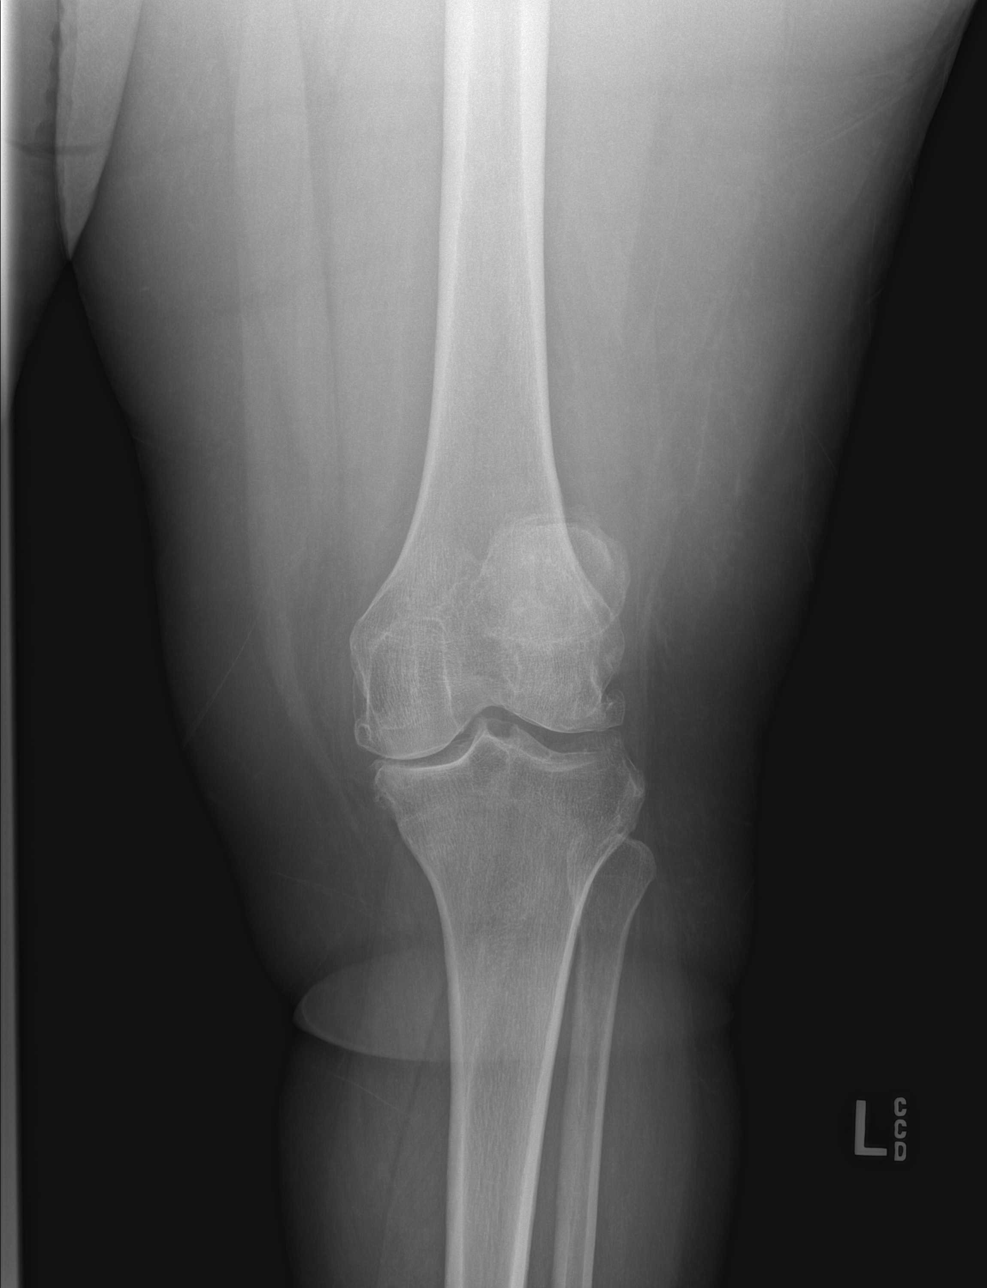

[t knee obl left (1 of 2)]
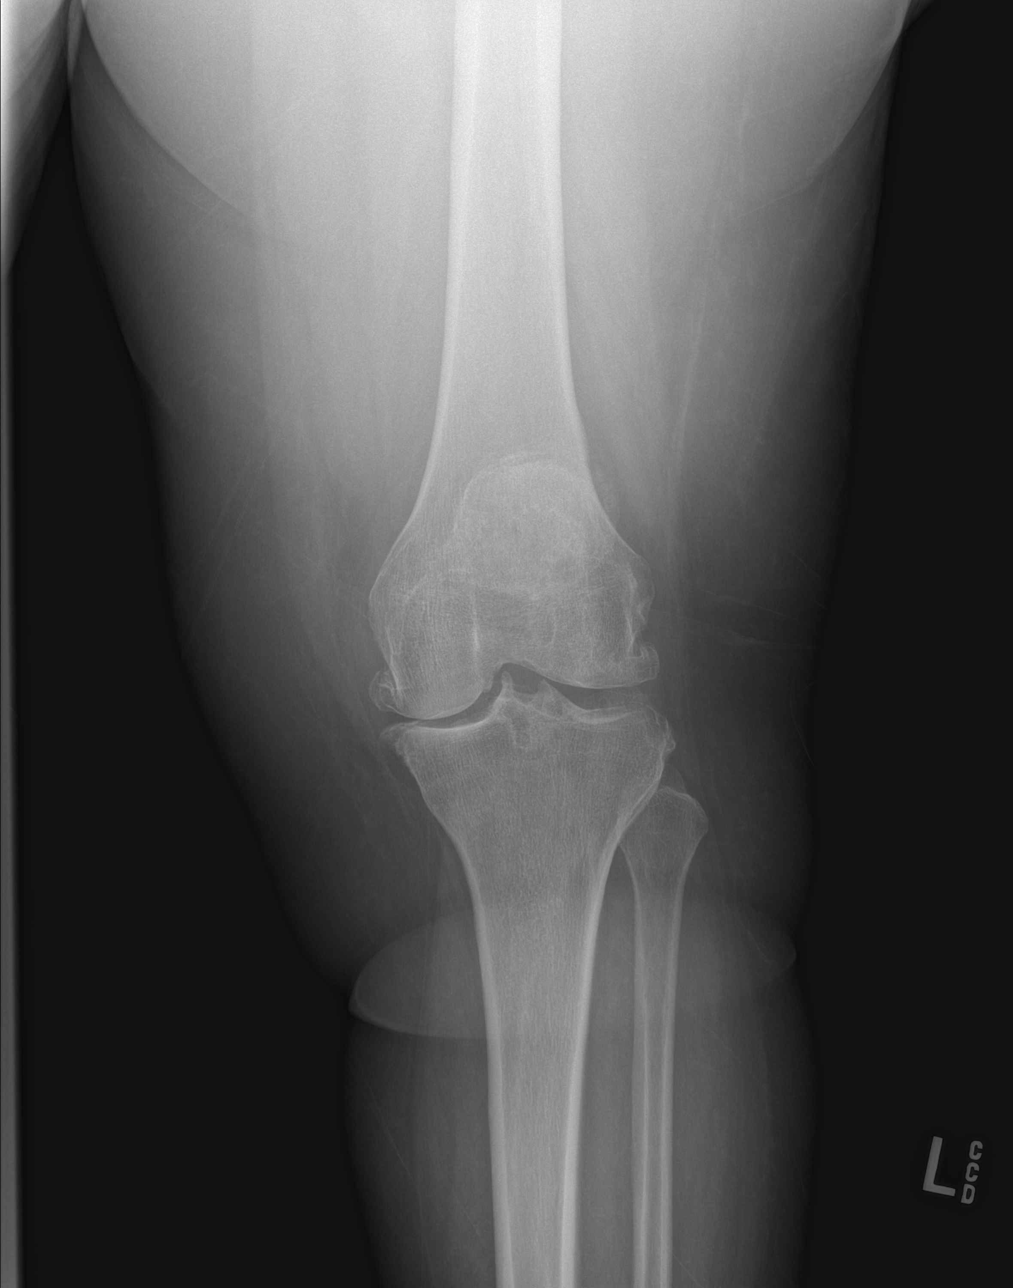

[t knee obl left (2 of 2)]
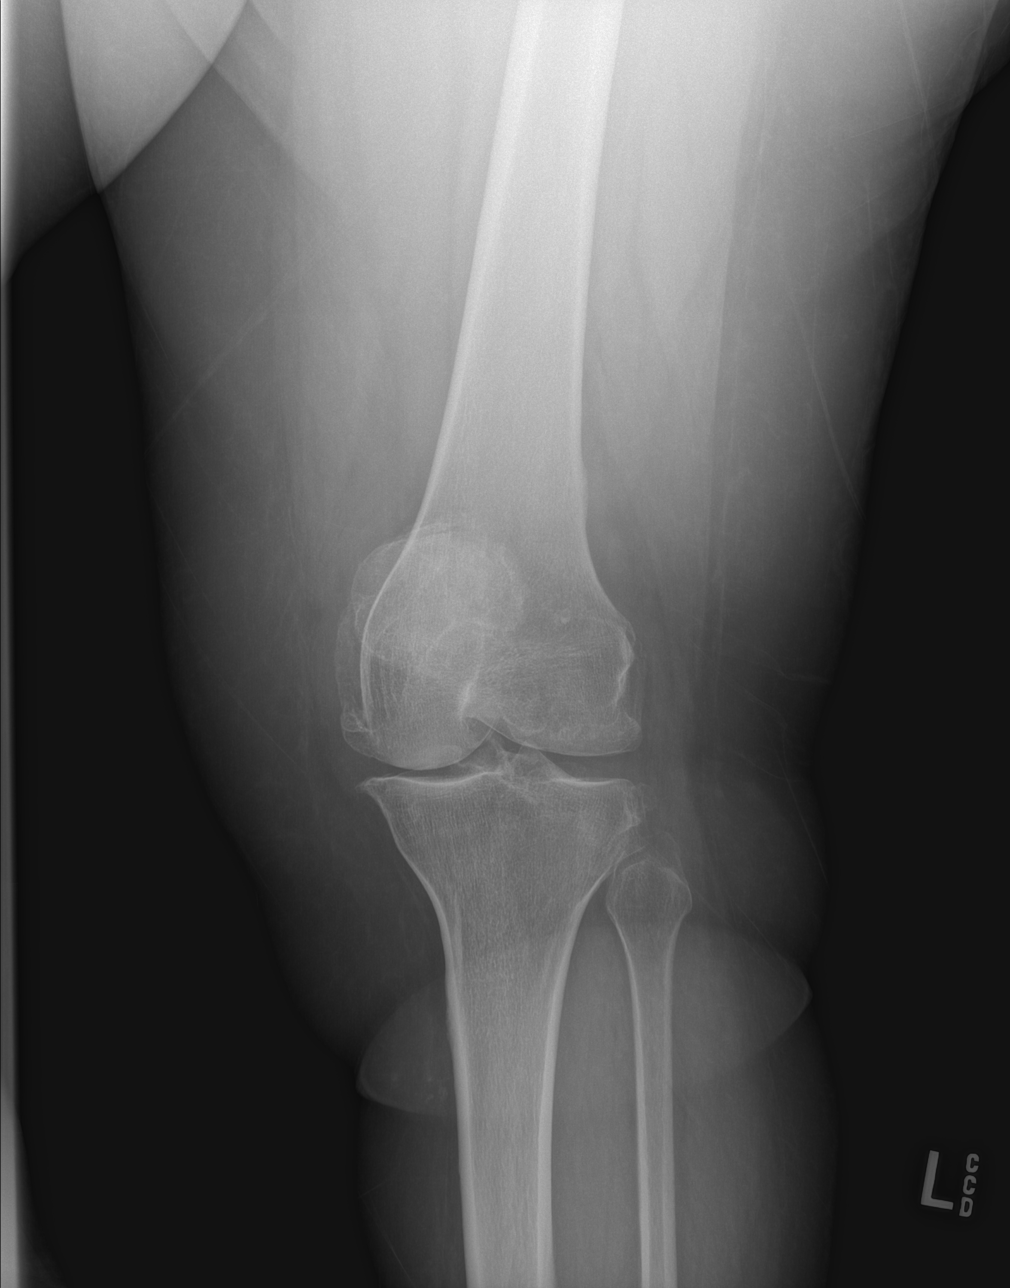

[t knee lat left]
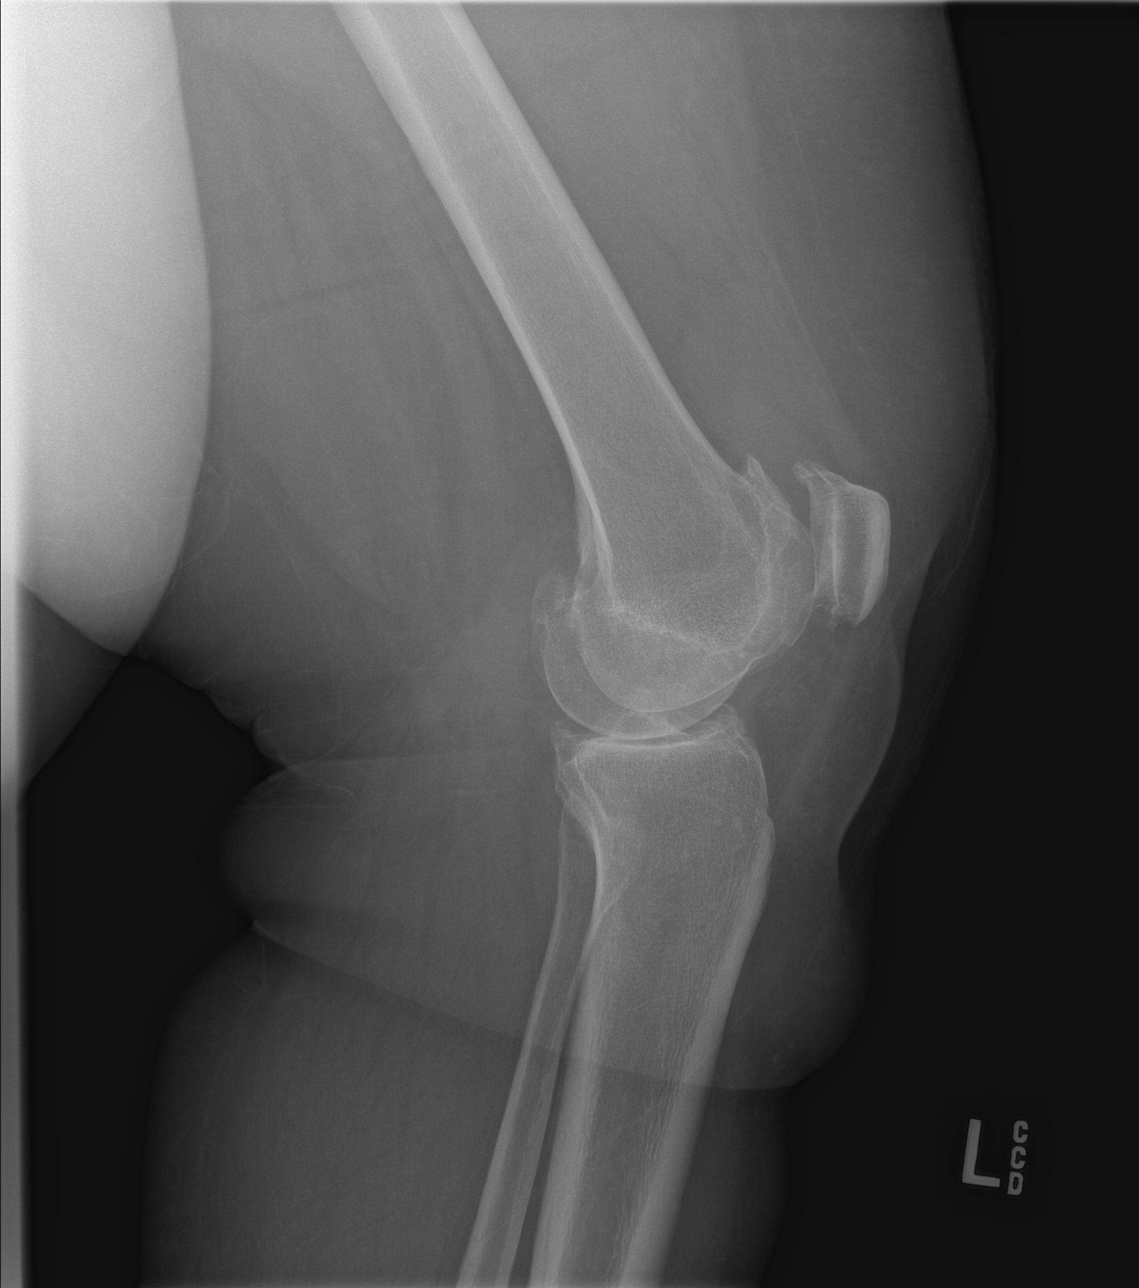

[4 of 4 positions shown; findings below may reference images not displayed]

FINDINGS: Diffuse osteopenia and tricompartment degenerative change. Very
subtle nondisplaced fracture of the medial most aspect of the medial
tibial plateau cannot be completely excluded. No evidence of
dislocation. Knee joint effusion most likely present.
IMPRESSION: Diffuse osteopenia and tricompartment degenerative change. Very
subtle nondisplaced fracture of the medial most aspect of the medial
tibial plateau cannot be completely excluded. Knee joint effusion
most likely present.

## 2022-11-03 ENCOUNTER — Encounter: Payer: Self-pay | Admitting: Family Medicine

## 2022-11-03 ENCOUNTER — Ambulatory Visit (INDEPENDENT_AMBULATORY_CARE_PROVIDER_SITE_OTHER): Payer: 59 | Admitting: Family Medicine

## 2022-11-03 VITALS — BP 120/80 | HR 100 | Resp 12 | Ht 67.0 in | Wt 284.2 lb

## 2022-11-03 DIAGNOSIS — Z23 Encounter for immunization: Secondary | ICD-10-CM | POA: Diagnosis not present

## 2022-11-03 DIAGNOSIS — M25572 Pain in left ankle and joints of left foot: Secondary | ICD-10-CM

## 2022-11-03 DIAGNOSIS — E032 Hypothyroidism due to medicaments and other exogenous substances: Secondary | ICD-10-CM

## 2022-11-03 DIAGNOSIS — E785 Hyperlipidemia, unspecified: Secondary | ICD-10-CM

## 2022-11-03 DIAGNOSIS — R7303 Prediabetes: Secondary | ICD-10-CM | POA: Diagnosis not present

## 2022-11-03 DIAGNOSIS — G8929 Other chronic pain: Secondary | ICD-10-CM

## 2022-11-03 NOTE — Assessment & Plan Note (Signed)
Gained about 2-3 Lb sine 10/2021. She understands the benefits of wt loss as well as adverse effects of obesity. Consistency with healthy diet and physical activity encouraged.

## 2022-11-03 NOTE — Assessment & Plan Note (Signed)
Hx of left distal fibular fracture in 02/06/2020, s/p ORIF 02/15/20 complicated with slow healing, she followed with wound clinic for a few months and chronic pain since. Handicap completed today. Wt loss may help. She can try taking Naproxen more consistently for pain management, 220 mg daily and extra one if needed. We discussed possible side effects of chronic NSAID's use. Instructed to take medication with food.

## 2022-11-03 NOTE — Assessment & Plan Note (Signed)
S/P radioactive iodine treatment Continue levothyroxine 137 mcg daily 2 tablets Wednesday, Thursdays, and Fridays and 1 tablet the rest of the week.  She will try to be more consistent taking medication weekends.

## 2022-11-03 NOTE — Progress Notes (Unsigned)
ACUTE VISIT Chief Complaint  Patient presents with   Form Completion   HPI: [name] is a 57 y.o. female with a PMHx significant for hypothyroidism, chronic pain, prediabetes, and obesity here today for a handicap application.   Last seen on video visit on 04/17/2022.  Last seen in person on 10/24/2021  She states she needs a handicap form because it is challenging for her to walk at work due to swelling in her ankle.  Parking spot is far from entrance.  She says the ankle soreness and edema in the morning, and worsens when she walks and with prolonged standing at work. No erythema or calf pain. Acetaminophen does not help much. Took Meloxican in the past but does not feel it is better than Naproxen OTC, which she has also taken. Left distal fibular fracture in 02/06/2020,felt on ice, s/p ORIF 02/15/20.   Peripheral neuropathy, not longer taking Gabapentin.  Exercise: She has been using a "seated elliptical" to exercise 3x/week for the last month.  Diet: She reports she is not eating very much junk food.  She has gained 2 pounds since her last visit.   Prediabetes: Negative for polyuria, polydipsia, polyphagia.  Lab Results  Component Value Date   HGBA1C 5.7 (H) 03/05/2021   HLD: Currently she is on nonpharmacologic treatment. Lab Results  Component Value Date   CHOL 201 (H) 03/05/2021   HDL 55 03/05/2021   LDLCALC 134 (H) 03/05/2021   TRIG 65 03/05/2021   CHOLHDL 3.7 03/05/2021   Hypothyroidism:S/P radioactive iodine treatment for hyperthyroidism.  She is taking Synthroid 137 mcg 2 tablets Wednesday, Thursdays, and Fridays and 1 tablet the rest of the week.  She occasionally forgets to take it on weekends when she is not in her workday routine.   Lab Results  Component Value Date   TSH 2.000 03/05/2021   Review of Systems  Constitutional:  Negative for activity change, appetite change, chills and fever.  HENT:  Negative for nosebleeds and sore throat.    Eyes:  Negative for redness and visual disturbance.  Respiratory:  Negative for cough, shortness of breath and wheezing.   Cardiovascular:  Negative for chest pain and palpitations.  Gastrointestinal:  Negative for abdominal pain, nausea and vomiting.       Negative for changes in bowel habits.  Endocrine: Negative for cold intolerance and heat intolerance.  Genitourinary:  Negative for decreased urine volume, difficulty urinating, dysuria and hematuria.  Musculoskeletal:  Positive for arthralgias.  Skin:  Negative for rash.  Neurological:  Negative for syncope, weakness and headaches.  Psychiatric/Behavioral:  Negative for confusion.   See other pertinent positives and negatives in HPI.  Current Outpatient Medications on File Prior to Visit  Medication Sig Dispense Refill   acetaminophen (TYLENOL) 500 MG tablet Take 500 mg by mouth every 6 (six) hours as needed for mild pain, fever or headache.     gabapentin (NEURONTIN) 100 MG capsule Take 1 capsule (100 mg total) by mouth 3 (three) times daily. 90 capsule 3   ibuprofen (ADVIL) 200 MG tablet Take by mouth as needed.     levothyroxine (SYNTHROID) 137 MCG tablet TAKE 1 TABLET BY MOUTH TWICE DAILY ON WEDNESDAY, THURSDAY, AND FRIDAY, AND 1 TABLET DAILY ALL OTHER DAYS OF THE WEEK 128 tablet 0   lidocaine (XYLOCAINE) 5 % ointment Apply 1 Application topically as needed. 35.44 g 0   NONFORMULARY OR COMPOUNDED ITEM Aspercream- use as needed     VITAMIN D PO Take 1  capsule by mouth daily.     VITAMIN E PO Take 1 tablet by mouth daily.     No current facility-administered medications on file prior to visit.   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    Lower extremities   Anemia    past hx    Arthritis    KNEE,ANKLE   Asthma    childhood   Hypothyroidism    IBS (irritable bowel syndrome)    Multinodular goiter    s/p radioactive I131 ablation, Iatogenic Hypothyroidism    Obesity    Personal history of goiter    with radioactive iodine ablation   Allergies  Allergen Reactions   Sulfamethoxazole-Trimethoprim Hives   Sulfonamide Derivatives Hives    Social History   Socioeconomic History   Marital status: Single    Spouse name: Not on file   Number of children: 0   Years of education: 16   Highest education level: Not on file  Occupational History   Occupation: CLINICAL CHEMIST    Employer: [employer]  Tobacco Use   Smoking status: Never   Smokeless tobacco: Never  Vaping Use   Vaping status: Never Used  Substance and Sexual Activity   Alcohol use: No   Drug use: No   Sexual activity: Not Currently    Birth control/protection: None  Other Topics Concern   Not on file  Social History Narrative   Not on file   Social Determinants of Health   Financial Resource Strain: Not on file  Food Insecurity: Not on file  Transportation Needs: Not on file  Physical Activity: Not on file  Stress: Not on file  Social Connections: Not on file   Vitals:   11/03/22 1554  BP: 120/80  Pulse: 100  Resp: 12  SpO2: 99%   Body mass index is 44.52 kg/m.  Physical Exam Vitals and nursing note reviewed. Exam conducted with a chaperone present.  Constitutional:      General: She is not in acute distress.    Appearance: She is well-developed. She is not ill-appearing.  HENT:     Head: Normocephalic and atraumatic.     Mouth/Throat:     Mouth: Mucous membranes are moist.  Eyes:     Conjunctiva/sclera: Conjunctivae normal.  Neck:     Thyroid: No thyroid mass.  Cardiovascular:     Rate and Rhythm: Normal rate and regular rhythm.     Pulses:          Dorsalis pedis pulses are 2+ on the right side and 2+ on the left side.     Heart sounds: No murmur heard.    Comments: Right LE trace pitting edema. Pulmonary:     Effort: Pulmonary effort is normal. No respiratory distress.     Breath sounds: Normal breath sounds.  Abdominal:     General: Bowel  sounds are normal.     Palpations: Abdomen is soft. There is no mass.     Tenderness: There is no abdominal tenderness.  Genitourinary:    Rectum: Guaiac result negative.     Comments: Hard stool in rectal voult. No masses or ulcers appreciated upon palpation. Musculoskeletal:     Left lower leg: 1+ Edema present.  Lymphadenopathy:     Cervical: No cervical adenopathy.  Skin:    General: Skin is warm.     Findings: No erythema or rash.  Neurological:     General: No focal deficit  present.     Mental Status: She is alert and oriented to person, place, and time.     Comments: Antalgic gait with limping, not assisted.  Psychiatric:        Mood and Affect: Mood and affect normal.   ASSESSMENT AND PLAN:  [name] was seen today for a handicap application.  Orders Placed This Encounter  Procedures   Flu vaccine trivalent PF, 6mos and older(Flulaval,Afluria,Fluarix,Fluzone)   Lipid panel   TSH   T4, free   Hemoglobin A1c   Comprehensive metabolic panel   Lab Results  Component Value Date   TSH 0.885 11/03/2022   Lab Results  Component Value Date   HGBA1C 5.7 (H) 11/03/2022   Lab Results  Component Value Date   NA 145 (H) 11/03/2022   CL 105 11/03/2022   K 3.6 11/03/2022   CO2 24 11/03/2022   BUN 11 11/03/2022   CREATININE 0.71 11/03/2022   EGFR 100 11/03/2022   CALCIUM 9.4 11/03/2022   ALBUMIN 4.0 11/03/2022   GLUCOSE 80 11/03/2022   Lab Results  Component Value Date   ALT 19 11/03/2022   AST 19 11/03/2022   ALKPHOS 78 11/03/2022   BILITOT <0.2 11/03/2022   Lab Results  Component Value Date   CHOL 191 11/03/2022   HDL 55 11/03/2022   LDLCALC 126 (H) 11/03/2022   TRIG 54 11/03/2022   CHOLHDL 3.5 11/03/2022   The 10-year ASCVD risk score (Arnett DK, et al., 2019) is: 2.5%   Values used to calculate the score:     Age: 76 years     Sex: Female     Is Non-Hispanic African American: Yes     Diabetic: No     Tobacco smoker: No     Systolic Blood  Pressure: 120 mmHg     Is BP treated: No     HDL Cholesterol: 55 mg/dL     Total Cholesterol: 191 mg/dL  Chronic pain of left ankle Assessment & Plan: Hx of left distal fibular fracture in 02/06/2020, s/p ORIF 02/15/20 complicated with slow healing, she followed with wound clinic for a few months and chronic pain since. Handicap completed today. Wt loss may help. She can try taking Naproxen more consistently for pain management, 220 mg daily and extra one if needed. We discussed possible side effects of chronic NSAID's use. Instructed to take medication with food.   Prediabetes Assessment & Plan: HgA1C was 5.7 in 03/2021. Encouraged consistency with a healthy life style for diabetes prevention. Further recommendation will be given according to hemoglobin A1c result.  Orders: -     Hemoglobin A1c; Future  Morbid obesity [facility]) Assessment & Plan: Gained about 2-3 Lb sine 10/2021. She understands the benefits of wt loss as well as adverse effects of obesity. Consistency with healthy diet and physical activity encouraged.   Iatrogenic hypothyroidism Assessment & Plan: S/P radioactive iodine treatment Continue levothyroxine 137 mcg daily 2 tablets Wednesday, Thursdays, and Fridays and 1 tablet the rest of the week.  She will try to be more consistent taking medication weekends.  Orders: -     TSH; Future -     T4, free; Future  Hyperlipidemia, unspecified hyperlipidemia type Assessment & Plan: Non pharmacologic treatment recommended for now. Further recommendations will be given according to 10 years CVD risk score and lipid panel numbers.  Orders: -     Lipid panel; Future -     Comprehensive metabolic panel; Future  Need for influenza vaccination -  Flu vaccine trivalent PF, 6mos and older(Flulaval,Afluria,Fluarix,Fluzone)   Return in about 1 year (around 11/03/2023) for chronic problems, CPE.  I, Rolla Etienne Wierda, acting as a scribe for Anothony Bursch [place], Hull., have  documented all relevant documentation on the behalf of Lorraine Kafer [place], Hull, as directed by  Lorraine Kittleson [place], Hull while in the presence of Lorraine Klare [place], Hull.   I, Doris Gruhn [place], Hull, have reviewed all documentation for this visit. The documentation on 11/03/22 for the exam, diagnosis, procedures, and orders are all accurate and complete.  Lorraine Warn G. [place], Hull  [facility]. [place] office.

## 2022-11-03 NOTE — Patient Instructions (Signed)
A few things to remember from today's visit:  Iatrogenic hypothyroidism - Plan: T4, free, TSH  Hyperlipidemia, unspecified hyperlipidemia type - Plan: Comprehensive metabolic panel, Lipid panel  Prediabetes - Plan: Hemoglobin A1c  Chronic pain of left ankle  You can take Aleve 220 mg daily and a second dose as needed with food. No changes today.  If you need refills for medications you take chronically, please call your pharmacy. Do not use My Chart to request refills or for acute issues that need immediate attention. If you send a my chart message, it may take a few days to be addressed, specially if I am not in the office.  Please be sure medication list is accurate. If a new problem present, please set up appointment sooner than planned today.

## 2022-11-03 NOTE — Assessment & Plan Note (Signed)
Non pharmacologic treatment recommended for now. Further recommendations will be given according to 10 years CVD risk score and lipid panel numbers. 

## 2022-11-03 NOTE — Assessment & Plan Note (Signed)
HgA1C was 5.7 in 03/2021. Encouraged consistency with a healthy life style for diabetes prevention. Further recommendation will be given according to hemoglobin A1c result.

## 2022-11-04 LAB — COMPREHENSIVE METABOLIC PANEL
ALT: 19 [IU]/L (ref 0–32)
AST: 19 [IU]/L (ref 0–40)
Albumin: 4 g/dL (ref 3.8–4.9)
Alkaline Phosphatase: 78 [IU]/L (ref 44–121)
BUN/Creatinine Ratio: 15 (ref 9–23)
BUN: 11 mg/dL (ref 6–24)
Bilirubin Total: <0.2 mg/dL (ref 0.0–1.2)
CO2: 24 mmol/L (ref 20–29)
Calcium: 9.4 mg/dL (ref 8.7–10.2)
Chloride: 105 mmol/L (ref 96–106)
Creatinine, Ser: 0.71 mg/dL (ref 0.57–1.00)
Globulin, Total: 3 g/dL (ref 1.5–4.5)
Glucose: 80 mg/dL (ref 70–99)
Potassium: 3.6 mmol/L (ref 3.5–5.2)
Sodium: 145 mmol/L — ABNORMAL HIGH (ref 134–144)
Total Protein: 7 g/dL (ref 6.0–8.5)
eGFR: 100 mL/min/{1.73_m2} (ref >59)

## 2022-11-04 LAB — LIPID PANEL
Chol/HDL Ratio: 3.5 {ratio} (ref 0.0–4.4)
Cholesterol, Total: 191 mg/dL (ref 100–199)
HDL: 55 mg/dL (ref >39)
LDL Chol Calc (NIH): 126 mg/dL — ABNORMAL HIGH (ref 0–99)
Triglycerides: 54 mg/dL (ref 0–149)
VLDL Cholesterol Cal: 10 mg/dL (ref 5–40)

## 2022-11-04 LAB — TSH: TSH: 0.885 u[IU]/mL (ref 0.450–4.500)

## 2022-11-04 LAB — T4, FREE: Free T4: 1.66 ng/dL (ref 0.82–1.77)

## 2022-11-04 LAB — HEMOGLOBIN A1C
Est. average glucose Bld gHb Est-mCnc: 117 mg/dL
Hgb A1c MFr Bld: 5.7 % — ABNORMAL HIGH (ref 4.8–5.6)

## 2022-11-04 MED ORDER — LEVOTHYROXINE SODIUM 137 MCG PO TABS: ORAL_TABLET | ORAL | 3 refills | Status: DC

## 2022-12-07 ENCOUNTER — Telehealth: Payer: Self-pay | Admitting: Family Medicine

## 2022-12-07 NOTE — Telephone Encounter (Signed)
Patient dropped off document  Medical Evaluation , to be filled out by provider. Patient requested to send it back via Call Patient to pick up within 7-days. Document is located in providers tray at front office.Please advise at Mobile [phone] (mobile)

## 2022-12-09 NOTE — Telephone Encounter (Signed)
In your bin to be signed.  

## 2022-12-09 NOTE — Telephone Encounter (Signed)
Form is completed, copy sent to scan & original is up front to be picked up. I left patient a voicemail letting her know.

## 2023-04-14 ENCOUNTER — Encounter: Payer: Self-pay | Admitting: Family Medicine

## 2023-04-14 ENCOUNTER — Ambulatory Visit: Admitting: Family Medicine

## 2023-04-14 VITALS — BP 130/80 | HR 100 | Resp 16 | Ht 67.0 in | Wt 276.1 lb

## 2023-04-14 DIAGNOSIS — Z23 Encounter for immunization: Secondary | ICD-10-CM

## 2023-04-14 DIAGNOSIS — Z6841 Body Mass Index (BMI) 40.0 and over, adult: Secondary | ICD-10-CM | POA: Diagnosis not present

## 2023-04-14 DIAGNOSIS — M170 Bilateral primary osteoarthritis of knee: Secondary | ICD-10-CM

## 2023-04-14 DIAGNOSIS — R030 Elevated blood-pressure reading, without diagnosis of hypertension: Secondary | ICD-10-CM

## 2023-04-14 NOTE — Patient Instructions (Addendum)
 A few things to remember from today's visit:  Elevated blood pressure reading  Osteoarthritis of both knees, unspecified osteoarthritis type  Avoid Ibuprofen or Naproxen. Take blood pressure in the morning after emptying your bladder and at night before dinner and let me know about readings in 2 weeks. Start decreasing sweets, sugar added foods,and increase physical activity as tolerated.  If you need refills for medications you take chronically, please call your pharmacy. Do not use My Chart to request refills or for acute issues that need immediate attention. If you send a my chart message, it may take a few days to be addressed, specially if I am not in the office.  Please be sure medication list is accurate. If a new problem present, please set up appointment sooner than planned today.

## 2023-04-14 NOTE — Assessment & Plan Note (Addendum)
 She has lost some weight since 11/2022. We discussed the benefits of wt loss as well as adverse effects of obesity. Consistency with healthy diet and physical activity encouraged.

## 2023-04-14 NOTE — Assessment & Plan Note (Signed)
 We discussed diagnostic criteria. Today BP in the office adequate. Recommend monitoring BP at home, ideally it should be 120s/70s. Instructed to let me know about BP's in 2 weeks.

## 2023-04-14 NOTE — Progress Notes (Signed)
 ACUTE VISIT Chief Complaint  Patient presents with   Hypertension   HPI: [name] is a 58 y.o. female with a PMHx significant for hypothyroidism, chronic pain, prediabetes, and obesity, who is here today complaining of elevated blood pressure.   She states she wasn't feeling well on the night of 3/9 and the morning of 3/10. She had her BP checked at work and it was 160/100.  She does not check her BP regularly at home.  When she wasn't feeling well on Monday, she was having dizziness and lightheadedness. She mentions she had had two donuts shortly before the symptom onset.  She has a FMHx of HTN in several of her family members.  Denies headaches, visual changes, chest pain, SOB, palpitations, or worsening swelling.   Lab Results  Component Value Date   NA 145 (H) 11/03/2022   CL 105 11/03/2022   K 3.6 11/03/2022   CO2 24 11/03/2022   BUN 11 11/03/2022   CREATININE 0.71 11/03/2022   EGFR 100 11/03/2022   CALCIUM 9.4 11/03/2022   ALBUMIN 4.0 11/03/2022   GLUCOSE 80 11/03/2022   Lab Results  Component Value Date   TSH 0.885 11/03/2022   She states that she is trying to do better with following dietary recommendations. She cannot exercise regularly due to knee pain.  -She would also like an extension of her handicap paperwork.  Bilateral knee OA. She is following with orthopedics and it has been a discussion about total knee replacement, bilateral. She takes ibuprofen occasionally. Pain is aggravated by prolonged walking, going up and down stairs, and prolonged standing. Currently she is getting steroid knee injections every 3 months or so.  Review of Systems  Constitutional:  Negative for activity change, appetite change and fever.  HENT:  Negative for nosebleeds and sore throat.   Respiratory:  Negative for cough and wheezing.   Gastrointestinal:  Negative for abdominal pain, nausea and vomiting.  Genitourinary:  Negative for decreased urine volume and  hematuria.  Neurological:  Negative for syncope and facial asymmetry.  See other pertinent positives and negatives in HPI.  Current Outpatient Medications on File Prior to Visit  Medication Sig Dispense Refill   acetaminophen (TYLENOL) 500 MG tablet Take 500 mg by mouth every 6 (six) hours as needed for mild pain, fever or headache.     ibuprofen (ADVIL) 200 MG tablet Take by mouth as needed.     levothyroxine (SYNTHROID) 137 MCG tablet TAKE 1 TABLET BY MOUTH TWICE DAILY ON WEDNESDAY, THURSDAY, AND FRIDAY, AND 1 TABLET DAILY ALL OTHER DAYS OF THE WEEK 128 tablet 3   lidocaine (XYLOCAINE) 5 % ointment Apply 1 Application topically as needed. 35.44 g 0   NONFORMULARY OR COMPOUNDED ITEM Aspercream- use as needed     VITAMIN D PO Take 1 capsule by mouth daily.     VITAMIN E PO Take 1 tablet by mouth daily.     No current facility-administered medications on file prior to visit.    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    Lower extremities   Anemia    past hx    Arthritis    KNEE,ANKLE   Asthma    childhood   Hypothyroidism    IBS (irritable bowel syndrome)    Multinodular goiter    s/p radioactive I131 ablation, Iatogenic Hypothyroidism   Obesity    Personal history of goiter    with radioactive iodine ablation  Allergies  Allergen Reactions   Sulfamethoxazole-Trimethoprim Hives   Sulfonamide Derivatives Hives    Social History   Socioeconomic History   Marital status: Single    Spouse name: Not on file   Number of children: 0   Years of education: 16   Highest education level: Not on file  Occupational History   Occupation: CLINICAL CHEMIST    Employer: [employer]  Tobacco Use   Smoking status: Never   Smokeless tobacco: Never  Vaping Use   Vaping status: Never Used  Substance and Sexual Activity   Alcohol use: No   Drug use: No   Sexual activity: Not Currently    Birth control/protection: None   Other Topics Concern   Not on file  Social History Narrative   Not on file   Social Drivers of Health   Financial Resource Strain: Not on file  Food Insecurity: Not on file  Transportation Needs: Not on file  Physical Activity: Not on file  Stress: Not on file  Social Connections: Not on file    Vitals:   04/14/23 1512 04/14/23 1533  BP: 132/80 130/80  Pulse: 100   Resp: 16   SpO2: 98%    Wt Readings from Last 3 Encounters:  04/14/23 276 lb 2 oz (125.2 kg)  11/03/22 284 lb 4 oz (128.9 kg)  03/03/22 278 lb (126.1 kg)   Body mass index is 43.25 kg/m.  Physical Exam Vitals and nursing note reviewed.  Constitutional:      General: She is not in acute distress.    Appearance: She is well-developed.  HENT:     Head: Normocephalic and atraumatic.     Mouth/Throat:     Mouth: Mucous membranes are moist.     Pharynx: Oropharynx is clear.  Eyes:     Conjunctiva/sclera: Conjunctivae normal.  Cardiovascular:     Rate and Rhythm: Normal rate and regular rhythm.     Pulses:          Posterior tibial pulses are 2+ on the right side and 2+ on the left side.     Heart sounds: No murmur heard. Pulmonary:     Effort: Pulmonary effort is normal. No respiratory distress.     Breath sounds: Normal breath sounds.  Abdominal:     Palpations: Abdomen is soft. There is no mass.     Tenderness: There is no abdominal tenderness.  Musculoskeletal:     Right lower leg: 1+ Pitting Edema present.     Left lower leg: 1+ Pitting Edema present.  Lymphadenopathy:     Cervical: No cervical adenopathy.  Skin:    General: Skin is warm.     Findings: No erythema or rash.  Neurological:     General: No focal deficit present.     Mental Status: She is alert and oriented to person, place, and time.     Comments: Antalgic gait, not assisted.  Psychiatric:        Mood and Affect: Mood and affect normal.   ASSESSMENT AND PLAN:  [name] was seen today for elevated blood pressure.    Morbid obesity with body mass index (BMI) of 40.0 to 44.9 in adult [facility]) Assessment & Plan: She has lost some weight since 11/2022. We discussed the benefits of wt loss as well as adverse effects of obesity. Consistency with healthy diet and physical activity encouraged.   Elevated blood pressure reading Assessment & Plan: We discussed diagnostic criteria. Today BP in the office adequate. Recommend  monitoring BP at home, ideally it should be 120s/70s. Instructed to let me know about BP's in 2 weeks.   Osteoarthritis of both knees, unspecified osteoarthritis type Assessment & Plan: Currently she is following with orthopedics, planning on bilateral knee replacement. Weight loss will help with progression. Most likely she needs BMI < 40 in order to have surgery. Handicap sticker provided.   Need for pneumococcal vaccination -     Pneumococcal conjugate vaccine 20-valent  Need for shingles vaccine -     Varicella-zoster vaccine IM   Return if symptoms worsen or fail to improve, for keep next appointment.  I, Rolla Etienne Wierda, acting as a scribe for Keneth Borg [place], MD., have documented all relevant documentation on the behalf of Lorraine Henery [place], MD, as directed by  Jorgia Manthei [place], MD while in the presence of Larraine Argo [place], MD.   I, Lorrin Nawrot [place], MD, have reviewed all documentation for this visit. The documentation on 04/14/23 for the exam, diagnosis, procedures, and orders are all accurate and complete.  Darlene Brozowski G. [place], MD  [facility]. [place] office.

## 2023-04-14 NOTE — Assessment & Plan Note (Signed)
 Currently she is following with orthopedics, planning on bilateral knee replacement. Weight loss will help with progression. Most likely she needs BMI < 40 in order to have surgery. Handicap sticker provided.

## 2023-05-04 ENCOUNTER — Encounter: Payer: 59 | Admitting: Obstetrics and Gynecology

## 2023-08-14 ENCOUNTER — Other Ambulatory Visit: Payer: Self-pay | Admitting: Family Medicine

## 2023-08-14 DIAGNOSIS — E032 Hypothyroidism due to medicaments and other exogenous substances: Secondary | ICD-10-CM

## 2023-08-24 ENCOUNTER — Other Ambulatory Visit: Payer: Self-pay | Admitting: Family Medicine

## 2023-08-24 DIAGNOSIS — Z1231 Encounter for screening mammogram for malignant neoplasm of breast: Secondary | ICD-10-CM

## 2023-10-12 ENCOUNTER — Ambulatory Visit
Admission: RE | Admit: 2023-10-12 | Discharge: 2023-10-12 | Disposition: A | Discharge status: Home / Self Care | Source: Ambulatory Visit | Attending: Family Medicine | Admitting: Family Medicine

## 2023-10-12 DIAGNOSIS — Z1231 Encounter for screening mammogram for malignant neoplasm of breast: Secondary | ICD-10-CM

## 2024-01-05 ENCOUNTER — Ambulatory Visit (INDEPENDENT_AMBULATORY_CARE_PROVIDER_SITE_OTHER)

## 2024-01-05 DIAGNOSIS — Z23 Encounter for immunization: Secondary | ICD-10-CM | POA: Diagnosis not present

## 2024-01-05 NOTE — Progress Notes (Signed)
 Patient is in office today for a nurse visit for Immunization. Patient Injection was given in the  Left deltoid. Patient tolerated injection well.

## 2024-01-05 NOTE — Addendum Note (Signed)
 Addended by: ELNER NANNY B on: 01/05/2024 02:56 PM   Modules accepted: Orders
